# Patient Record
Sex: Female | Born: 1950 | Race: White | Hispanic: No | Marital: Married | Smoking: Never smoker
Health system: Southern US, Community
[De-identification: ages and names within clinical notes are randomized; demographics above are authoritative.]

## PROBLEM LIST (undated history)

## (undated) DIAGNOSIS — E669 Obesity, unspecified: Secondary | ICD-10-CM

## (undated) DIAGNOSIS — J45909 Unspecified asthma, uncomplicated: Secondary | ICD-10-CM

## (undated) DIAGNOSIS — R011 Cardiac murmur, unspecified: Secondary | ICD-10-CM

## (undated) DIAGNOSIS — I10 Essential (primary) hypertension: Secondary | ICD-10-CM

## (undated) DIAGNOSIS — R809 Proteinuria, unspecified: Secondary | ICD-10-CM

## (undated) HISTORY — PX: WISDOM TOOTH EXTRACTION: SHX21

## (undated) HISTORY — PX: BREAST EXCISIONAL BIOPSY: SUR124

## (undated) HISTORY — PX: BUNIONECTOMY: SHX129

## (undated) HISTORY — DX: Proteinuria, unspecified: R80.9

## (undated) HISTORY — DX: Obesity, unspecified: E66.9

## (undated) HISTORY — DX: Cardiac murmur, unspecified: R01.1

## (undated) HISTORY — DX: Essential (primary) hypertension: I10

## (undated) HISTORY — DX: Unspecified asthma, uncomplicated: J45.909

---

## 1992-02-11 HISTORY — PX: FRACTURE SURGERY: SHX138

## 1997-02-10 HISTORY — PX: EYE SURGERY: SHX253

## 2001-02-10 HISTORY — PX: ELBOW SURGERY: SHX618

## 2006-10-06 ENCOUNTER — Encounter: Admission: RE | Admit: 2006-10-06 | Discharge: 2006-10-06 | Payer: Self-pay | Admitting: Emergency Medicine

## 2007-07-23 ENCOUNTER — Emergency Department (HOSPITAL_COMMUNITY): Admission: EM | Admit: 2007-07-23 | Discharge: 2007-07-23 | Payer: Self-pay | Admitting: Emergency Medicine

## 2007-08-18 ENCOUNTER — Ambulatory Visit: Payer: Self-pay | Admitting: Cardiology

## 2007-08-26 ENCOUNTER — Ambulatory Visit: Payer: Self-pay

## 2007-08-26 ENCOUNTER — Encounter: Payer: Self-pay | Admitting: Cardiology

## 2008-06-26 ENCOUNTER — Encounter (INDEPENDENT_AMBULATORY_CARE_PROVIDER_SITE_OTHER): Payer: Self-pay | Admitting: *Deleted

## 2008-09-19 ENCOUNTER — Encounter: Payer: Self-pay | Admitting: Cardiology

## 2008-09-19 ENCOUNTER — Ambulatory Visit: Payer: Self-pay

## 2008-10-25 DIAGNOSIS — R011 Cardiac murmur, unspecified: Secondary | ICD-10-CM | POA: Insufficient documentation

## 2008-10-25 DIAGNOSIS — J45909 Unspecified asthma, uncomplicated: Secondary | ICD-10-CM | POA: Insufficient documentation

## 2008-10-25 DIAGNOSIS — R079 Chest pain, unspecified: Secondary | ICD-10-CM | POA: Insufficient documentation

## 2008-10-25 DIAGNOSIS — E785 Hyperlipidemia, unspecified: Secondary | ICD-10-CM | POA: Insufficient documentation

## 2008-10-25 DIAGNOSIS — E1169 Type 2 diabetes mellitus with other specified complication: Secondary | ICD-10-CM | POA: Insufficient documentation

## 2008-10-25 DIAGNOSIS — K219 Gastro-esophageal reflux disease without esophagitis: Secondary | ICD-10-CM | POA: Insufficient documentation

## 2008-10-25 DIAGNOSIS — I10 Essential (primary) hypertension: Secondary | ICD-10-CM | POA: Insufficient documentation

## 2008-10-26 ENCOUNTER — Ambulatory Visit: Payer: Self-pay | Admitting: Cardiology

## 2008-10-26 DIAGNOSIS — I359 Nonrheumatic aortic valve disorder, unspecified: Secondary | ICD-10-CM | POA: Insufficient documentation

## 2009-08-29 ENCOUNTER — Encounter (INDEPENDENT_AMBULATORY_CARE_PROVIDER_SITE_OTHER): Payer: Self-pay | Admitting: *Deleted

## 2009-09-06 ENCOUNTER — Encounter (INDEPENDENT_AMBULATORY_CARE_PROVIDER_SITE_OTHER): Payer: Self-pay | Admitting: *Deleted

## 2009-09-10 ENCOUNTER — Ambulatory Visit: Payer: Self-pay | Admitting: Gastroenterology

## 2009-09-13 ENCOUNTER — Encounter: Admission: RE | Admit: 2009-09-13 | Discharge: 2009-09-13 | Payer: Self-pay | Admitting: Emergency Medicine

## 2009-09-19 ENCOUNTER — Encounter: Admission: RE | Admit: 2009-09-19 | Discharge: 2009-09-19 | Payer: Self-pay | Admitting: Emergency Medicine

## 2009-09-21 ENCOUNTER — Ambulatory Visit: Payer: Self-pay | Admitting: Gastroenterology

## 2009-09-21 LAB — HM COLONOSCOPY

## 2009-11-05 ENCOUNTER — Encounter: Admission: RE | Admit: 2009-11-05 | Discharge: 2009-11-05 | Payer: Self-pay | Admitting: Surgery

## 2009-11-05 ENCOUNTER — Ambulatory Visit (HOSPITAL_COMMUNITY): Admission: RE | Admit: 2009-11-05 | Discharge: 2009-11-05 | Payer: Self-pay | Admitting: Surgery

## 2010-03-14 NOTE — Miscellaneous (Signed)
Summary: [facility]  Clinical Lists Changes  Medications: Added new medication of MOVIPREP 100 GM  SOLR (PEG-KCL-NACL-NASULF-NA ASC-C) As per prep instructions. - Signed Rx of MOVIPREP 100 GM  SOLR (PEG-KCL-NACL-NASULF-NA ASC-C) As per prep instructions.;  #1 x 0;  Signed;  Entered by: Ezra Sites RN;  Authorized by: Mardella Layman MD [facility];  Method used: Electronically to [facility]. [phone]*, [address], [place], [place], [place]  [address], Ph: [phone], Fax: [phone] Allergies: Added new allergy or adverse reaction of CODEINE Observations: Added new observation of NKA: F (09/10/2009 12:42)    Prescriptions: MOVIPREP 100 GM  SOLR (PEG-KCL-NACL-NASULF-NA ASC-C) As per prep instructions.  #1 x 0   Entered by:   Ezra Sites RN   Authorized by:   Mardella Layman MD [facility]   Signed by:   Ezra Sites RN on 09/10/2009   Method used:   Electronically to        [facility]. [phone]* (retail)       [address]       [place], [place]  [address]       Ph: [phone]       Fax: [phone]   RxID:   [id number]

## 2010-03-14 NOTE — Letter (Signed)
Summary: Previsit letter  [facility]  [address] [place], [place] [address]   Phone: [phone]  Fax: [phone]       08/29/2009 MRN: [id number]  [facility] [address] [place], [place]  [address]  Dear Ms. Hagan,  Welcome to the Gastroenterology Division at [facility].    You are scheduled to see a nurse for your pre-procedure visit on 09/10/2009 at 1:00PM on the 3rd floor at [facility], [address]  We ask that you try to arrive at our office 15 minutes prior to your appointment time to allow for check-in.  Your nurse visit will consist of discussing your medical and surgical history, your immediate family medical history, and your medications.    Please bring a complete list of all your medications or, if you prefer, bring the medication bottles and we will list them.  We will need to be aware of both prescribed and over the counter drugs.  We will need to know exact dosage information as well.  If you are on blood thinners (Coumadin, Plavix, Aggrenox, Ticlid, etc.) please call our office today/prior to your appointment, as we need to consult with your physician about holding your medication.   Please be prepared to read and sign documents such as consent forms, a financial agreement, and acknowledgement forms.  If necessary, and with your consent, a friend or relative is welcome to sit-in on the nurse visit with you.  Please bring your insurance card so that we may make a copy of it.  If your insurance requires a referral to see a specialist, please bring your referral form from your primary care physician.  No co-pay is required for this nurse visit.     If you cannot keep your appointment, please call [phone] to cancel or reschedule prior to your appointment date.  This allows [place] the opportunity to schedule an appointment for another patient in need of care.    Thank you for choosing [facility] for your medical needs.  We  appreciate the opportunity to care for you.  Please visit [place] at our website  to learn more about our practice.                     Sincerely.                                                                                                                   The Gastroenterology Division

## 2010-03-14 NOTE — Letter (Signed)
Summary: [facility] Instructions  [facility]  [address] [place], [place] [address]   Phone: [phone]  Fax: [phone]       [facility]    1950/10/27    MRN: [id number]        Procedure Day /Date:  Friday 09/21/2009     Arrival Time: 1:00 pm      Procedure Time: 2:00 pm     Location of Procedure:                    _x _  [facility] (4th Floor)                        PREPARATION FOR COLONOSCOPY WITH MOVIPREP   Starting 5 days prior to your procedure Sunday 8/7  do not eat nuts, seeds, popcorn, corn, beans, peas,  salads, or any raw vegetables.  Do not take any fiber supplements (e.g. Metamucil, Citrucel, and Benefiber).  THE DAY BEFORE YOUR PROCEDURE         DATE: Thursday 8/11  1.  Drink clear liquids the entire day-NO SOLID FOOD  2.  Do not drink anything colored red or purple.  Avoid juices with pulp.  No orange juice.  3.  Drink at least 64 oz. (8 glasses) of fluid/clear liquids during the day to prevent dehydration and help the prep work efficiently.  CLEAR LIQUIDS INCLUDE: Water Jello Ice Popsicles Tea (sugar ok, no milk/cream) Powdered fruit flavored drinks Coffee (sugar ok, no milk/cream) Gatorade Juice: apple, white grape, white cranberry  Lemonade Clear bullion, consomm, broth Carbonated beverages (any kind) Strained chicken noodle soup Hard Candy                             4.  In the morning, mix first dose of MoviPrep solution:    Empty 1 Pouch A and 1 Pouch B into the disposable container    Add lukewarm drinking water to the top line of the container. Mix to dissolve    Refrigerate (mixed solution should be used within 24 hrs)  5.  Begin drinking the prep at 5:00 p.m. The MoviPrep container is divided by 4 marks.   Every 15 minutes drink the solution down to the next mark (approximately 8 oz) until the full liter is complete.   6.  Follow completed prep with 16 oz of clear liquid of your choice (Nothing red  or purple).  Continue to drink clear liquids until bedtime.  7.  Before going to bed, mix second dose of MoviPrep solution:    Empty 1 Pouch A and 1 Pouch B into the disposable container    Add lukewarm drinking water to the top line of the container. Mix to dissolve    Refrigerate  THE DAY OF YOUR PROCEDURE      DATE: Friday 8/12  Beginning at 9:00 a.m. (5 hours before procedure):         1. Every 15 minutes, drink the solution down to the next mark (approx 8 oz) until the full liter is complete.  2. Follow completed prep with 16 oz. of clear liquid of your choice.    3. You may drink clear liquids until 12:00 pm (2 HOURS BEFORE PROCEDURE).   MEDICATION INSTRUCTIONS  Unless otherwise instructed, you should take regular prescription medications with a small sip of water   as early as possible the morning  of your procedure.   Additional medication instructions:  Do not take Atenolol/HCTZ day of procedure.         OTHER INSTRUCTIONS  You will need a responsible adult at least 58 years of age to accompany you and drive you home.   This person must remain in the waiting room during your procedure.  Wear loose fitting clothing that is easily removed.  Leave jewelry and other valuables at home.  However, you may wish to bring a book to read or  an iPod/MP3 player to listen to music as you wait for your procedure to start.  Remove all body piercing jewelry and leave at home.  Total time from sign-in until discharge is approximately 2-3 hours.  You should go home directly after your procedure and rest.  You can resume normal activities the  day after your procedure.  The day of your procedure you should not:   Drive   Make legal decisions   Operate machinery   Drink alcohol   Return to work  You will receive specific instructions about eating, activities and medications before you leave.    The above instructions have been reviewed and explained to me by    Ezra Sites RN  September 10, 2009 1:05 PM    I fully understand and can verbalize these instructions _____________________________ Date _________

## 2010-03-14 NOTE — Procedures (Signed)
Summary: Colonoscopy  Patient: Randy Hainline Note: All result statuses are Final unless otherwise noted.  Tests: (1) Colonoscopy (COL)   COL Colonoscopy           DONE     [facility]     [address]     [place], [place]  [address]           COLONOSCOPY PROCEDURE REPORT           PATIENT:  Monette, Arleatha  MR#:  [id number]     BIRTHDATE:  [birth year], 59 yrs. old  GENDER:  female     ENDOSCOPIST:  Vania Rea. Jarold Motto, MD, [facility]     REF. BY:  Robert Bellow, M.D.     PROCEDURE DATE:  09/21/2009     PROCEDURE:  Average-risk screening colonoscopy     G0121     ASA CLASS:  Class II     INDICATIONS:  Routine Risk Screening     MEDICATIONS:   Fentanyl 75 mcg IV, Versed 9 mg IV           DESCRIPTION OF PROCEDURE:   After the risks benefits and     alternatives of the procedure were thoroughly explained, informed     consent was obtained.  Digital rectal exam was performed and     revealed no abnormalities.   The LB CF-H180AL [id number] endoscope     was introduced through the anus and advanced to the cecum, which     was identified by both the appendix and ileocecal valve, without     limitations.  The quality of the prep was excellent, using     MoviPrep.  The instrument was then slowly withdrawn as the colon     was fully examined.     <<PROCEDUREIMAGES>>           FINDINGS:  There were mild diverticular changes in left colon.     diverticulosis was found.  No polyps or cancers were seen.  This     was otherwise a normal examination of the colon.   Retroflexed     views in the rectum revealed no abnormalities.    The scope was     then withdrawn from the patient and the procedure completed.           COMPLICATIONS:  None     ENDOSCOPIC IMPRESSION:     1) Diverticulosis,mild,left sided diverticulosis     2) No polyps or cancers     3) Otherwise normal examination     RECOMMENDATIONS:     1) high fiber diet     2) Continue current colorectal screening recommendations for  "routine risk" patients with a repeat colonoscopy in 10 years.     REPEAT EXAM:  No           ______________________________     Vania Rea. Jarold Motto, MD, [facility]           CC:           n.     eSIGNED:   Vania Rea. Patterson at 09/21/2009 02:44 PM           Arenas, Saidah, [place] [id number]  Note: An exclamation mark (!) indicates a result that was not dispersed into the flowsheet. Document Creation Date: 09/21/2009 2:45 PM _______________________________________________________________________  (1) Order result status: Final Collection or observation date-time: 09/21/2009 14:40 Requested date-time:  Receipt date-time:  Reported date-time:  Referring Physician:   Ordering Physician: Sheryn Bison [phone]) Specimen  Source:  Source: Launa Grill Order Number: [id number] Lab site:   Appended Document: Colonoscopy    Clinical Lists Changes  Observations: Added new observation of COLONNXTDUE: 09/2019 (09/21/2009 16:12)

## 2010-04-25 LAB — SURGICAL PCR SCREEN: MRSA, PCR: NEGATIVE

## 2010-04-25 LAB — DIFFERENTIAL
Eosinophils Relative: 1 % (ref 0–5)
Lymphocytes Relative: 25 % (ref 12–46)
Lymphs Abs: 2.4 10*3/uL (ref 0.7–4.0)
Monocytes Absolute: 0.9 10*3/uL (ref 0.1–1.0)
Monocytes Relative: 9 % (ref 3–12)

## 2010-04-25 LAB — BASIC METABOLIC PANEL
BUN: 17 mg/dL (ref 6–23)
CO2: 30 mEq/L (ref 19–32)
Calcium: 10 mg/dL (ref 8.4–10.5)
GFR calc Af Amer: >60 mL/min (ref >60)
GFR calc non Af Amer: >60 mL/min (ref >60)
Glucose, Bld: 137 mg/dL — ABNORMAL HIGH (ref 70–99)
Potassium: 3.6 mEq/L (ref 3.5–5.1)
Sodium: 142 mEq/L (ref 135–145)

## 2010-04-25 LAB — CBC
HCT: 41.8 % (ref 36.0–46.0)
Hemoglobin: 13.7 g/dL (ref 12.0–15.0)
MCH: 30 pg (ref 26.0–34.0)
WBC: 9.8 10*3/uL (ref 4.0–10.5)

## 2010-06-25 NOTE — Assessment & Plan Note (Signed)
[facility]                            CARDIOLOGY OFFICE NOTE   NAME:Kennedy, Tracy ZAVALETA                         MRN:          [id number]  DATE:08/18/2007                            DOB:          12-01-1950    HISTORY:  The patient is a 60 year old female whom I am asked to  evaluate for chest pain.  She has no prior cardiac history.  She does  have a long history of indigestion by her report.  She was seen in the  [facility] emergency room on July 23, 2007.  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  The pain was  substernal in location.  Note, it had originally began 2 days earlier in  the right breast area and radiated over to the substernal area.  It was  described as between a sharp and a dull sensation.  It did not radiate.  It was not pleuritic or positional nor is it related to food.  There was  a question of increase with exertion.  There was no associated nausea,  vomiting, shortness of breath, or diaphoresis.  It worsened on the day  that she was seen in the emergency room and she therefore presented to  the hospital.  At that time, her electrocardiogram showed a sinus rhythm  with no ST changes.  She had chest x-ray that showed no acute disease.  She had a set of cardiac markers that were normal.  Note, her hemoglobin  was normal at 13.1.  She had a brief episode of chest pain since then.  However, she recently traveled to the [facility] and was walking and  did not have significant chest pain.  Note, she occasionally has dyspnea  on exertion, but there is no orthopnea, PND, or pedal edema.  She has  not had palpitations or syncope.  Because of the above, we were asked to  further evaluate.   MEDICATIONS:  1. Potassium 20 mEq p.o. b.i.d.  2. Atenolol.  3. Chlorthalidone 50/25 mg tablets 1 p.o. daily.  4. Lisinopril 20 mg p.o. daily.  5. Lipitor 20 mg p.o. daily.   ALLERGIES:  CODEINE.   Note, she also uses  Ventolin as needed.   SOCIAL HISTORY:  She does not smoke and only rarely consumes alcohol.   FAMILY HISTORY:  Negative for coronary artery disease in her immediate  family.  Her mother did have congestive heart failure.   PAST MEDICAL HISTORY:  Significant for:  1. Hypertension.  2. Hyperlipidemia.  3. She states she was told she had borderline diabetes in the past,      but not recently.  4. She has a history of asthma.  5. There is also a history of reflux.  6. She has had prior surgery on her elbow and ankle.  7. She also has a history of allergies.   REVIEW OF SYSTEMS:  She denies any headaches, fevers, or chills.  There  is no productive cough or hemoptysis.  There is no dysphagia,  odynophagia, melena, or hematochezia.  There is no dysuria or hematuria.  There are no rashes or seizure activity.  There is no orthopnea, PND, or  pedal edema.  The remaining systems are negative.   PHYSICAL EXAMINATION:  VITAL SIGNS:  Today, shows a blood pressure of  149/85 and pulse is 69.  She weighs 215 pounds.  GENERAL:  She is well developed and obese.  She is in no acute distress  at present.  She does not appear to be depressed.  SKIN:  Warm and dry.  BACK:  Normal.  HEENT:  Normal with normal eyelids.  NECK:  Supple with a normal upstroke bilaterally and could not  appreciate bruits.  There is no jugular venous distention and no  thyromegaly.  CHEST:  Clear to auscultation with normal expansion.  CARDIOVASCULAR:  Regular rate and rhythm with normal S1 and S2.  There  is a 2/6 systolic murmur at the left sternal border.  There is no change  with Valsalva.  ABDOMEN:  Nontender and nondistended.  Positive bowel sounds.  No  hepatosplenomegaly and no masses appreciated.  There is no abdominal  bruit.  EXTREMITIES:  She has 2+ femoral pulses bilaterally and no bruits.  Extremities show no edema, and I can palpate no cords.  She has 2+  posterior tibial pulses bilaterally.  There is no  peripheral clubbing.  NEUROLOGIC:  Grossly intact.   Her electrocardiogram shows a sinus rhythm at a rate of 71.  The axis is  normal.  There are no significant ST changes.   DIAGNOSES:  1. Atypical chest pain - Mrs. Eggebrecht's symptoms are somewhat atypical.      Certainly, they could be related to reflux.  Apparently, she has a      long history of this.  However, she also has risk factors and we      will plan to proceed with a stress Myoview.  If it shows no      ischemia then we will not pursue further evaluation at this point.      She may need Nexium or Protonix in the future, but I will leave      this to her primary care physician.  2. Murmur - we will check an echocardiogram to rule out aortic      stenosis.  This sounds potentially be an ejection murmur.  3. Hypertension - blood pressure is mildly elevated today.  She will      track this and her primary care physician can increase her      medications as indicated.  4. Hyperlipidemia - she will follow up with her primary care physician      for this.  5. History of asthma.   I will see her back on an as needed basis pending the results of her  above studies.     Madolyn Frieze Jens Som, MD, [facility]  Electronically Signed    BSC/MedQ  DD: 08/18/2007  DT: 08/19/2007  Job #: [id number]   cc:   Emergency Room At [facility]

## 2010-08-07 ENCOUNTER — Telehealth: Payer: Self-pay | Admitting: Cardiology

## 2010-08-07 NOTE — Telephone Encounter (Signed)
All Cardiac faxed to [facility] @ [phone]  08/07/10/km

## 2010-11-07 LAB — BASIC METABOLIC PANEL WITH GFR
BUN: 14
Calcium: 9.2
Creatinine, Ser: 0.62
GFR calc Af Amer: >60
GFR calc non Af Amer: >60
Glucose, Bld: 82
Potassium: 3.5
Sodium: 142

## 2010-11-07 LAB — DIFFERENTIAL
Basophils Absolute: 0
Basophils Relative: 1
Eosinophils Absolute: 0.2
Eosinophils Relative: 2
Lymphocytes Relative: 32
Lymphs Abs: 2.9
Monocytes Absolute: 0.8
Monocytes Relative: 8
Neutro Abs: 5.2
Neutrophils Relative %: 57

## 2010-11-07 LAB — CBC
HCT: 38.7
Hemoglobin: 13.1
MCHC: 33.7
MCV: 90.2
Platelets: 262
RBC: 4.29
RDW: 13.2
WBC: 9.1

## 2010-11-07 LAB — POCT CARDIAC MARKERS
CKMB, poc: 2.7
Myoglobin, poc: 89.1
Operator id: 234501
Troponin i, poc: <0.05

## 2010-11-07 LAB — BASIC METABOLIC PANEL
CO2: 27
Chloride: 104

## 2011-02-11 HISTORY — PX: EYE SURGERY: SHX253

## 2011-02-25 ENCOUNTER — Encounter: Payer: Self-pay | Admitting: Physician Assistant

## 2011-02-25 DIAGNOSIS — I10 Essential (primary) hypertension: Secondary | ICD-10-CM | POA: Insufficient documentation

## 2011-02-25 DIAGNOSIS — E1129 Type 2 diabetes mellitus with other diabetic kidney complication: Secondary | ICD-10-CM | POA: Insufficient documentation

## 2011-02-25 DIAGNOSIS — J45909 Unspecified asthma, uncomplicated: Secondary | ICD-10-CM | POA: Insufficient documentation

## 2011-04-08 ENCOUNTER — Ambulatory Visit (INDEPENDENT_AMBULATORY_CARE_PROVIDER_SITE_OTHER): Payer: BC Managed Care – PPO | Admitting: Physician Assistant

## 2011-04-08 ENCOUNTER — Encounter: Payer: Self-pay | Admitting: Physician Assistant

## 2011-04-08 DIAGNOSIS — E119 Type 2 diabetes mellitus without complications: Secondary | ICD-10-CM

## 2011-04-08 DIAGNOSIS — I10 Essential (primary) hypertension: Secondary | ICD-10-CM

## 2011-04-08 DIAGNOSIS — E669 Obesity, unspecified: Secondary | ICD-10-CM

## 2011-04-08 DIAGNOSIS — E785 Hyperlipidemia, unspecified: Secondary | ICD-10-CM

## 2011-04-08 DIAGNOSIS — E782 Mixed hyperlipidemia: Secondary | ICD-10-CM

## 2011-04-08 DIAGNOSIS — Z79899 Other long term (current) drug therapy: Secondary | ICD-10-CM

## 2011-04-08 LAB — COMPREHENSIVE METABOLIC PANEL
ALT: 25 U/L (ref 0–35)
CO2: 27 mEq/L (ref 19–32)
Calcium: 9.9 mg/dL (ref 8.4–10.5)
Chloride: 105 mEq/L (ref 96–112)
Creat: 0.64 mg/dL (ref 0.50–1.10)
Total Protein: 7.2 g/dL (ref 6.0–8.3)

## 2011-04-08 LAB — GLUCOSE, POCT (MANUAL RESULT ENTRY): POC Glucose: 115

## 2011-04-08 LAB — LIPID PANEL
Cholesterol: 168 mg/dL (ref 0–200)
LDL Cholesterol: 100 mg/dL — ABNORMAL HIGH (ref 0–99)
Triglycerides: 122 mg/dL (ref <150)

## 2011-04-08 MED ORDER — LOVASTATIN 40 MG PO TABS: 40.0000 mg | ORAL_TABLET | Freq: Every day | ORAL | Status: DC

## 2011-04-08 MED ORDER — VERAPAMIL HCL ER 360 MG PO CP24: 360.0000 mg | ORAL_CAPSULE | Freq: Every day | ORAL | Status: DC

## 2011-04-08 MED ORDER — POTASSIUM CHLORIDE CRYS ER 20 MEQ PO TBCR: 20.0000 meq | EXTENDED_RELEASE_TABLET | Freq: Every day | ORAL | Status: DC

## 2011-04-08 NOTE — Assessment & Plan Note (Signed)
Excellent control.  Continue current treatment and healthy lifestyle efforts!

## 2011-04-08 NOTE — Assessment & Plan Note (Signed)
Continue current treatment, as well as efforts for weight loss through healthy eating and regular exercise.  As BP improves, will reduce, then D/C Verapamil, if able.

## 2011-04-08 NOTE — Assessment & Plan Note (Signed)
Healthy eating and regular exercise. 

## 2011-04-08 NOTE — Patient Instructions (Signed)
Keep working on healthy eating and increasing your exercise.  In the event that you need to be admitted to the hospital: At [facility] Teaching Service At [facility]

## 2011-04-08 NOTE — Progress Notes (Signed)
Subjective:    Patient ID: Tracy Kennedy, female    DOB: [birth year], 61 y.o.   MRN: [id number]  HPI Patient presents for follow-up of DM type 2, HTN, hyperlipidemia and obesity.  Home BS tiw 100-120 fasting Home BP 110's/60's Wants to reduce meds, and is working on [employer]. Sees dentist  q 6mos Eye exam q 12 months (lasik eye surgery in 2009)  Reduced exercise with recent left foot surgery for bunion-feeling hip achiness and leg weakness. Right bunionectomy planned for June.  Review of Systems No chest pain, SOB, HA, dizziness, vision change, N/V, diarrhea, dysuria, myalgias or rash.     Objective:   Physical Exam  Constitutional: She is oriented to person, place, and time. Vital signs are normal. She appears well-developed and well-nourished. No distress.  HENT:  Head: Normocephalic and atraumatic.  Right Ear: Hearing normal.  Left Ear: Hearing normal.  Eyes: EOM are normal. Pupils are equal, round, and reactive to light.  Neck: Normal range of motion. Neck supple. No thyromegaly present.  Cardiovascular: Normal rate, regular rhythm and normal heart sounds.   Pulses:      Radial pulses are 2+ on the right side, and 2+ on the left side.       Dorsalis pedis pulses are 2+ on the right side, and 2+ on the left side.       Posterior tibial pulses are 2+ on the right side, and 2+ on the left side.  Pulmonary/Chest: Effort normal and breath sounds normal.  Lymphadenopathy:       Head (right side): No tonsillar, no preauricular, no posterior auricular and no occipital adenopathy present.       Head (left side): No tonsillar, no preauricular, no posterior auricular and no occipital adenopathy present.    She has no cervical adenopathy.       Right: No supraclavicular adenopathy present.       Left: No supraclavicular adenopathy present.  Neurological: She is alert and oriented to person, place, and time. No sensory deficit.  Skin: Skin is warm, dry and intact. No  rash noted. No cyanosis or erythema. Nails show no clubbing.  Psychiatric: She has a normal mood and affect.   Results for orders placed in visit on 04/08/11  GLUCOSE, POCT (MANUAL RESULT ENTRY)      Component Value Range   POC Glucose 115    POCT GLYCOSYLATED HEMOGLOBIN (HGB A1C)      Component Value Range   Hemoglobin A1C 5.5    LIPID PANEL      Component Value Range   Cholesterol 168  0 - 200 (mg/dL)   Triglycerides 147  <829 (mg/dL)   HDL 44  >56 (mg/dL)   Total CHOL/HDL Ratio 3.8     VLDL 24  0 - 40 (mg/dL)   LDL Cholesterol 213 (*) 0 - 99 (mg/dL)  COMPREHENSIVE METABOLIC PANEL      Component Value Range   Sodium 141  135 - 145 (mEq/L)   Potassium 3.6  3.5 - 5.3 (mEq/L)   Chloride 105  96 - 112 (mEq/L)   CO2 27  19 - 32 (mEq/L)   Glucose, Bld 106 (*) 70 - 99 (mg/dL)   BUN 17  6 - 23 (mg/dL)   Creat 0.86  5.78 - 4.69 (mg/dL)   Total Bilirubin 0.4  0.3 - 1.2 (mg/dL)   Alkaline Phosphatase 51  39 - 117 (U/L)   AST 21  0 - 37 (U/L)   ALT  25  0 - 35 (U/L)   Total Protein 7.2  6.0 - 8.3 (g/dL)   Albumin 4.3  3.5 - 5.2 (g/dL)   Calcium 9.9  8.4 - 41.3 (mg/dL)   CMET, lipids pending.       Assessment & Plan:

## 2011-04-08 NOTE — Assessment & Plan Note (Signed)
Continue efforts for weight loss through healthy eating and regular exercise. 

## 2011-04-09 ENCOUNTER — Encounter: Payer: Self-pay | Admitting: Physician Assistant

## 2011-07-01 ENCOUNTER — Encounter: Payer: Self-pay | Admitting: Physician Assistant

## 2011-07-01 ENCOUNTER — Ambulatory Visit (INDEPENDENT_AMBULATORY_CARE_PROVIDER_SITE_OTHER): Payer: BC Managed Care – PPO | Admitting: Physician Assistant

## 2011-07-01 VITALS — BP 123/73 | HR 49 | Temp 98.2°F | Resp 16 | Ht 62.0 in | Wt 208.8 lb

## 2011-07-01 DIAGNOSIS — K2100 Gastro-esophageal reflux disease with esophagitis, without bleeding: Secondary | ICD-10-CM

## 2011-07-01 DIAGNOSIS — I10 Essential (primary) hypertension: Secondary | ICD-10-CM

## 2011-07-01 DIAGNOSIS — E119 Type 2 diabetes mellitus without complications: Secondary | ICD-10-CM

## 2011-07-01 DIAGNOSIS — E785 Hyperlipidemia, unspecified: Secondary | ICD-10-CM

## 2011-07-01 LAB — COMPREHENSIVE METABOLIC PANEL
ALT: 20 U/L (ref 0–35)
AST: 16 U/L (ref 0–37)
Albumin: 4.2 g/dL (ref 3.5–5.2)
Alkaline Phosphatase: 53 U/L (ref 39–117)
BUN: 20 mg/dL (ref 6–23)
CO2: 30 mEq/L (ref 19–32)
Calcium: 9.5 mg/dL (ref 8.4–10.5)
Chloride: 103 mEq/L (ref 96–112)
Creat: 0.58 mg/dL (ref 0.50–1.10)
Glucose, Bld: 96 mg/dL (ref 70–99)
Potassium: 3.5 mEq/L (ref 3.5–5.3)
Sodium: 141 mEq/L (ref 135–145)
Total Bilirubin: 0.4 mg/dL (ref 0.3–1.2)
Total Protein: 7 g/dL (ref 6.0–8.3)

## 2011-07-01 LAB — POCT GLYCOSYLATED HEMOGLOBIN (HGB A1C): Hemoglobin A1C: 5.9

## 2011-07-01 LAB — GLUCOSE, POCT (MANUAL RESULT ENTRY): POC Glucose: 112 mg/dl — AB (ref 70–99)

## 2011-07-01 MED ORDER — POTASSIUM CHLORIDE CRYS ER 20 MEQ PO TBCR: 20.0000 meq | EXTENDED_RELEASE_TABLET | Freq: Every day | ORAL | Status: DC

## 2011-07-01 MED ORDER — LOVASTATIN 40 MG PO TABS: 40.0000 mg | ORAL_TABLET | Freq: Every day | ORAL | Status: DC

## 2011-07-01 MED ORDER — VERAPAMIL HCL ER 360 MG PO CP24: 360.0000 mg | ORAL_CAPSULE | Freq: Every day | ORAL | Status: DC

## 2011-07-01 MED ORDER — OMEPRAZOLE 20 MG PO CPDR: 20.0000 mg | DELAYED_RELEASE_CAPSULE | Freq: Every day | ORAL | Status: DC

## 2011-07-01 MED ORDER — ATENOLOL-CHLORTHALIDONE 50-25 MG PO TABS: 1.0000 | ORAL_TABLET | Freq: Every day | ORAL | Status: DC

## 2011-07-01 MED ORDER — LISINOPRIL 40 MG PO TABS: 40.0000 mg | ORAL_TABLET | Freq: Every day | ORAL | Status: DC

## 2011-07-01 NOTE — Progress Notes (Signed)
  Subjective:    Patient ID: Tracy Kennedy, female    DOB: 01/02/1951, 61 y.o.   MRN: [id number]  HPI Patient presents for follow up of DM type II, hypertension, and hyperlipidemia. States she is doing well with current medications. Fasting BS <126 and home BP <130/80.  Scheduled for cataract surgery in this June as well as right bunionectomy 1 week later.  Working on [employer] and exercise. Plans to increase walking after bunion removed. Excited about new diabetes cook book.     Review of Systems  Constitutional: Negative for fever and chills.  Respiratory: Negative for chest tightness and shortness of breath.   Gastrointestinal: Negative for nausea and vomiting.  Genitourinary: Negative for dysuria.  Musculoskeletal: Negative for myalgias.  Skin: Negative for rash.  Neurological: Negative for headaches.       Objective:   Physical Exam  Constitutional: She is oriented to person, place, and time. She appears well-developed and well-nourished.  HENT:  Head: Normocephalic and atraumatic.  Right Ear: External ear normal.  Left Ear: External ear normal.  Eyes: Conjunctivae are normal.  Neck: Neck supple.  Cardiovascular: Normal rate and regular rhythm.   Pulmonary/Chest: Effort normal and breath sounds normal.  Lymphadenopathy:    She has no cervical adenopathy.  Neurological: She is alert and oriented to person, place, and time.  Psychiatric: She has a normal mood and affect. Her behavior is normal. Judgment and thought content normal.          Assessment & Plan:   1. Hypertension, benign  Continue current treatment plan Increase exercise   2. Type II or unspecified type diabetes mellitus without mention of complication, not stated as uncontrolled  POCT glucose (manual entry), POCT glycosylated hemoglobin (Hb A1C), Comprehensive metabolic panel  3. Hyperlipidemia  Recheck lipids at next visit   4. Reflux esophagitis  Patient reports using OTC omeprazole. Will see if  rx is more affordable.  omeprazole (PRILOSEC) 20 MG capsule

## 2011-07-01 NOTE — Progress Notes (Signed)
Patient seen and Precepted with Ms. Marte, PA-C and agree.  

## 2011-07-03 ENCOUNTER — Encounter: Payer: Self-pay | Admitting: Physician Assistant

## 2011-09-29 NOTE — Progress Notes (Signed)
Completed prior auth for pt's omeprazole 20 over the phone and received approval. Faxed notification to pharmacy.

## 2011-10-07 ENCOUNTER — Ambulatory Visit (INDEPENDENT_AMBULATORY_CARE_PROVIDER_SITE_OTHER): Payer: BC Managed Care – PPO | Admitting: Physician Assistant

## 2011-10-07 ENCOUNTER — Encounter: Payer: Self-pay | Admitting: Physician Assistant

## 2011-10-07 VITALS — BP 152/88 | HR 55 | Temp 98.3°F | Resp 18 | Ht 63.5 in | Wt 213.0 lb

## 2011-10-07 DIAGNOSIS — K219 Gastro-esophageal reflux disease without esophagitis: Secondary | ICD-10-CM

## 2011-10-07 DIAGNOSIS — R809 Proteinuria, unspecified: Secondary | ICD-10-CM

## 2011-10-07 DIAGNOSIS — M79609 Pain in unspecified limb: Secondary | ICD-10-CM

## 2011-10-07 DIAGNOSIS — E785 Hyperlipidemia, unspecified: Secondary | ICD-10-CM

## 2011-10-07 DIAGNOSIS — I10 Essential (primary) hypertension: Secondary | ICD-10-CM

## 2011-10-07 DIAGNOSIS — M79643 Pain in unspecified hand: Secondary | ICD-10-CM

## 2011-10-07 DIAGNOSIS — E119 Type 2 diabetes mellitus without complications: Secondary | ICD-10-CM

## 2011-10-07 LAB — LIPID PANEL
Cholesterol: 214 mg/dL — ABNORMAL HIGH (ref 0–200)
Total CHOL/HDL Ratio: 3.9 Ratio

## 2011-10-07 LAB — COMPREHENSIVE METABOLIC PANEL
ALT: 25 U/L (ref 0–35)
AST: 22 U/L (ref 0–37)
Albumin: 4.2 g/dL (ref 3.5–5.2)
CO2: 30 mEq/L (ref 19–32)
Calcium: 10.1 mg/dL (ref 8.4–10.5)
Chloride: 102 mEq/L (ref 96–112)
Creat: 0.63 mg/dL (ref 0.50–1.10)
Potassium: 3.6 mEq/L (ref 3.5–5.3)
Sodium: 141 mEq/L (ref 135–145)
Total Protein: 7.1 g/dL (ref 6.0–8.3)

## 2011-10-07 LAB — GLUCOSE, POCT (MANUAL RESULT ENTRY): POC Glucose: 105 mg/dl — AB (ref 70–99)

## 2011-10-07 LAB — POCT GLYCOSYLATED HEMOGLOBIN (HGB A1C): Hemoglobin A1C: 5.9

## 2011-10-07 MED ORDER — LOVASTATIN 40 MG PO TABS: 40.0000 mg | ORAL_TABLET | Freq: Every day | ORAL | Status: DC

## 2011-10-07 NOTE — Patient Instructions (Signed)
Try some acetaminophen for your hand pain.  Ibuprofen would probably work better, but will exacerbate the reflux and heartburn.  If it doesn't improve, we may want to xray them and consider physical therapy.

## 2011-10-07 NOTE — Assessment & Plan Note (Signed)
Above goal today, but well-controlled at home.

## 2011-10-07 NOTE — Assessment & Plan Note (Signed)
Good control.  Continue current treatment and efforts for healthy eating, regular exercise and weight loss.

## 2011-10-07 NOTE — Assessment & Plan Note (Signed)
Controlled on Prilosec.  Symptoms recur without it, so we agree to continue it for now.  I hope her symptoms will improve/resolve with weight loss.

## 2011-10-07 NOTE — Progress Notes (Signed)
Subjective:    Patient ID: Tracy Kennedy, female    DOB: August 11, 1950, 61 y.o.   MRN: [id number]  HPI This 61 y.o. female presents for evaluation of diabetes, HTN, lipids.  Has had three surgeries (bilateral cataracts, and bunionectomy) since her last visit.  Will have a redo on the LEFT eye. BP and glucose flucuate with reduced activity.  Having a difficult time sticking to a healthy eating plan.  Review of Systems  Checks blood sugar every few days, ranges 100-130 (fasting). Dental exam Q six months. Eye exam regularly (recent cataract repair). Current with pneumococcal and influenza vaccines.  Past Medical History  Diagnosis Date  . HTN (hypertension)   . Asthma   . Obesity   . Diabetes mellitus   . Microalbuminuria   . Cardiac murmur   . Asthma     childhood    Past Surgical History  Procedure Date  . Breast lumpectomy 10/2009    Benign  . Eye surgery 1999    Lasix  . Fracture surgery 1994    ankle  . 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 Bunionectomy 07/2010    Prior to Admission medications   Medication Sig Start Date End Date Taking? Authorizing Provider  albuterol (PROVENTIL HFA;VENTOLIN HFA) 108 (90 BASE) MCG/ACT inhaler Inhale 2 puffs into the lungs every 6 (six) hours as needed.   Yes Historical Provider, MD  aspirin 81 MG tablet Take 81 mg by mouth daily.   Yes Historical Provider, MD  atenolol-chlorthalidone (TENORETIC) 50-25 MG per tablet Take 1 tablet by mouth daily. 07/01/11  Yes Heather M Marte, PA-C  BIOTIN PO Take by mouth daily.   Yes Historical Provider, MD  CALCIUM PO Take by mouth.   Yes Historical Provider, MD  GLUCOSAMINE PO Take by mouth.   Yes Historical Provider, MD  lisinopril (PRINIVIL,ZESTRIL) 40 MG tablet Take 1 tablet (40 mg total) by mouth daily. 07/01/11  Yes Heather M Marte, PA-C  lovastatin (MEVACOR) 40 MG tablet Take 1 tablet (40 mg total) by mouth at bedtime. 07/01/11  Yes Heather Jaquita Rector, PA-C  Multiple Vitamin (MULTIVITAMIN)  tablet Take 1 tablet by mouth daily.   Yes Historical Provider, MD  omeprazole (PRILOSEC) 20 MG capsule Take 1 capsule (20 mg total) by mouth daily. 07/01/11  Yes Heather M Marte, PA-C  potassium chloride SA (K-DUR,KLOR-CON) 20 MEQ tablet Take 1 tablet (20 mEq total) by mouth daily. 07/01/11  Yes Heather M Marte, PA-C  verapamil (VERELAN PM) 360 MG 24 hr capsule Take 1 capsule (360 mg total) by mouth daily. 07/01/11  Yes Nelva Nay, PA-C    Allergies  Allergen Reactions  . Codeine     REACTION: nausea    History   Social History  . Marital Status: Significant Other    Spouse Name: Rosey Bath    Number of Children: 0  . Years of Education: 18   Occupational History  . Teacher     [employer]  .  [employer]   Social History Main Topics  . Smoking status: Never Smoker   . Smokeless tobacco: Never Used  . Alcohol Use: 0.0 - 0.5 oz/week    0-1 drink(s) per week     rare (once every 3-4 months)  . Drug Use: No  . Sexually Active: Yes -- Female partner(s)   Other Topics Concern  . Not on file   Social History Narrative   Patient is caregiver for her father who lives  next door to her.Her partner has three adult children.    Family History  Problem Relation Age of Onset  . Diabetes Father   . Heart disease Father     CABG 05/2008  . Stroke Father     06/2009       Objective:   Physical Exam  Blood pressure 152/88, pulse 55, temperature 98.3 F (36.8 C), temperature source Oral, resp. rate 18, height 5' 3.5" (1.613 m), weight 213 lb (96.616 kg), last menstrual period 01/10/2000, SpO2 94.00%. Body mass index is 37.14 kg/(m^2). Well-developed, well nourished WF who is awake, alert and oriented, in NAD. HEENT: [place]/AT, PERRL, EOMI.  Sclera and conjunctiva are clear.  EAC are patent, TMs are normal in appearance. Nasal mucosa is pink and moist. OP is clear. Neck: supple, non-tender, no lymphadenopathy, thyromegaly. Heart: RRR, no murmur Lungs: CTA Abdomen: normo-active bowel  sounds, supple, non-tender, no mass or organomegaly. Extremities: no cyanosis, clubbing or edema. Skin: warm and dry without rash.  Results for orders placed in visit on 10/07/11  GLUCOSE, POCT (MANUAL RESULT ENTRY)      Component Value Range   POC Glucose 105 (*) 70 - 99 mg/dl  POCT GLYCOSYLATED HEMOGLOBIN (HGB A1C)      Component Value Range   Hemoglobin A1C 5.9          Assessment & Plan:

## 2011-10-07 NOTE — Assessment & Plan Note (Signed)
Continue efforts for lifestyle modification and weight loss.  Continue current medications.

## 2011-10-08 ENCOUNTER — Encounter: Payer: Self-pay | Admitting: Physician Assistant

## 2011-10-08 LAB — MICROALBUMIN, URINE: Microalb, Ur: 2.78 mg/dL — ABNORMAL HIGH (ref 0.00–1.89)

## 2011-10-12 ENCOUNTER — Telehealth: Payer: Self-pay

## 2011-10-12 DIAGNOSIS — E785 Hyperlipidemia, unspecified: Secondary | ICD-10-CM

## 2011-10-12 NOTE — Telephone Encounter (Signed)
Received call from patient in regards to her cholesterol medication.  She has been on Lovastatin for years and takes it as directed, yet after last labs came in [place] discussed sending in another med to add to it.  I do not see where this was done.  Chart to [place] to advise if another medicine is needed.

## 2011-10-14 MED ORDER — COLESEVELAM HCL 625 MG PO TABS: 1875.0000 mg | ORAL_TABLET | Freq: Two times a day (BID) | ORAL | Status: DC

## 2011-10-14 NOTE — Telephone Encounter (Signed)
Yes, THANK YOU for the catch!  I've sent in [place].

## 2011-10-14 NOTE — Telephone Encounter (Signed)
Notified pt that her new chol med has been sent in to pharmacy.

## 2011-11-24 ENCOUNTER — Ambulatory Visit (INDEPENDENT_AMBULATORY_CARE_PROVIDER_SITE_OTHER): Payer: BC Managed Care – PPO | Admitting: Physician Assistant

## 2011-11-24 VITALS — BP 136/84 | HR 60 | Temp 98.2°F | Resp 18 | Ht 63.0 in | Wt 215.0 lb

## 2011-11-24 DIAGNOSIS — R059 Cough, unspecified: Secondary | ICD-10-CM

## 2011-11-24 DIAGNOSIS — J019 Acute sinusitis, unspecified: Secondary | ICD-10-CM

## 2011-11-24 DIAGNOSIS — R05 Cough: Secondary | ICD-10-CM

## 2011-11-24 MED ORDER — GUAIFENESIN ER 1200 MG PO TB12: 1.0000 | ORAL_TABLET | Freq: Two times a day (BID) | ORAL | Status: DC | PRN

## 2011-11-24 MED ORDER — BENZONATATE 100 MG PO CAPS: 100.0000 mg | ORAL_CAPSULE | Freq: Three times a day (TID) | ORAL | Status: DC | PRN

## 2011-11-24 MED ORDER — ALBUTEROL SULFATE HFA 108 (90 BASE) MCG/ACT IN AERS: 2.0000 | INHALATION_SPRAY | Freq: Four times a day (QID) | RESPIRATORY_TRACT | Status: DC | PRN

## 2011-11-24 MED ORDER — CEFDINIR 300 MG PO CAPS: 600.0000 mg | ORAL_CAPSULE | Freq: Every day | ORAL | Status: DC

## 2011-11-24 MED ORDER — IPRATROPIUM BROMIDE 0.03 % NA SOLN: 2.0000 | Freq: Two times a day (BID) | NASAL | Status: DC

## 2011-11-24 NOTE — Patient Instructions (Signed)
Get plenty of rest and drink at least 64 ounces of water daily. 

## 2011-11-24 NOTE — Progress Notes (Signed)
Subjective:    Patient ID: Tracy Kennedy, female    DOB: Apr 05, 1950, 61 y.o.   MRN: [id number]  HPI This 61 y.o. female presents for evaluation of respiratory illness.  Has had increased congestion and drainage for several weeks.  Then 11/20/2011 developed a sore throat, and felt progressively worse.  "My lungs were hurting."  Coughing, productive of white/clear phlegm.  Albuterol inhaler has expired.  No fever/chills. No myalgias, N/V/diarrhea.  No rash.  She will be starting a lifestyle modification program in 2 weeks, "Eat Smart, Move More, Weigh Less."  She's excited to participate in this 15 week program.  Review of Systems As above.   Past Medical History  Diagnosis Date  . HTN (hypertension)   . Asthma   . Obesity   . Diabetes mellitus   . Microalbuminuria   . Cardiac murmur   . Asthma     childhood    Past Surgical History  Procedure Date  . Breast lumpectomy 10/2009    Benign  . Fracture surgery 1994    ankle  . 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 Bunionectomy 07/2010, 08/2011  . Eye surgery 1999    Lasix  . Eye surgery 2013    Cataract    Prior to Admission medications   Medication Sig Start Date End Date Taking? Authorizing Provider  aspirin 81 MG tablet Take 81 mg by mouth daily.   Yes Historical Provider, MD  atenolol-chlorthalidone (TENORETIC) 50-25 MG per tablet Take 1 tablet by mouth daily. 07/01/11  Yes Heather M Marte, PA-C  BIOTIN PO Take by mouth daily.   Yes Historical Provider, MD  CALCIUM PO Take by mouth.   Yes Historical Provider, MD  colesevelam [facility]) 625 MG tablet Take 3 tablets (1,875 mg total) by mouth 2 (two) times daily with a meal. 10/14/11 10/13/12 Yes Emilia Kayes S Erian Rosengren, PA-C  GLUCOSAMINE PO Take by mouth.   Yes Historical Provider, MD  lisinopril (PRINIVIL,ZESTRIL) 40 MG tablet Take 1 tablet (40 mg total) by mouth daily. 07/01/11  Yes Heather M Marte, PA-C  lovastatin (MEVACOR) 40 MG tablet Take 1 tablet (40 mg total) by mouth at  bedtime. 10/07/11  Yes Anagabriela Jokerst S Zareena Willis, PA-C  Multiple Vitamin (MULTIVITAMIN) tablet Take 1 tablet by mouth daily.   Yes Historical Provider, MD  omeprazole (PRILOSEC) 20 MG capsule Take 1 capsule (20 mg total) by mouth daily. 07/01/11  Yes Heather M Marte, PA-C  potassium chloride SA (K-DUR,KLOR-CON) 20 MEQ tablet Take 1 tablet (20 mEq total) by mouth daily. 07/01/11  Yes Heather M Marte, PA-C  verapamil (VERELAN PM) 360 MG 24 hr capsule Take 1 capsule (360 mg total) by mouth daily. 07/01/11  Yes Heather M Marte, PA-C  albuterol (PROVENTIL HFA;VENTOLIN HFA) 108 (90 BASE) MCG/ACT inhaler Inhale 2 puffs into the lungs every 6 (six) hours as needed.    Historical Provider, MD    Allergies  Allergen Reactions  . Codeine     REACTION: nausea    History   Social History  . Marital Status: Significant Other    Spouse Name: Rosey Bath    Number of Children: 0  . Years of Education: 18   Occupational History  . Teacher     [employer]  .  [employer]   Social History Main Topics  . Smoking status: Never Smoker   . Smokeless tobacco: Never Used  . Alcohol Use: 0.0 - 0.5 oz/week    0-1 drink(s) per week  rare (once every 3-4 months)  . Drug Use: No  . Sexually Active: Yes -- Female partner(s)   Other Topics Concern  . Not on file   Social History Narrative   Patient is caregiver for her father who lives next door to her.Her partner has three adult children.    Family History  Problem Relation Age of Onset  . Diabetes Father   . Heart disease Father     CABG 05/2008  . Stroke Father     06/2009       Objective:   Physical Exam  Blood pressure 136/84, pulse 60, temperature 98.2 F (36.8 C), temperature source Oral, resp. rate 18, height 5\' 3"  (1.6 m), weight 215 lb (97.523 kg), last menstrual period 01/10/2000, SpO2 95.00%. Body mass index is 38.09 kg/(m^2). Well-developed, well nourished WF who is awake, alert and oriented, in NAD. HEENT: [place]/AT, PERRL, EOMI.  Sclera and  conjunctiva are clear.  EAC are patent, TMs are normal in appearance. Nasal mucosa is pink and moist. OP is clear. Mild sinus tenderness maxillary>frontal. Neck: supple, non-tender, no lymphadenopathy, thyromegaly. Heart: RRR, no murmur Lungs: normal effort, CTA Abdomen: normo-active bowel sounds, supple, non-tender, no mass or organomegaly. Extremities: no cyanosis, clubbing or edema. Skin: warm and dry without rash. Psychologic: good mood and appropriate affect, normal speech and behavior.     Assessment & Plan:   1. Acute sinusitis, unspecified  cefdinir (OMNICEF) 300 MG capsule, ipratropium (ATROVENT) 0.03 % nasal spray, Guaifenesin (MUCINEX MAXIMUM STRENGTH) 1200 MG TB12, albuterol (PROVENTIL HFA;VENTOLIN HFA) 108 (90 BASE) MCG/ACT inhaler  2. Cough  benzonatate (TESSALON) 100 MG capsule, albuterol (PROVENTIL HFA;VENTOLIN HFA) 108 (90 BASE) MCG/ACT inhaler  Supportive care. Encouraged participation in lifestyle changes for healthier living.  RTC 01/13/2012 as planned, sooner if needed.

## 2012-01-01 ENCOUNTER — Other Ambulatory Visit: Payer: Self-pay | Admitting: *Deleted

## 2012-01-01 DIAGNOSIS — K2100 Gastro-esophageal reflux disease with esophagitis, without bleeding: Secondary | ICD-10-CM

## 2012-01-01 MED ORDER — OMEPRAZOLE 20 MG PO CPDR: 20.0000 mg | DELAYED_RELEASE_CAPSULE | Freq: Every day | ORAL | Status: DC

## 2012-01-01 MED ORDER — LISINOPRIL 40 MG PO TABS: 40.0000 mg | ORAL_TABLET | Freq: Every day | ORAL | Status: DC

## 2012-01-13 ENCOUNTER — Ambulatory Visit (INDEPENDENT_AMBULATORY_CARE_PROVIDER_SITE_OTHER): Payer: BC Managed Care – PPO | Admitting: Physician Assistant

## 2012-01-13 ENCOUNTER — Encounter: Payer: Self-pay | Admitting: Physician Assistant

## 2012-01-13 VITALS — BP 138/90 | HR 47 | Temp 98.1°F | Resp 16 | Ht 62.0 in | Wt 214.2 lb

## 2012-01-13 DIAGNOSIS — I10 Essential (primary) hypertension: Secondary | ICD-10-CM

## 2012-01-13 DIAGNOSIS — M79609 Pain in unspecified limb: Secondary | ICD-10-CM

## 2012-01-13 DIAGNOSIS — M79641 Pain in right hand: Secondary | ICD-10-CM

## 2012-01-13 DIAGNOSIS — E119 Type 2 diabetes mellitus without complications: Secondary | ICD-10-CM

## 2012-01-13 DIAGNOSIS — E669 Obesity, unspecified: Secondary | ICD-10-CM

## 2012-01-13 DIAGNOSIS — E785 Hyperlipidemia, unspecified: Secondary | ICD-10-CM

## 2012-01-13 LAB — LIPID PANEL
Cholesterol: 181 mg/dL (ref 0–200)
HDL: 54 mg/dL (ref >39)
LDL Cholesterol: 91 mg/dL (ref 0–99)
Triglycerides: 181 mg/dL — ABNORMAL HIGH (ref <150)

## 2012-01-13 LAB — COMPREHENSIVE METABOLIC PANEL
Albumin: 4.1 g/dL (ref 3.5–5.2)
Alkaline Phosphatase: 60 U/L (ref 39–117)
BUN: 16 mg/dL (ref 6–23)
CO2: 30 mEq/L (ref 19–32)
Calcium: 9.8 mg/dL (ref 8.4–10.5)
Chloride: 103 mEq/L (ref 96–112)
Glucose, Bld: 108 mg/dL — ABNORMAL HIGH (ref 70–99)
Potassium: 3.8 mEq/L (ref 3.5–5.3)
Sodium: 141 mEq/L (ref 135–145)
Total Protein: 7 g/dL (ref 6.0–8.3)

## 2012-01-13 NOTE — Assessment & Plan Note (Signed)
Continue current treatment and efforts for weight loss.  RTC 3 months.

## 2012-01-13 NOTE — Assessment & Plan Note (Signed)
Controlled, but borderline today. Continue current treatment and efforts for weight loss.  RTC 3 months.

## 2012-01-13 NOTE — Patient Instructions (Addendum)
Wear the wrist splints as you are able.

## 2012-01-13 NOTE — Assessment & Plan Note (Signed)
Continue current treatment and efforts for weight loss.  RTC 3 months.  

## 2012-01-13 NOTE — Assessment & Plan Note (Signed)
Well controlled. Continue current treatment and efforts for weight loss.  RTC 3 months.

## 2012-01-13 NOTE — Progress Notes (Signed)
Subjective:    Patient ID: Tracy Kennedy, female    DOB: [birth year], 61 y.o.   MRN: [id number]  HPI This 61 y.o. female presents for evaluation of:  . HYPERLIPIDEMIA  . HYPERTENSION  . ASTHMA  . GERD  . MURMUR  . Obesity  . Type II or unspecified type diabetes mellitus without mention of complication, not stated as uncontrolled   Bilateral hand pain has developed since her last visit.  Even the skin is sensitive.  Muscles and joints hurt.  No swelling of the joints, or otherwise.  Some improvement over the holiday with reduced keyboarding.  Has been walking again for a month, since released after bunionectomy. Will begin exercising at the [facility] this week. Initially, lost 4 lbs in 4 weeks with her new weight loss plan, but is ready to step it up.  Checks blood sugar every few days, ranges 98-137 (fasting).  Daily foot checks. No hypoglycemia Dental exam Q six months.  Eye exam regularly (recent cataract repair).  Current with pneumococcal and influenza vaccines.   Past Medical History  Diagnosis Date  . HTN (hypertension)   . Asthma   . Obesity   . Diabetes mellitus   . Microalbuminuria   . Cardiac murmur   . Asthma     childhood    Past Surgical History  Procedure Date  . Breast lumpectomy 10/2009    Benign  . Fracture surgery 1994    ankle  . 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 Bunionectomy 07/2010, 08/2011  . Eye surgery 1999    Lasix  . Eye surgery 2013    Cataract, lens replacement bilaterally    Prior to Admission medications   Medication Sig Start Date End Date Taking? Authorizing Provider  albuterol (PROVENTIL HFA;VENTOLIN HFA) 108 (90 BASE) MCG/ACT inhaler Inhale 2 puffs into the lungs every 6 (six) hours as needed. 11/24/11  Yes Achol Azpeitia S Mikiala Fugett, PA-C  aspirin 81 MG tablet Take 81 mg by mouth daily.   Yes Historical Provider, MD  atenolol-chlorthalidone (TENORETIC) 50-25 MG per tablet Take 1 tablet by mouth daily. 07/01/11  Yes  Heather M Marte, PA-C  BIOTIN PO Take by mouth daily.   Yes Historical Provider, MD  CALCIUM PO Take by mouth.   Yes Historical Provider, MD  colesevelam [facility]) 625 MG tablet Take 3 tablets (1,875 mg total) by mouth 2 (two) times daily with a meal. 10/14/11 10/13/12 Yes Sylar Voong S Fantasy Donald, PA-C  GLUCOSAMINE PO Take by mouth.   Yes Historical Provider, MD  ipratropium (ATROVENT) 0.03 % nasal spray Place 2 sprays into the nose 2 (two) times daily. 11/24/11  Yes Aurelie Dicenzo S Brance Dartt, PA-C  lisinopril (PRINIVIL,ZESTRIL) 40 MG tablet Take 1 tablet (40 mg total) by mouth daily. 01/01/12  Yes Ryan M Dunn, PA-C  lovastatin (MEVACOR) 40 MG tablet Take 1 tablet (40 mg total) by mouth at bedtime. 10/07/11  Yes Shamirah Ivan S Shenna Brissette, PA-C  Multiple Vitamin (MULTIVITAMIN) tablet Take 1 tablet by mouth daily.   Yes Historical Provider, MD  omeprazole (PRILOSEC) 20 MG capsule Take 1 capsule (20 mg total) by mouth daily. 01/01/12  Yes Ryan M Dunn, PA-C  potassium chloride SA (K-DUR,KLOR-CON) 20 MEQ tablet Take 1 tablet (20 mEq total) by mouth daily. 07/01/11  Yes Heather M Marte, PA-C  verapamil (VERELAN PM) 360 MG 24 hr capsule Take 1 capsule (360 mg total) by mouth daily. 07/01/11  Yes Nelva Nay, PA-C    Allergies  Allergen Reactions  . Codeine     REACTION: nausea    History   Social History  . Marital Status: Significant Other    Spouse Name: Rosey Bath    Number of Children: 0  . Years of Education: 18   Occupational History  . Teacher     [employer]  .  [employer]   Social History Main Topics  . Smoking status: Never Smoker   . Smokeless tobacco: Never Used  . Alcohol Use: 0.0 - 0.5 oz/week    0-1 drink(s) per week     Comment: rare (once every 3-4 months)  . Drug Use: No  . Sexually Active: Yes -- Female partner(s)   Other Topics Concern  . Not on file   Social History Narrative   Patient is caregiver for her father who lives next door to her.Her partner has three adult children.     Family History  Problem Relation Age of Onset  . Diabetes Father   . Heart disease Father     CABG 05/2008  . Stroke Father     06/2009  . Arthritis Sister     bilateral hips    Review of Systems Denies chest pain, shortness of breath, HA, dizziness, vision change, nausea, vomiting, diarrhea, constipation, melena, hematochezia, dysuria, increased urinary urgency or frequency, increased hunger or thirst, unintentional weight change, unexplained myalgias or arthralgias, rash.     Objective:   Physical Exam Blood pressure 138/90, pulse 47, temperature 98.1 F (36.7 C), temperature source Oral, resp. rate 16, height 5\' 2"  (1.575 m), weight 214 lb 3.2 oz (97.16 kg), last menstrual period 01/10/2000, SpO2 96.00%. Body mass index is 39.18 kg/(m^2). Well-developed, well nourished WF who is awake, alert and oriented, in NAD. HEENT: [place]/AT, PERRL, EOMI.  Sclera and conjunctiva are clear.  EAC are patent, TMs are normal in appearance. Nasal mucosa is pink and moist. OP is clear. Neck: supple, non-tender, no lymphadenopathy, thyromegaly. Heart: RRR, loud murmur (loudest in the aortic space). Lungs: normal effort, CTA Abdomen: normo-active bowel sounds, supple, non-tender, no mass or organomegaly. Extremities: no cyanosis, clubbing or edema. See Diabetic foot exam. Skin: warm and dry without rash. Psychologic: good mood and appropriate affect, normal speech and behavior.  Results for orders placed in visit on 01/13/12  GLUCOSE, POCT (MANUAL RESULT ENTRY)      Component Value Range   POC Glucose 104 (*) 70 - 99 mg/dl  POCT GLYCOSYLATED HEMOGLOBIN (HGB A1C)      Component Value Range   Hemoglobin A1C 5.8        Assessment & Plan:   1. HYPERLIPIDEMIA  Lipid panel  2. HYPERTENSION    3. Type II or unspecified type diabetes mellitus without mention of complication, not stated as uncontrolled  POCT glucose (manual entry), POCT glycosylated hemoglobin (Hb A1C), Comprehensive metabolic panel,  Microalbumin, urine  4. Obesity    5. Bilateral hand pain

## 2012-02-03 ENCOUNTER — Other Ambulatory Visit: Payer: Self-pay | Admitting: Physician Assistant

## 2012-02-06 ENCOUNTER — Other Ambulatory Visit: Payer: Self-pay | Admitting: *Deleted

## 2012-02-06 MED ORDER — ATENOLOL-CHLORTHALIDONE 50-25 MG PO TABS: 1.0000 | ORAL_TABLET | Freq: Every day | ORAL | Status: DC

## 2012-02-12 ENCOUNTER — Telehealth: Payer: Self-pay | Admitting: *Deleted

## 2012-02-12 NOTE — Telephone Encounter (Signed)
Pharmacy requesting refill on potassium cl 20 MEQ ER.  Last refill 11/19/11

## 2012-02-13 MED ORDER — POTASSIUM CHLORIDE CRYS ER 20 MEQ PO TBCR: 20.0000 meq | EXTENDED_RELEASE_TABLET | Freq: Every day | ORAL | Status: DC

## 2012-02-13 NOTE — Telephone Encounter (Signed)
rx sent to pharmacy

## 2012-02-28 ENCOUNTER — Other Ambulatory Visit: Payer: Self-pay | Admitting: Physician Assistant

## 2012-03-27 ENCOUNTER — Other Ambulatory Visit: Payer: Self-pay

## 2012-04-20 ENCOUNTER — Ambulatory Visit: Payer: BC Managed Care – PPO | Admitting: Physician Assistant

## 2012-04-29 ENCOUNTER — Ambulatory Visit: Payer: BC Managed Care – PPO | Admitting: Physician Assistant

## 2012-05-01 ENCOUNTER — Other Ambulatory Visit: Payer: Self-pay | Admitting: Physician Assistant

## 2012-05-06 ENCOUNTER — Ambulatory Visit (INDEPENDENT_AMBULATORY_CARE_PROVIDER_SITE_OTHER): Payer: BC Managed Care – PPO | Admitting: Physician Assistant

## 2012-05-06 ENCOUNTER — Encounter: Payer: Self-pay | Admitting: Physician Assistant

## 2012-05-06 VITALS — BP 155/74 | HR 54 | Temp 98.0°F | Resp 18 | Ht 63.0 in | Wt 215.0 lb

## 2012-05-06 DIAGNOSIS — E119 Type 2 diabetes mellitus without complications: Secondary | ICD-10-CM

## 2012-05-06 DIAGNOSIS — E785 Hyperlipidemia, unspecified: Secondary | ICD-10-CM

## 2012-05-06 DIAGNOSIS — R011 Cardiac murmur, unspecified: Secondary | ICD-10-CM

## 2012-05-06 DIAGNOSIS — K219 Gastro-esophageal reflux disease without esophagitis: Secondary | ICD-10-CM

## 2012-05-06 DIAGNOSIS — I10 Essential (primary) hypertension: Secondary | ICD-10-CM

## 2012-05-06 LAB — LIPID PANEL
HDL: 54 mg/dL (ref >39)
LDL Cholesterol: 101 mg/dL — ABNORMAL HIGH (ref 0–99)
Total CHOL/HDL Ratio: 3.4 Ratio
VLDL: 27 mg/dL (ref 0–40)

## 2012-05-06 LAB — COMPREHENSIVE METABOLIC PANEL
ALT: 27 U/L (ref 0–35)
CO2: 29 mEq/L (ref 19–32)
Chloride: 103 mEq/L (ref 96–112)
Glucose, Bld: 110 mg/dL — ABNORMAL HIGH (ref 70–99)
Sodium: 142 mEq/L (ref 135–145)
Total Protein: 7.1 g/dL (ref 6.0–8.3)

## 2012-05-06 LAB — MICROALBUMIN, URINE: Microalb, Ur: 5.06 mg/dL — ABNORMAL HIGH (ref 0.00–1.89)

## 2012-05-06 MED ORDER — OMEPRAZOLE 20 MG PO CPDR: 20.0000 mg | DELAYED_RELEASE_CAPSULE | Freq: Every day | ORAL | Status: DC

## 2012-05-06 MED ORDER — LOVASTATIN 40 MG PO TABS: 40.0000 mg | ORAL_TABLET | Freq: Every day | ORAL | Status: DC

## 2012-05-06 MED ORDER — VERAPAMIL HCL ER 360 MG PO CP24: 360.0000 mg | ORAL_CAPSULE | Freq: Every day | ORAL | Status: DC

## 2012-05-06 MED ORDER — ATENOLOL-CHLORTHALIDONE 50-25 MG PO TABS: 1.0000 | ORAL_TABLET | Freq: Every day | ORAL | Status: DC

## 2012-05-06 MED ORDER — LISINOPRIL 40 MG PO TABS: 40.0000 mg | ORAL_TABLET | Freq: Every day | ORAL | Status: DC

## 2012-05-06 NOTE — Progress Notes (Signed)
Subjective:    Patient ID: Tracy Kennedy, female    DOB: Sep 12, 1950, 62 y.o.   MRN: [id number]  HPI This 62 y.o. female presents for evaluation of DM type 2, HTN, hyperlipidemia.  Checks blood sugar every few days, ranges 98-150, only 1 reading above 140 (fasting).  Daily foot checks.  No hypoglycemia  Dental exam Q six months.  Eye exam regularly.  Current with pneumococcal and influenza vaccines.  Tried an eating program from the [employer], but stopped it after an 8 lb weight gain.  Has lost several of those pounds.  Has been released to walking since her foot surgery, but hasn't done much due to the extended winter weather we've had. Initially, had difficulty remembering the second dose of Welchol, but has recently begun taking it at lunch. Ran out of glucosamine temporarily, and discovered it was really helping, so restarted it with good results.   Past Medical History  Diagnosis Date  . HTN (hypertension)   . Asthma   . Obesity   . Diabetes mellitus   . Microalbuminuria   . Cardiac murmur   . Asthma     childhood    Past Surgical History  Procedure Laterality Date  . Breast lumpectomy  10/2009    Benign  . Fracture surgery  1994    ankle  . Elbow surgery  2003    Right, bone tumor  . Bunionectomy  07/2010, 08/2011  . Eye surgery  1999    Lasix  . Eye surgery  2013    Cataract, lens replacement bilaterally    Prior to Admission medications   Medication Sig Start Date End Date Taking? Authorizing Provider  albuterol (PROVENTIL HFA;VENTOLIN HFA) 108 (90 BASE) MCG/ACT inhaler Inhale 2 puffs into the lungs every 6 (six) hours as needed. 11/24/11  Yes Makeila Yamaguchi S Kmya Placide, PA-C  aspirin 81 MG tablet Take 81 mg by mouth daily.   Yes Historical Provider, MD  atenolol-chlorthalidone (TENORETIC) 50-25 MG per tablet TAKE 1 TABLET BY MOUTH DAILY 05/01/12  Yes Ryan M Dunn, PA-C  BIOTIN PO Take by mouth daily.   Yes Historical Provider, MD  CALCIUM PO Take by mouth.   Yes Historical  Provider, MD  colesevelam [facility]) 625 MG tablet Take 3 tablets (1,875 mg total) by mouth 2 (two) times daily with a meal. 10/14/11 10/13/12 Yes Terecia Plaut S Christphor Groft, PA-C  GLUCOSAMINE PO Take by mouth.   Yes Historical Provider, MD  ipratropium (ATROVENT) 0.03 % nasal spray Place 2 sprays into the nose 2 (two) times daily. 11/24/11  Yes Anandi Abramo S Neriyah Cercone, PA-C  lisinopril (PRINIVIL,ZESTRIL) 40 MG tablet TAKE 1 TABLET BY MOUTH DAILY 02/03/12  Yes Ryan M Dunn, PA-C  lovastatin (MEVACOR) 40 MG tablet Take 1 tablet (40 mg total) by mouth at bedtime. 10/07/11  Yes Page Pucciarelli S Claude Waldman, PA-C  Multiple Vitamin (MULTIVITAMIN) tablet Take 1 tablet by mouth daily.   Yes Historical Provider, MD  omeprazole (PRILOSEC) 20 MG capsule TAKE ONE CAPSULE BY MOUTH DAILY 02/03/12  Yes Ryan M Dunn, PA-C  potassium chloride SA (K-DUR,KLOR-CON) 20 MEQ tablet Take 1 tablet (20 mEq total) by mouth daily. 02/13/12  Yes Heather M Marte, PA-C  verapamil (VERELAN PM) 360 MG 24 hr capsule Take 1 capsule (360 mg total) by mouth daily. 07/01/11  Yes Nelva Nay, PA-C    Allergies  Allergen Reactions  . Codeine     REACTION: nausea    History   Social History  . Marital Status: Significant Other  Spouse Name: Rosey Bath    Number of Children: 0  . Years of Education: 9   Occupational History  . Teacher     [employer]  .  [employer]   Social History Main Topics  . Smoking status: Never Smoker   . Smokeless tobacco: Never Used  . Alcohol Use: 0 - .5 oz/week    0-1 drink(s) per week     Comment: rare (once every 3-4 months)  . Drug Use: No  . Sexually Active: Yes -- Female partner(s)   Other Topics Concern  . Not on file   Social History Narrative   Patient is caregiver for her father who lives next door to her.   Her partner has three adult children.    Family History  Problem Relation Age of Onset  . Diabetes Father   . Heart disease Father     CABG 05/2008  . Stroke Father     06/2009  . Arthritis Sister      bilateral hips     Review of Systems Denies chest pain, shortness of breath, HA, dizziness, vision change, nausea, vomiting, diarrhea, constipation, melena, hematochezia, dysuria, increased urinary urgency or frequency, increased hunger or thirst, unintentional weight change, unexplained myalgias or arthralgias, rash.     Objective:   Physical Exam  Blood pressure 164/73, pulse 54, temperature 98 F (36.7 C), resp. rate 18, height 5\' 3"  (1.6 m), weight 215 lb (97.523 kg), last menstrual period 01/10/2000. Body mass index is 38.09 kg/(m^2). Repeat BP 155/74. Patient notes some frustration just prior to having her vital signs checked.  She has access to BP checks at home. Well-developed, well nourished WF who is awake, alert and oriented, in NAD. HEENT: [place]/AT, PERRL, EOMI.  Sclera and conjunctiva are clear.  EAC are patent, TMs are normal in appearance. Nasal mucosa is pink and moist. OP is clear. Neck: supple, non-tender, no lymphadenopathy, thyromegaly. Heart: RRR, systolic murmur heard best in the aortic space. Lungs: normal effort, CTA Abdomen: normo-active bowel sounds, supple, non-tender, no mass or organomegaly. Extremities: no cyanosis, clubbing or edema. Skin: warm and dry without rash. Psychologic: good mood and appropriate affect, normal speech and behavior.  Results for orders placed in visit on 05/06/12  GLUCOSE, POCT (MANUAL RESULT ENTRY)      Result Value Range   POC Glucose 103 (*) 70 - 99 mg/dl  POCT GLYCOSYLATED HEMOGLOBIN (HGB A1C)      Result Value Range   Hemoglobin A1C 5.9        Assessment & Plan:  Type II or unspecified type diabetes mellitus without mention of complication, not stated as uncontrolled - Plan: POCT glucose (manual entry), POCT glycosylated hemoglobin (Hb A1C), Microalbumin, urine, Comprehensive metabolic panel, lisinopril (PRINIVIL,ZESTRIL) 40 MG tablet, lovastatin (MEVACOR) 40 MG tablet  HYPERTENSION - Plan: verapamil (VERELAN PM) 360 MG  24 hr capsule, atenolol-chlorthalidone (TENORETIC) 50-25 MG per tablet, lisinopril (PRINIVIL,ZESTRIL) 40 MG tablet  HYPERLIPIDEMIA - Plan: Lipid panel  GERD - Plan: omeprazole (PRILOSEC) 20 MG capsule

## 2012-05-06 NOTE — Patient Instructions (Addendum)
Things that often make reflux symptoms worse: Caffeine Carbonation (soda) Spicy foods Acidic foods (like tomato sauce, orange juice, lemonade) Fatty foods (including whole milk and ice cream) Stress (feeling sad, worried, nervous) Nicotine Alcohol  Check your blood pressure several times each week.  It should be 134/84 or less.

## 2012-08-05 ENCOUNTER — Ambulatory Visit: Payer: BC Managed Care – PPO | Admitting: Physician Assistant

## 2012-08-10 ENCOUNTER — Ambulatory Visit: Payer: BC Managed Care – PPO | Admitting: Physician Assistant

## 2012-08-19 ENCOUNTER — Ambulatory Visit (INDEPENDENT_AMBULATORY_CARE_PROVIDER_SITE_OTHER): Payer: BC Managed Care – PPO | Admitting: Physician Assistant

## 2012-08-19 ENCOUNTER — Encounter: Payer: Self-pay | Admitting: Physician Assistant

## 2012-08-19 VITALS — BP 144/84 | HR 57 | Temp 98.7°F | Resp 16 | Ht 62.75 in | Wt 218.0 lb

## 2012-08-19 DIAGNOSIS — E669 Obesity, unspecified: Secondary | ICD-10-CM

## 2012-08-19 DIAGNOSIS — R809 Proteinuria, unspecified: Secondary | ICD-10-CM

## 2012-08-19 DIAGNOSIS — E119 Type 2 diabetes mellitus without complications: Secondary | ICD-10-CM

## 2012-08-19 DIAGNOSIS — E785 Hyperlipidemia, unspecified: Secondary | ICD-10-CM

## 2012-08-19 DIAGNOSIS — I10 Essential (primary) hypertension: Secondary | ICD-10-CM

## 2012-08-19 LAB — POCT GLYCOSYLATED HEMOGLOBIN (HGB A1C): Hemoglobin A1C: 5.9

## 2012-08-19 LAB — LIPID PANEL
Cholesterol: 152 mg/dL (ref 0–200)
LDL Cholesterol: 80 mg/dL (ref 0–99)
Total CHOL/HDL Ratio: 3.6 Ratio
VLDL: 30 mg/dL (ref 0–40)

## 2012-08-19 LAB — COMPREHENSIVE METABOLIC PANEL
ALT: 27 U/L (ref 0–35)
Alkaline Phosphatase: 60 U/L (ref 39–117)
CO2: 28 mEq/L (ref 19–32)
Creat: 0.64 mg/dL (ref 0.50–1.10)
Sodium: 141 mEq/L (ref 135–145)
Total Bilirubin: 0.3 mg/dL (ref 0.3–1.2)

## 2012-08-19 LAB — GLUCOSE, POCT (MANUAL RESULT ENTRY): POC Glucose: 110 mg/dl — AB (ref 70–99)

## 2012-08-19 MED ORDER — POTASSIUM CHLORIDE CRYS ER 20 MEQ PO TBCR: 20.0000 meq | EXTENDED_RELEASE_TABLET | Freq: Every day | ORAL | Status: DC

## 2012-08-19 MED ORDER — COLESEVELAM HCL 625 MG PO TABS: 1875.0000 mg | ORAL_TABLET | Freq: Two times a day (BID) | ORAL | Status: DC

## 2012-08-19 NOTE — Patient Instructions (Signed)
Keep up the work with healthy eating and regular exercise.

## 2012-08-19 NOTE — Progress Notes (Signed)
I have examined this patient along with the student and agree.  Results for orders placed in visit on 08/19/12  GLUCOSE, POCT (MANUAL RESULT ENTRY)      Result Value Range   POC Glucose 110 (*) 70 - 99 mg/dl  POCT GLYCOSYLATED HEMOGLOBIN (HGB A1C)      Result Value Range   Hemoglobin A1C 5.9

## 2012-08-19 NOTE — Progress Notes (Signed)
  Subjective:    Patient ID: Tracy Kennedy, female    DOB: 10-Sep-1950, 62 y.o.   MRN: [id number]  HPI  Presents for follow up of DM, HTN. States she is beginning the exchange diet sponsored by the [employer]. States she has been doing walking videos. She would like to lose weight but has limited support at home. States blood sugars in the morning have been between 112-130. Home measurements of BP have averaged between 130-136/78-84.   Review of Systems Denies: fever, headache, chest pain, SOB, abdominal pain, urinary symptoms     Objective:   Physical Exam  BP 144/84  Pulse 57  Temp(Src) 98.7 F (37.1 C) (Oral)  Resp 16  Ht 5' 2.75" (1.594 m)  Wt 218 lb (98.884 kg)  BMI 38.92 kg/m2  SpO2 98%  LMP 01/10/2000  General: WDWN female, appears stated age, NAD HEENT: normocephalic, atraumatic, sclera/conjunctiva clear, no palpable lymphadenopathy  Resp: good air entry, CTA bilaterally without rales rhonchi wheezes  Cardiac: RRR, systolic murmur heard in aortic and pulmonic spaces Extremities: radial & dorsalis pedis pulse intact, 2+ reflexes biceps patellar and achilles, see diabetic foot exam  Neuro: A&O x 3  Skin: no rashes lesions      Assessment & Plan:  Type II or unspecified type diabetes mellitus without mention of complication, not stated as uncontrolled - Plan: POCT glucose (manual entry), POCT glycosylated hemoglobin (Hb A1C), Comprehensive metabolic panel  Hyperlipidemia - Plan: Lipid panel, colesevelam (WELCHOL) 625 MG tablet  Hypertension, benign - Plan: potassium chloride SA (K-DUR,KLOR-CON) 20 MEQ tablet  Microalbuminuria - Plan: Microalbumin, urine  Obesity  RTC in 3 months

## 2012-08-20 NOTE — Addendum Note (Signed)
Addended by: Fernande Bras on: 08/20/2012 08:59 AM   Modules accepted: Orders

## 2012-09-02 ENCOUNTER — Telehealth: Payer: Self-pay | Admitting: Physician Assistant

## 2012-09-02 DIAGNOSIS — R011 Cardiac murmur, unspecified: Secondary | ICD-10-CM

## 2012-09-02 NOTE — Telephone Encounter (Signed)
Just ECHO.

## 2012-09-02 NOTE — Telephone Encounter (Signed)
Please contact this patient. I've reviewed her paper record, and I do not see and ECHO. If she's had one, and can get me a report, please do.  Otherwise, I'd like her to have one (refer to [facility]).

## 2012-09-02 NOTE — Telephone Encounter (Signed)
Called patient advised. Referral pended, do you want to order echo or cardiology eval? Sign the one you want remove the other (both pended)

## 2012-09-16 ENCOUNTER — Telehealth: Payer: Self-pay | Admitting: Radiology

## 2012-09-16 NOTE — Telephone Encounter (Signed)
Call [facility]  [phone] Clydie Braun she is asking if Echo is precerted, please advise.

## 2012-09-21 ENCOUNTER — Telehealth: Payer: Self-pay | Admitting: Radiology

## 2012-09-21 DIAGNOSIS — R011 Cardiac murmur, unspecified: Secondary | ICD-10-CM

## 2012-09-21 NOTE — Telephone Encounter (Signed)
Let's set her up with cardiology for evaluation of murmur.

## 2012-09-21 NOTE — Telephone Encounter (Signed)
Patients echo was denied by the insurance company patient does not meet criteria. We can file an appeal, or send to cardiology for further eval/ please advise, denial in my box if you want to review.

## 2012-09-22 NOTE — Telephone Encounter (Signed)
Referral made. Left message for patient.

## 2012-10-11 ENCOUNTER — Other Ambulatory Visit: Payer: Self-pay

## 2012-10-11 MED ORDER — LANCETS MISC: Status: AC

## 2012-10-11 MED ORDER — LANCING DEVICE MISC: Status: DC

## 2012-10-11 MED ORDER — BLOOD GLUCOSE MONITOR KIT: PACK | Status: DC

## 2012-10-11 MED ORDER — GLUCOSE BLOOD VI STRP: ORAL_STRIP | Status: DC

## 2012-10-19 ENCOUNTER — Telehealth: Payer: Self-pay

## 2012-10-19 NOTE — Telephone Encounter (Signed)
[facility] STATES THEY HAVE BEEN TRYING TO GET PT'S DIABETIC MEDS REFILLED SEVERAL TIMES AND HAVEN'T HEARD FROM ANYONE.  PLEASE CALL [phone] AND THE FAX IS [phone]

## 2012-10-19 NOTE — Telephone Encounter (Signed)
What med are they requesting? Left mssg for pharmacy to call me back to advise.

## 2012-10-20 MED ORDER — GLUCOSE BLOOD VI STRP: ORAL_STRIP | Status: DC

## 2012-10-20 MED ORDER — LANCING DEVICE MISC: Status: AC

## 2012-10-20 NOTE — Telephone Encounter (Signed)
Test strips and lancets, have sent.

## 2012-11-05 ENCOUNTER — Encounter: Payer: Self-pay | Admitting: Physician Assistant

## 2012-11-10 ENCOUNTER — Ambulatory Visit: Payer: Self-pay | Admitting: Cardiovascular Disease

## 2012-11-30 ENCOUNTER — Ambulatory Visit (INDEPENDENT_AMBULATORY_CARE_PROVIDER_SITE_OTHER): Payer: BC Managed Care – PPO | Admitting: Physician Assistant

## 2012-11-30 ENCOUNTER — Encounter: Payer: Self-pay | Admitting: Physician Assistant

## 2012-11-30 VITALS — BP 120/82 | HR 51 | Temp 98.3°F | Resp 16 | Ht 62.0 in | Wt 213.2 lb

## 2012-11-30 DIAGNOSIS — J45909 Unspecified asthma, uncomplicated: Secondary | ICD-10-CM

## 2012-11-30 DIAGNOSIS — Z1159 Encounter for screening for other viral diseases: Secondary | ICD-10-CM

## 2012-11-30 DIAGNOSIS — E1129 Type 2 diabetes mellitus with other diabetic kidney complication: Secondary | ICD-10-CM | POA: Insufficient documentation

## 2012-11-30 DIAGNOSIS — I10 Essential (primary) hypertension: Secondary | ICD-10-CM

## 2012-11-30 DIAGNOSIS — R011 Cardiac murmur, unspecified: Secondary | ICD-10-CM

## 2012-11-30 DIAGNOSIS — E785 Hyperlipidemia, unspecified: Secondary | ICD-10-CM

## 2012-11-30 DIAGNOSIS — E669 Obesity, unspecified: Secondary | ICD-10-CM

## 2012-11-30 DIAGNOSIS — Z23 Encounter for immunization: Secondary | ICD-10-CM

## 2012-11-30 DIAGNOSIS — E119 Type 2 diabetes mellitus without complications: Secondary | ICD-10-CM

## 2012-11-30 LAB — COMPREHENSIVE METABOLIC PANEL
AST: 20 U/L (ref 0–37)
Albumin: 4 g/dL (ref 3.5–5.2)
Alkaline Phosphatase: 53 U/L (ref 39–117)
BUN: 11 mg/dL (ref 6–23)
Calcium: 9.4 mg/dL (ref 8.4–10.5)
Chloride: 103 mEq/L (ref 96–112)
Creat: 0.57 mg/dL (ref 0.50–1.10)
Glucose, Bld: 101 mg/dL — ABNORMAL HIGH (ref 70–99)
Potassium: 3.5 mEq/L (ref 3.5–5.3)
Sodium: 141 mEq/L (ref 135–145)
Total Bilirubin: 0.4 mg/dL (ref 0.3–1.2)
Total Protein: 6.9 g/dL (ref 6.0–8.3)

## 2012-11-30 LAB — CBC WITH DIFFERENTIAL/PLATELET
Basophils Relative: 0 % (ref 0–1)
Eosinophils Absolute: 0.1 10*3/uL (ref 0.0–0.7)
Eosinophils Relative: 2 % (ref 0–5)
Lymphs Abs: 2 10*3/uL (ref 0.7–4.0)
MCH: 29.9 pg (ref 26.0–34.0)
MCHC: 34 g/dL (ref 30.0–36.0)
MCV: 87.9 fL (ref 78.0–100.0)
Monocytes Absolute: 0.7 10*3/uL (ref 0.1–1.0)
Neutrophils Relative %: 61 % (ref 43–77)
Platelets: 298 10*3/uL (ref 150–400)
RBC: 4.48 MIL/uL (ref 3.87–5.11)
RDW: 13.8 % (ref 11.5–15.5)

## 2012-11-30 LAB — LIPID PANEL
Cholesterol: 159 mg/dL (ref 0–200)
Total CHOL/HDL Ratio: 3.3 Ratio

## 2012-11-30 LAB — GLUCOSE, POCT (MANUAL RESULT ENTRY): POC Glucose: 105 mg/dl — AB (ref 70–99)

## 2012-11-30 LAB — HEPATITIS C ANTIBODY: HCV Ab: NEGATIVE

## 2012-11-30 MED ORDER — ZOSTER VACCINE LIVE 19400 UNT/0.65ML ~~LOC~~ SOLR: 0.6500 mL | Freq: Once | SUBCUTANEOUS | Status: DC

## 2012-11-30 NOTE — Patient Instructions (Signed)
If you'd like to try stopping the omeprazole again, try replacing it with ranitidine (Zantac) 150 mg or Pepcid 20-40 mg twice each day. If your symptoms are controlled on that regimen for 2-4 weeks, try reducing the dose to once daily, and then to as needed if your symptoms remain controlled.  Please schedule your mammogram.

## 2012-11-30 NOTE — Progress Notes (Signed)
  Subjective:    Patient ID: Tracy Kennedy, female    DOB: 01/28/1951, 62 y.o.   MRN: [id number]  HPI This 62 y.o. female presents for evaluation of DM type 2 with microalbuminuria (evaluated by nephrology), HTN, hyperlipidemia and obesity.  Frequency of home glucose monitoring: Q2-3 days 110-120's Sees a dentist Q6 months (next visit in 2 weeks), eye specialist annually (next visit in 3 weeks). Checks feet daily. Is not current with influenza vaccine, but will receive it a work in the next 2 weeks. Is not current with pneumococcal vaccine.  Has made even more efforts toward healthy eating, and her job is more active and less stressful.  Home BP 110's-120's/70's-80's  Review of Systems Denies chest pain, shortness of breath, HA, dizziness, vision change, nausea, vomiting, diarrhea, constipation, melena, hematochezia, dysuria, increased urinary urgency or frequency, increased hunger or thirst, unintentional weight change, unexplained myalgias or arthralgias, rash.     Objective:   Physical Exam Blood pressure 120/82, pulse 51, temperature 98.3 F (36.8 C), temperature source Oral, resp. rate 16, height 5\' 2"  (1.575 m), weight 213 lb 3.2 oz (96.707 kg), last menstrual period 01/10/2000, SpO2 97.00%. Body mass index is 38.98 kg/(m^2). Well-developed, well nourished WF who is awake, alert and oriented, in NAD. HEENT: [place]/AT, sclera and conjunctiva are clear.   Neck: supple, non-tender, no lymphadenopathy, thyromegaly. Heart: RRR, no murmur Lungs: normal effort, CTA Extremities: no cyanosis, clubbing or edema. Skin: warm and dry without rash. Psychologic: good mood and appropriate affect, normal speech and behavior.  See DM foot exam.      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  HYPERTENSION - Controlled. Plan: CBC with Differential  HYPERLIPIDEMIA - Plan: Lipid panel  Obesity - continue current efforts for weight loss.  MURMUR - aortic valvular disease.  Followed by Dr. Jens Som.  Need for influenza vaccination - Plan: Flu Vaccine QUAD 36+ mos IM  Need for pneumococcal vaccination - Plan: Pneumococcal polysaccharide vaccine 23-valent greater than or equal to 60yo subcutaneous/IM  Need for shingles vaccine - Plan: zoster vaccine live, PF, (ZOSTAVAX) 40981 UNT/0.65ML injection  Need for Tdap vaccination - Plan: Tdap vaccine greater than or equal to 7yo IM  Need for hepatitis C screening test - Plan: Hepatitis C antibody  Fernande Bras, PA-C Physician Assistant-Certified [facility]

## 2012-12-17 ENCOUNTER — Encounter: Payer: Self-pay | Admitting: Physician Assistant

## 2013-02-01 ENCOUNTER — Encounter: Payer: Self-pay | Admitting: Physician Assistant

## 2013-02-01 DIAGNOSIS — E1129 Type 2 diabetes mellitus with other diabetic kidney complication: Secondary | ICD-10-CM

## 2013-03-08 ENCOUNTER — Encounter: Payer: Self-pay | Admitting: Physician Assistant

## 2013-03-08 ENCOUNTER — Ambulatory Visit (INDEPENDENT_AMBULATORY_CARE_PROVIDER_SITE_OTHER): Payer: BC Managed Care – PPO | Admitting: Physician Assistant

## 2013-03-08 VITALS — BP 158/86 | HR 59 | Temp 98.6°F | Resp 16 | Ht 62.0 in | Wt 216.0 lb

## 2013-03-08 DIAGNOSIS — E7800 Pure hypercholesterolemia, unspecified: Secondary | ICD-10-CM

## 2013-03-08 DIAGNOSIS — E1129 Type 2 diabetes mellitus with other diabetic kidney complication: Secondary | ICD-10-CM

## 2013-03-08 DIAGNOSIS — E119 Type 2 diabetes mellitus without complications: Secondary | ICD-10-CM

## 2013-03-08 DIAGNOSIS — E785 Hyperlipidemia, unspecified: Secondary | ICD-10-CM

## 2013-03-08 DIAGNOSIS — R809 Proteinuria, unspecified: Secondary | ICD-10-CM

## 2013-03-08 DIAGNOSIS — I10 Essential (primary) hypertension: Secondary | ICD-10-CM

## 2013-03-08 LAB — COMPREHENSIVE METABOLIC PANEL
ALBUMIN: 4.2 g/dL (ref 3.5–5.2)
ALK PHOS: 57 U/L (ref 39–117)
ALT: 25 U/L (ref 0–35)
AST: 19 U/L (ref 0–37)
BUN: 14 mg/dL (ref 6–23)
CO2: 31 mEq/L (ref 19–32)
Calcium: 9.7 mg/dL (ref 8.4–10.5)
Chloride: 103 mEq/L (ref 96–112)
Creat: 0.58 mg/dL (ref 0.50–1.10)
GLUCOSE: 118 mg/dL — AB (ref 70–99)
Potassium: 3.7 mEq/L (ref 3.5–5.3)
SODIUM: 141 meq/L (ref 135–145)
TOTAL PROTEIN: 7 g/dL (ref 6.0–8.3)
Total Bilirubin: 0.3 mg/dL (ref 0.3–1.2)

## 2013-03-08 LAB — POCT GLYCOSYLATED HEMOGLOBIN (HGB A1C): Hemoglobin A1C: 5.8

## 2013-03-08 LAB — LIPID PANEL
Cholesterol: 167 mg/dL (ref 0–200)
HDL: 58 mg/dL (ref >39)
LDL CALC: 85 mg/dL (ref 0–99)
Total CHOL/HDL Ratio: 2.9 Ratio
Triglycerides: 122 mg/dL (ref <150)
VLDL: 24 mg/dL (ref 0–40)

## 2013-03-08 LAB — GLUCOSE, POCT (MANUAL RESULT ENTRY): POC Glucose: 115 mg/dl — AB (ref 70–99)

## 2013-03-08 LAB — MICROALBUMIN, URINE: MICROALB UR: 7.06 mg/dL — AB (ref 0.00–1.89)

## 2013-03-08 NOTE — Progress Notes (Signed)
Subjective:    Patient ID: Tracy Kennedy, female    DOB: Oct 04, 1950, 63 y.o.   MRN: [id number]  HPI  Tracy Kennedy is a 63 year old female who presents for follow up for DM type 2, HTN and hyperlipidemia.   Checking glucose at home every day before breakfast ranging from 110-130. Some morning she feels "like she is dragging" in the morning when her blood sugar is on the lower (110-120) side. Checks feet regularly.  She recently was married which was a stressful time and her blood sugars were elevated higher than normal (140) during this time. Her wife is currently having some medical issues that is causing her stress today.  Last saw eye specialist 10/14. Sees dentist every 6 months.  Blood pressures in November and December 2014 were 110-130/70. She has not been checking her BP at home since 01/23/13. Continues to take BP medication as prescribed. Is not supposed to have appointment with urologist again until 01/2014.  Current on flu shot, pneumonia shot and tetanus shot this season. Pharmacist recommended waiting for shingles vaccine when she last attempted to get it due to recently having the 3 injections earlier that day. She is planning on going to the pharmacy to get shingles vaccine today.  Diet is not what it should be at this point. Knows she needs to get back on track. Is trying to find a plan that works well for her.  Reports walking some but currently is having right foot pain.     Review of Systems Denies chest pain, palpitations, SOB, dizziness, vision changes, nausea, vomiting, diarrhea, constipation, melena, hematochezia, dysuria, increased urinary urgency/frequency, increased hunger or thirst and unintentional weight changes.  Reports "sinus headaches".     Objective:   Physical Exam  Vitals reviewed. Constitutional: She is oriented to person, place, and time. She appears well-developed and well-nourished.  HENT:  Head: Normocephalic and atraumatic.  Eyes: Conjunctivae  and EOM are normal. Pupils are equal, round, and reactive to light. No scleral icterus.  Fundoscopic exam:      The right eye shows no arteriolar narrowing, no AV nicking, no exudate, no hemorrhage and no papilledema. The right eye shows red reflex.       The left eye shows no arteriolar narrowing, no AV nicking, no exudate, no hemorrhage and no papilledema. The left eye shows red reflex.  Neck: Normal range of motion. Neck supple. Carotid bruit is not present. No thyromegaly present.  Cardiovascular: Normal rate, regular rhythm and intact distal pulses.   Murmur heard. Pulses:      Carotid pulses are 2+ on the right side, and 2+ on the left side. Pulmonary/Chest: Effort normal and breath sounds normal.  Musculoskeletal:       Right lower leg: She exhibits tenderness. She exhibits no swelling.       Left lower leg: She exhibits tenderness. She exhibits no swelling and no edema.  Lymphadenopathy:       Head (right side): No submental, no submandibular, no tonsillar, no preauricular and no posterior auricular adenopathy present.       Head (left side): No submental, no submandibular, no tonsillar, no preauricular and no posterior auricular adenopathy present.    She has no cervical adenopathy.       Right: No supraclavicular adenopathy present.       Left: No supraclavicular adenopathy present.  Neurological: She is alert and oriented to person, place, and time.  Skin: Skin is warm and dry.  Psychiatric: She has a normal mood and affect. Her behavior is normal. Judgment and thought content normal.     See diabetic foot exam      Assessment & Plan:    1. DM (diabetes mellitus), type 2 with renal complications Continue medications as prescribed. Encouraged diet and exercise.  - POCT glucose (manual entry) - POCT glycosylated hemoglobin (Hb A1C)  2. Persistent microalbuminuria associated with type II diabetes mellitus - Microalbumin, urine  3. HYPERTENSION BP elevated today but  most likely due to personal stress. Will continue current doses of medications for now and will re-evaluate in 3 months.  - Comprehensive metabolic panel  4. HYPERLIPIDEMIA Continue medications as prescribed. Encouraged health diet and exercise.  - Lipid panel

## 2013-03-08 NOTE — Progress Notes (Signed)
I have examined this patient along with the student and agree.  

## 2013-03-08 NOTE — Patient Instructions (Signed)
Congratulations to you and Tracy Kennedy! Please bring a photo from your wedding at your next appointment. Keep up the great work.

## 2013-03-21 ENCOUNTER — Other Ambulatory Visit: Payer: Self-pay | Admitting: Physician Assistant

## 2013-03-22 ENCOUNTER — Other Ambulatory Visit: Payer: Self-pay

## 2013-03-22 MED ORDER — GLUCOSE BLOOD VI STRP: ORAL_STRIP | Status: DC

## 2013-05-17 ENCOUNTER — Other Ambulatory Visit: Payer: Self-pay | Admitting: Physician Assistant

## 2013-06-06 ENCOUNTER — Other Ambulatory Visit: Payer: Self-pay | Admitting: Physician Assistant

## 2013-06-07 ENCOUNTER — Other Ambulatory Visit: Payer: Self-pay | Admitting: Physician Assistant

## 2013-06-09 ENCOUNTER — Ambulatory Visit (INDEPENDENT_AMBULATORY_CARE_PROVIDER_SITE_OTHER): Payer: BC Managed Care – PPO | Admitting: Physician Assistant

## 2013-06-09 ENCOUNTER — Encounter: Payer: Self-pay | Admitting: Physician Assistant

## 2013-06-09 VITALS — BP 164/88 | HR 52 | Temp 98.4°F | Resp 16 | Ht 61.75 in | Wt 212.8 lb

## 2013-06-09 DIAGNOSIS — R809 Proteinuria, unspecified: Secondary | ICD-10-CM

## 2013-06-09 DIAGNOSIS — E1129 Type 2 diabetes mellitus with other diabetic kidney complication: Secondary | ICD-10-CM

## 2013-06-09 DIAGNOSIS — E669 Obesity, unspecified: Secondary | ICD-10-CM

## 2013-06-09 DIAGNOSIS — I10 Essential (primary) hypertension: Secondary | ICD-10-CM

## 2013-06-09 DIAGNOSIS — E785 Hyperlipidemia, unspecified: Secondary | ICD-10-CM

## 2013-06-09 LAB — COMPLETE METABOLIC PANEL WITH GFR
ALT: 24 U/L (ref 0–35)
AST: 19 U/L (ref 0–37)
Albumin: 4.2 g/dL (ref 3.5–5.2)
Alkaline Phosphatase: 64 U/L (ref 39–117)
BUN: 13 mg/dL (ref 6–23)
CO2: 29 meq/L (ref 19–32)
CREATININE: 0.54 mg/dL (ref 0.50–1.10)
Calcium: 9.5 mg/dL (ref 8.4–10.5)
Chloride: 102 mEq/L (ref 96–112)
GFR, Est Non African American: >89 mL/min
GLUCOSE: 110 mg/dL — AB (ref 70–99)
Potassium: 3.5 mEq/L (ref 3.5–5.3)
Sodium: 140 mEq/L (ref 135–145)
Total Bilirubin: 0.5 mg/dL (ref 0.2–1.2)
Total Protein: 7.2 g/dL (ref 6.0–8.3)

## 2013-06-09 LAB — GLUCOSE, POCT (MANUAL RESULT ENTRY): POC Glucose: 92 mg/dl (ref 70–99)

## 2013-06-09 LAB — LIPID PANEL
CHOLESTEROL: 165 mg/dL (ref 0–200)
HDL: 53 mg/dL (ref >39)
LDL Cholesterol: 84 mg/dL (ref 0–99)
TRIGLYCERIDES: 142 mg/dL (ref <150)
Total CHOL/HDL Ratio: 3.1 Ratio
VLDL: 28 mg/dL (ref 0–40)

## 2013-06-09 LAB — POCT GLYCOSYLATED HEMOGLOBIN (HGB A1C): Hemoglobin A1C: 6

## 2013-06-09 MED ORDER — LOVASTATIN 40 MG PO TABS: 40.0000 mg | ORAL_TABLET | Freq: Every day | ORAL | Status: DC

## 2013-06-09 MED ORDER — ATENOLOL-CHLORTHALIDONE 50-25 MG PO TABS: ORAL_TABLET | ORAL | Status: DC

## 2013-06-09 MED ORDER — OMEPRAZOLE 20 MG PO CPDR: 20.0000 mg | DELAYED_RELEASE_CAPSULE | Freq: Every day | ORAL | Status: DC

## 2013-06-09 MED ORDER — VERAPAMIL HCL ER 360 MG PO CP24: 360.0000 mg | ORAL_CAPSULE | Freq: Every day | ORAL | Status: DC

## 2013-06-09 MED ORDER — LISINOPRIL 40 MG PO TABS: ORAL_TABLET | ORAL | Status: DC

## 2013-06-09 MED ORDER — POTASSIUM CHLORIDE CRYS ER 20 MEQ PO TBCR: 20.0000 meq | EXTENDED_RELEASE_TABLET | Freq: Every day | ORAL | Status: DC

## 2013-06-09 NOTE — Patient Instructions (Addendum)
I will contact you with your lab results as soon as they are available.   If you have not heard from me in 2 weeks, please contact me.  The fastest way to get your results is to register for My Chart (see the instructions on the last page of this printout).  Check your blood pressure at home, 2 times each week. If it runs above 140/90 on a regular basis, we'll need to adjust your medication.

## 2013-06-09 NOTE — Progress Notes (Signed)
Subjective:    Patient ID: Tracy Kennedy, female    DOB: 02/21/1950, 63 y.o.   MRN: [id number]  HPI  Patient Active Problem List   Diagnosis Date Noted  . 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 Obesity   . DM (diabetes mellitus), type 2 with renal complications   . AORTIC VALVE DISORDERS 10/26/2008  . HYPERLIPIDEMIA 10/25/2008  . HYPERTENSION 10/25/2008  . ASTHMA 10/25/2008  . GERD 10/25/2008  . MURMUR 10/25/2008  . CHEST PAIN 10/25/2008     63y.o female presents for 3 month DM check.  Pt is now on a gluten free diet in support of her partner.  Has checked sugar every day to be around 110-120.  Denies increase thirst or urination.  Pt in need of refill for numerous medications.   Pt concerned about swelling in her feet more so on R.  She has noticed the swelling at the end of the day for the past few days.  She has not been on her feet more than normal in past few days.  Denies SOB, orthopnea, PND, unilateral calf pain.  Also, denies more pain than usual with her arthritis.  Zoster vaccine received on 1/31.     Review of Systems  Constitutional: Negative.   HENT: Negative.   Eyes: Negative.   Respiratory: Negative for cough, shortness of breath and wheezing.   Cardiovascular: Negative.   Gastrointestinal: Negative.   Endocrine: Negative.   Genitourinary: Negative for dysuria, frequency, hematuria, flank pain and difficulty urinating.  Musculoskeletal: Negative.   Skin: Negative for rash.  Neurological: Negative for dizziness, weakness, light-headedness, numbness and headaches.  Hematological: Negative.        Objective:   Physical Exam  Constitutional: She is oriented to person, place, and time. She appears well-developed and well-nourished. No distress.  BP 164/88  Pulse 52  Temp(Src) 98.4 F (36.9 C) (Oral)  Resp 16  Ht 5' 1.75" (1.568 m)  Wt 212 lb 12.8 oz (96.525 kg)  BMI 39.26 kg/m2  SpO2 96%  LMP  01/10/2000   HENT:  Head: Normocephalic.  Eyes: Conjunctivae are normal. Pupils are equal, round, and reactive to light.  Neck: Normal range of motion.  Cardiovascular: Normal rate, regular rhythm and intact distal pulses.  Exam reveals no gallop and no friction rub.   Murmur (Systolic flow murmur heard best at 2nd R intercostal) heard. Pulmonary/Chest: Effort normal and breath sounds normal. No respiratory distress. She has no wheezes. She exhibits no tenderness.  Musculoskeletal: She exhibits no edema (No peripheral edema).  Lymphadenopathy:    She has no cervical adenopathy.  Neurological: She is alert and oriented to person, place, and time.  Skin: Skin is warm and dry. No rash noted.  Psychiatric: She has a normal mood and affect. Her behavior is normal.    Results for orders placed in visit on 06/09/13  GLUCOSE, POCT (MANUAL RESULT ENTRY)      Result Value Ref Range   POC Glucose 92  70 - 99 mg/dl  POCT GLYCOSYLATED HEMOGLOBIN (HGB A1C)      Result Value Ref Range   Hemoglobin A1C 6.0           Assessment & Plan:   1. HYPERTENSION Continue current treatment plan. - potassium chloride SA (K-DUR,KLOR-CON) 20 MEQ tablet; Take 1 tablet (20 mEq total) by mouth daily.  Dispense: 90 tablet; Refill: 3 - verapamil (VERELAN PM) 360 MG 24 hr capsule; Take 1 capsule (360 mg  total) by mouth daily.  Dispense: 90 capsule; Refill: 3  2. DM (diabetes mellitus), type 2 with renal complications Continue current treatment plan.  A1C of 5.8 on 1/27.  Today A1C of 6.0. - HM Diabetes Foot Exam - POCT glucose (manual entry) - POCT glycosylated hemoglobin (Hb A1C) - COMPLETE METABOLIC PANEL WITH GFR - Microalbumin, urine - HM Diabetes Eye Exam  3. Persistent microalbuminuria associated with type II diabetes mellitus - Microalbumin, urine  4. Obesity   5. HYPERLIPIDEMIA Continue with current treatment plan. - Lipid panel - lovastatin (MEVACOR) 40 MG tablet; Take 1 tablet (40 mg  total) by mouth at bedtime.  Dispense: 90 tablet; Refill: 3

## 2013-06-09 NOTE — Progress Notes (Signed)
I have examined this patient along with the student and agree.  Healthy lifestyle changes encouraged.  She's trying a new diet: 1-1-1. Return in about 3 months (around 09/08/2013).

## 2013-06-10 LAB — MICROALBUMIN, URINE: Microalb, Ur: 4.86 mg/dL — ABNORMAL HIGH (ref 0.00–1.89)

## 2013-06-14 ENCOUNTER — Ambulatory Visit: Payer: BC Managed Care – PPO | Admitting: Physician Assistant

## 2013-07-05 ENCOUNTER — Ambulatory Visit: Payer: BC Managed Care – PPO | Admitting: Physician Assistant

## 2013-07-18 ENCOUNTER — Encounter: Payer: Self-pay | Admitting: Physician Assistant

## 2013-07-18 NOTE — Telephone Encounter (Signed)
Please call patient and advise her that Chelle is out of the office for the week. She will review the readings then.

## 2013-08-04 ENCOUNTER — Telehealth: Payer: Self-pay

## 2013-08-04 NOTE — Telephone Encounter (Signed)
PA needed for omeprazole. Completed by Huntley DecSara on covermymeds on 08/02/13, but received notice from [employer] that it is not their pt. Resubmitted [employer] form on covermymeds 08/04/13.Marland Kitchen. Pt verified that she has been on this for several years prior to being Rxd it by us starting 02/25/11. She has only ever used it except for Pepcid which was ineffective.

## 2013-08-12 NOTE — Telephone Encounter (Signed)
Received another PA form by fax and completed and faxed back.

## 2013-08-15 NOTE — Telephone Encounter (Signed)
PA approved through 08/15/14. Notified pharm and asked them to notify pt when Rx ready.

## 2013-08-16 DIAGNOSIS — B351 Tinea unguium: Secondary | ICD-10-CM

## 2013-09-13 ENCOUNTER — Other Ambulatory Visit: Payer: Self-pay | Admitting: Physician Assistant

## 2013-09-13 ENCOUNTER — Ambulatory Visit: Payer: BC Managed Care – PPO | Admitting: Physician Assistant

## 2013-09-20 ENCOUNTER — Other Ambulatory Visit: Payer: Self-pay

## 2013-09-20 DIAGNOSIS — Z1231 Encounter for screening mammogram for malignant neoplasm of breast: Secondary | ICD-10-CM

## 2013-09-23 ENCOUNTER — Ambulatory Visit: Payer: Self-pay

## 2013-09-23 ENCOUNTER — Ambulatory Visit
Admission: RE | Admit: 2013-09-23 | Discharge: 2013-09-23 | Disposition: A | Discharge status: Home / Self Care | Payer: BC Managed Care – PPO | Source: Ambulatory Visit

## 2013-09-23 ENCOUNTER — Other Ambulatory Visit: Payer: Self-pay

## 2013-09-23 DIAGNOSIS — Z1231 Encounter for screening mammogram for malignant neoplasm of breast: Secondary | ICD-10-CM

## 2013-09-27 ENCOUNTER — Telehealth: Payer: Self-pay | Admitting: *Deleted

## 2013-09-27 NOTE — Telephone Encounter (Signed)
Spoke with Tracy BoerVicki Kennedy on September 27, 2013 at 9:21 am regarding coming to the office for a 3 month checkup on Diabetes. Pt stated she would call and make an appointment when she found the time to do so.  She understood the purpose of the follow-up appointment.

## 2013-10-30 ENCOUNTER — Ambulatory Visit (INDEPENDENT_AMBULATORY_CARE_PROVIDER_SITE_OTHER): Payer: BC Managed Care – PPO | Admitting: Physician Assistant

## 2013-10-30 VITALS — BP 130/74 | HR 56 | Temp 98.2°F | Resp 18 | Ht 63.0 in | Wt 215.2 lb

## 2013-10-30 DIAGNOSIS — E119 Type 2 diabetes mellitus without complications: Secondary | ICD-10-CM

## 2013-10-30 DIAGNOSIS — R5383 Other fatigue: Secondary | ICD-10-CM

## 2013-10-30 DIAGNOSIS — I10 Essential (primary) hypertension: Secondary | ICD-10-CM

## 2013-10-30 DIAGNOSIS — R5381 Other malaise: Secondary | ICD-10-CM

## 2013-10-30 DIAGNOSIS — R0609 Other forms of dyspnea: Secondary | ICD-10-CM

## 2013-10-30 DIAGNOSIS — R809 Proteinuria, unspecified: Secondary | ICD-10-CM

## 2013-10-30 DIAGNOSIS — E1129 Type 2 diabetes mellitus with other diabetic kidney complication: Secondary | ICD-10-CM

## 2013-10-30 DIAGNOSIS — J069 Acute upper respiratory infection, unspecified: Secondary | ICD-10-CM

## 2013-10-30 DIAGNOSIS — E669 Obesity, unspecified: Secondary | ICD-10-CM | POA: Insufficient documentation

## 2013-10-30 DIAGNOSIS — R0683 Snoring: Secondary | ICD-10-CM

## 2013-10-30 DIAGNOSIS — R5382 Chronic fatigue, unspecified: Secondary | ICD-10-CM

## 2013-10-30 DIAGNOSIS — N189 Chronic kidney disease, unspecified: Secondary | ICD-10-CM

## 2013-10-30 DIAGNOSIS — E1122 Type 2 diabetes mellitus with diabetic chronic kidney disease: Secondary | ICD-10-CM

## 2013-10-30 DIAGNOSIS — J45909 Unspecified asthma, uncomplicated: Secondary | ICD-10-CM

## 2013-10-30 DIAGNOSIS — B9789 Other viral agents as the cause of diseases classified elsewhere: Secondary | ICD-10-CM

## 2013-10-30 DIAGNOSIS — E785 Hyperlipidemia, unspecified: Secondary | ICD-10-CM

## 2013-10-30 DIAGNOSIS — R0989 Other specified symptoms and signs involving the circulatory and respiratory systems: Secondary | ICD-10-CM

## 2013-10-30 LAB — GLUCOSE, POCT (MANUAL RESULT ENTRY): POC Glucose: 110 mg/dl — AB (ref 70–99)

## 2013-10-30 LAB — LIPID PANEL
Cholesterol: 164 mg/dL (ref 0–200)
HDL: 50 mg/dL (ref >39)
LDL Cholesterol: 85 mg/dL (ref 0–99)
Total CHOL/HDL Ratio: 3.3 Ratio
Triglycerides: 147 mg/dL (ref <150)
VLDL: 29 mg/dL (ref 0–40)

## 2013-10-30 LAB — COMPREHENSIVE METABOLIC PANEL
ALT: 23 U/L (ref 0–35)
AST: 18 U/L (ref 0–37)
Albumin: 4.1 g/dL (ref 3.5–5.2)
Alkaline Phosphatase: 59 U/L (ref 39–117)
BILIRUBIN TOTAL: 0.3 mg/dL (ref 0.2–1.2)
BUN: 15 mg/dL (ref 6–23)
CO2: 31 meq/L (ref 19–32)
CREATININE: 0.65 mg/dL (ref 0.50–1.10)
Calcium: 9.5 mg/dL (ref 8.4–10.5)
Chloride: 102 mEq/L (ref 96–112)
GLUCOSE: 109 mg/dL — AB (ref 70–99)
Potassium: 3.5 mEq/L (ref 3.5–5.3)
Sodium: 142 mEq/L (ref 135–145)
Total Protein: 7 g/dL (ref 6.0–8.3)

## 2013-10-30 LAB — TSH: TSH: 0.65 u[IU]/mL (ref 0.350–4.500)

## 2013-10-30 LAB — POCT GLYCOSYLATED HEMOGLOBIN (HGB A1C): Hemoglobin A1C: 5.9

## 2013-10-30 LAB — MICROALBUMIN, URINE: Microalb, Ur: 7.15 mg/dL — ABNORMAL HIGH (ref 0.00–1.89)

## 2013-10-30 MED ORDER — BECLOMETHASONE DIPROPIONATE 80 MCG/ACT IN AERS: 2.0000 | INHALATION_SPRAY | Freq: Two times a day (BID) | RESPIRATORY_TRACT | Status: DC

## 2013-10-30 MED ORDER — IPRATROPIUM BROMIDE 0.03 % NA SOLN: 2.0000 | Freq: Two times a day (BID) | NASAL | Status: DC

## 2013-10-30 NOTE — Patient Instructions (Addendum)
I will contact you with your lab results as soon as they are available.   If you have not heard from me in 2 weeks, please contact me.  The fastest way to get your results is to register for My Chart (see the instructions on the last page of this printout).  Contact me with the name of the nutritionist you'd like to see if you need a referral.  Get your flu vaccine once you are well.

## 2013-10-30 NOTE — Progress Notes (Signed)
Subjective:    Patient ID: Tracy Kennedy, female    DOB: 1951/01/10, 63 y.o.   MRN: [id number]   PCP: Ziquan Fidel,Ediberto Sens, PA-C  Chief Complaint  Patient presents with  . Follow-up    4 month check up on diabetes  . Cough    just started--tightness was notice on Friday--dry cough  . Sore Throat    Medications, allergies, past medical history, surgical history, family history, social history and problem list reviewed and updated.  Patient Active Problem List   Diagnosis Date Noted  . 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 Obesity   . DM (diabetes mellitus), type 2 with renal complications   . AORTIC VALVE DISORDERS 10/26/2008  . HYPERLIPIDEMIA 10/25/2008  . HYPERTENSION 10/25/2008  . ASTHMA 10/25/2008  . GERD 10/25/2008  . MURMUR 10/25/2008  . CHEST PAIN 10/25/2008    Prior to Admission medications   Medication Sig Start Date End Date Taking? Authorizing Provider  albuterol (PROVENTIL HFA;VENTOLIN HFA) 108 (90 BASE) MCG/ACT inhaler Inhale 2 puffs into the lungs every 6 (six) hours as needed. 11/24/11  Yes Tyshika Baldridge S Marrian Bells, PA-C  aspirin 81 MG tablet Take 81 mg by mouth daily.   Yes Historical Provider, MD  atenolol-chlorthalidone (TENORETIC) 50-25 MG per tablet TAKE 1 TABLET BY MOUTH DAILY 06/09/13  Yes Vittoria Noreen S Johannes Everage, PA-C  BIOTIN PO Take by mouth daily.   Yes Historical Provider, MD  Blood Glucose Monitoring Suppl (BLOOD GLUCOSE MONITOR KIT) KIT Use to test blood sugar daily. 10/11/12  Yes Collan Schoenfeld S Christphor Groft, PA-C  CALCIUM PO Take by mouth.   Yes Historical Provider, MD  CINNAMON PO Take by mouth daily.   Yes Historical Provider, MD  GLUCOSAMINE PO Take by mouth.   Yes Historical Provider, MD  glucose blood test strip Use as instructed 10/11/12  Yes Ivette Castronova S Louvenia Golomb, PA-C  glucose blood test strip Test blood sugar daily. Dx code 250.00 03/22/13  Yes Tarun Patchell Janalee Dane, PA-C  Lancet Devices (LANCING DEVICE) MISC Use to test blood sugar  daily. One box 10/20/12  Yes Fara Chute, PA-C  Lancets MISC Use to test blood sugar daily 10/11/12  Yes Jihaad Bruschi S Lenix Benoist, PA-C  lisinopril (PRINIVIL,ZESTRIL) 40 MG tablet TAKE 1 TABLET BY MOUTH DAILY 06/09/13  Yes Florinda Taflinger S Nhi Butrum, PA-C  lovastatin (MEVACOR) 40 MG tablet Take 1 tablet (40 mg total) by mouth at bedtime. 06/09/13  Yes Curran Lenderman S Maidie Streight, PA-C  omeprazole (PRILOSEC) 20 MG capsule Take 1 capsule (20 mg total) by mouth daily. 06/09/13  Yes Arthor Gorter S Jamieson Lisa, PA-C  potassium chloride SA (K-DUR,KLOR-CON) 20 MEQ tablet Take 1 tablet (20 mEq total) by mouth daily. 06/09/13  Yes Lilliah Priego S Arrion Broaddus, PA-C  verapamil (VERELAN PM) 360 MG 24 hr capsule Take 1 capsule (360 mg total) by mouth daily. 06/09/13  Yes Kindrick Lankford S Sharetta Ricchio, PA-C  WELCHOL 625 MG tablet TAKE 3 TABLETS BY MOUTH TWICE DAILY WITH A MEAL   Yes Javiana Anwar S Mikenzie Mccannon, PA-C    HPI  This 63 y.o. female presents for evaluation of DM type 2, HTN, lipids.  Making some life changes. 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 Increasing fiber and water, getting back into exercise. 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 I can't find my energy any more."  Ready for nutrition counseling with a person she recently found that  her insurance will pay for.  Home BP log reviewed. 130-150's/70-80's.  Frequency of home glucose monitoring: QAM; 110's-130's Sees a dentist Q6 months, eye specialist annually. Checks feet daily. Is not current with this year's seasonal influenza vaccine. Will get it today if I think it's OK with her current illness. Is current with pneumococcal vaccine. Needs a booster Pneumovax and Prevnar 15 at age 12.  Concerned that she has bronchitis. 2 days of cough, non-productive, sore throat. Chest feels tighter-using albuterol inhaler with good results. More congested than usual. No Fever, chills. No GI/GU symptoms.   Review  of Systems As above. Denies chest pain, shortness of breath, HA, dizziness, vision change, nausea, vomiting, diarrhea, constipation, melena, hematochezia, dysuria, increased urinary urgency or frequency, increased hunger or thirst, unintentional weight change, unexplained myalgias or arthralgias, rash.     Objective:   Physical Exam  Vitals reviewed. Constitutional: She is oriented to person, place, and time. She appears well-developed and well-nourished. No distress.  BP 130/74  Pulse 56  Temp(Src) 98.2 F (36.8 C) (Oral)  Resp 18  Ht 5' 3"  (1.6 m)  Wt 215 lb 3.2 oz (97.614 kg)  BMI 38.13 kg/m2  SpO2 96%  LMP 01/10/2000   HENT:  Head: Normocephalic and atraumatic.  Right Ear: Hearing, tympanic membrane, external ear and ear canal normal.  Left Ear: Hearing, tympanic membrane, external ear and ear canal normal.  Nose: Mucosal edema present.  Mouth/Throat: Uvula is midline, oropharynx is clear and moist and mucous membranes are normal. No oropharyngeal exudate.  Eyes: Conjunctivae are normal. No scleral icterus.  Neck: Normal range of motion, full passive range of motion without pain and phonation normal. Neck supple. No thyromegaly present.  Cardiovascular: Normal rate, regular rhythm, normal heart sounds and intact distal pulses.   Pulmonary/Chest: Effort normal and breath sounds normal.  Lymphadenopathy:    She has no cervical adenopathy.  Neurological: She is alert and oriented to person, place, and time.  Skin: Skin is warm, dry and intact.  Psychiatric: She has a normal mood and affect. Her speech is normal and behavior is normal.   See diabetic foot exam.  Results for orders placed in visit on 10/30/13  GLUCOSE, POCT (MANUAL RESULT ENTRY)      Result Value Ref Range   POC Glucose 110 (*) 70 - 99 mg/dl  POCT GLYCOSYLATED HEMOGLOBIN (HGB A1C)      Result Value Ref Range   Hemoglobin A1C 5.9         Assessment & Plan:  1. Type 2 diabetes mellitus with diabetic  chronic kidney disease 2. Persistent microalbuminuria associated with type II diabetes mellitus Good control. Continue current treatment and monitoring. - POCT glucose (manual entry) - POCT glycosylated hemoglobin (Hb A1C) - Microalbumin, urine - Comprehensive metabolic panel    3. ASTHMA Well controlled without maintenance medication.  See below for current illness treatment.  4. HYPERTENSION Reasonable control, though will likely need to be lower.  Hopefully weight loss will prevent the need for additional meds.  Evaluate for OSA-see below. - TSH  5. HYPERLIPIDEMIA Await labs. - Lipid panel  6. Chronic fatigue She snores, but doesn't know if she has pauses, etc. Await labs.  - Vitamin D 1,25 dihydroxy - Ambulatory referral to Sleep Studies  7. Viral URI with cough Supportive care. - ipratropium (ATROVENT) 0.03 % nasal spray; Place 2 sprays into both nostrils 2 (two) times daily.  Dispense: 30 mL; Refill: 0 - beclomethasone (QVAR) 80 MCG/ACT inhaler; Inhale 2  puffs into the lungs 2 (two) times daily.  Dispense: 1 Inhaler; Refill: 1  8. Snoring Has been referred before but never scheduled the study. - Ambulatory referral to Sleep Studies  When she is well, return for flu vaccine. Otherwise, follow-up in 3 months.  Fara Chute, PA-C Physician Assistant-Certified [facility]

## 2013-11-03 ENCOUNTER — Other Ambulatory Visit: Payer: Self-pay | Admitting: Physician Assistant

## 2013-11-03 LAB — VITAMIN D 1,25 DIHYDROXY
VITAMIN D 1, 25 (OH) TOTAL: 42 pg/mL (ref 18–72)
Vitamin D3 1, 25 (OH)2: 42 pg/mL

## 2013-11-25 ENCOUNTER — Other Ambulatory Visit: Payer: Self-pay

## 2013-12-27 LAB — HM DIABETES EYE EXAM

## 2013-12-29 ENCOUNTER — Other Ambulatory Visit: Payer: Self-pay | Admitting: Physician Assistant

## 2014-01-06 ENCOUNTER — Encounter: Payer: Self-pay | Admitting: Physician Assistant

## 2014-02-01 ENCOUNTER — Encounter: Payer: Self-pay | Admitting: Physician Assistant

## 2014-02-01 DIAGNOSIS — R809 Proteinuria, unspecified: Principal | ICD-10-CM

## 2014-02-01 DIAGNOSIS — E1129 Type 2 diabetes mellitus with other diabetic kidney complication: Secondary | ICD-10-CM

## 2014-03-14 ENCOUNTER — Ambulatory Visit (INDEPENDENT_AMBULATORY_CARE_PROVIDER_SITE_OTHER): Payer: BC Managed Care – PPO | Admitting: Physician Assistant

## 2014-03-14 ENCOUNTER — Encounter: Payer: Self-pay | Admitting: Physician Assistant

## 2014-03-14 VITALS — BP 156/82 | HR 49 | Temp 98.8°F | Resp 16 | Ht 62.0 in | Wt 216.0 lb

## 2014-03-14 DIAGNOSIS — J069 Acute upper respiratory infection, unspecified: Secondary | ICD-10-CM

## 2014-03-14 DIAGNOSIS — E669 Obesity, unspecified: Secondary | ICD-10-CM

## 2014-03-14 DIAGNOSIS — I10 Essential (primary) hypertension: Secondary | ICD-10-CM

## 2014-03-14 DIAGNOSIS — E785 Hyperlipidemia, unspecified: Secondary | ICD-10-CM

## 2014-03-14 DIAGNOSIS — J45909 Unspecified asthma, uncomplicated: Secondary | ICD-10-CM

## 2014-03-14 DIAGNOSIS — B9789 Other viral agents as the cause of diseases classified elsewhere: Secondary | ICD-10-CM

## 2014-03-14 DIAGNOSIS — N189 Chronic kidney disease, unspecified: Secondary | ICD-10-CM

## 2014-03-14 DIAGNOSIS — E1122 Type 2 diabetes mellitus with diabetic chronic kidney disease: Secondary | ICD-10-CM

## 2014-03-14 DIAGNOSIS — E119 Type 2 diabetes mellitus without complications: Secondary | ICD-10-CM

## 2014-03-14 DIAGNOSIS — E1129 Type 2 diabetes mellitus with other diabetic kidney complication: Secondary | ICD-10-CM

## 2014-03-14 DIAGNOSIS — J45901 Unspecified asthma with (acute) exacerbation: Secondary | ICD-10-CM

## 2014-03-14 DIAGNOSIS — R809 Proteinuria, unspecified: Secondary | ICD-10-CM

## 2014-03-14 LAB — POCT GLYCOSYLATED HEMOGLOBIN (HGB A1C): HEMOGLOBIN A1C: 6.2

## 2014-03-14 LAB — COMPLETE METABOLIC PANEL WITH GFR
ALBUMIN: 3.9 g/dL (ref 3.5–5.2)
ALT: 20 U/L (ref 0–35)
AST: 17 U/L (ref 0–37)
Alkaline Phosphatase: 55 U/L (ref 39–117)
BUN: 18 mg/dL (ref 6–23)
CHLORIDE: 102 meq/L (ref 96–112)
CO2: 28 meq/L (ref 19–32)
CREATININE: 0.66 mg/dL (ref 0.50–1.10)
Calcium: 9.8 mg/dL (ref 8.4–10.5)
GFR, Est Non African American: >89 mL/min
Glucose, Bld: 104 mg/dL — ABNORMAL HIGH (ref 70–99)
Potassium: 3.7 mEq/L (ref 3.5–5.3)
Sodium: 138 mEq/L (ref 135–145)
Total Bilirubin: 0.4 mg/dL (ref 0.2–1.2)
Total Protein: 7.2 g/dL (ref 6.0–8.3)

## 2014-03-14 LAB — PULMONARY FUNCTION TEST

## 2014-03-14 LAB — GLUCOSE, POCT (MANUAL RESULT ENTRY): POC GLUCOSE: 124 mg/dL — AB (ref 70–99)

## 2014-03-14 MED ORDER — BECLOMETHASONE DIPROPIONATE 80 MCG/ACT IN AERS: 2.0000 | INHALATION_SPRAY | Freq: Two times a day (BID) | RESPIRATORY_TRACT | Status: DC

## 2014-03-14 MED ORDER — ALBUTEROL SULFATE HFA 108 (90 BASE) MCG/ACT IN AERS: 2.0000 | INHALATION_SPRAY | Freq: Four times a day (QID) | RESPIRATORY_TRACT | Status: DC | PRN

## 2014-03-14 NOTE — Progress Notes (Signed)
Subjective:    Patient ID: Tracy Kennedy, female    DOB: 1950/07/12, 64 y.o.   MRN: [id number]   PCP: Cloie Wooden,Kelby Lotspeich, PA-C  Chief Complaint  Patient presents with  . Follow-up  . Diabetes  . Hypertension    Allergies  Allergen Reactions  . Codeine     REACTION: nausea    Patient Active Problem List   Diagnosis Date Noted  . Obesity (BMI 30-39.9) 10/30/2013  . 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 DM (diabetes mellitus), type 2 with renal complications   . AORTIC VALVE DISORDERS 10/26/2008  . Hyperlipidemia 10/25/2008  . Essential hypertension 10/25/2008  . Asthma 10/25/2008  . GERD 10/25/2008  . MURMUR 10/25/2008  . CHEST PAIN 10/25/2008    Prior to Admission medications   Medication Sig Start Date End Date Taking? Authorizing Provider  ACCU-CHEK AVIVA PLUS test strip TEST BLOOD SUGARS ONE TIME DAILY AS DIRECTED 11/03/13  Yes Mancel Bale, PA-C  albuterol (PROVENTIL HFA;VENTOLIN HFA) 108 (90 BASE) MCG/ACT inhaler Inhale 2 puffs into the lungs every 6 (six) hours as needed. 03/14/14  Yes Torris House S Homer Pfeifer, PA-C  aspirin 81 MG tablet Take 81 mg by mouth daily.   Yes Historical Provider, MD  atenolol-chlorthalidone (TENORETIC) 50-25 MG per tablet TAKE 1 TABLET BY MOUTH DAILY 06/09/13  Yes Hermen Mario S Wafa Martes, PA-C  BIOTIN PO Take by mouth daily.   Yes Historical Provider, MD  Blood Glucose Monitoring Suppl (BLOOD GLUCOSE MONITOR KIT) KIT Use to test blood sugar daily. 10/11/12  Yes Ashlea Dusing S Jodye Scali, PA-C  CALCIUM PO Take by mouth.   Yes Historical Provider, MD  CINNAMON PO Take by mouth daily.   Yes Historical Provider, MD  GLUCOSAMINE PO Take by mouth.   Yes Historical Provider, MD  ipratropium (ATROVENT) 0.03 % nasal spray Place 2 sprays into both nostrils 2 (two) times daily. 10/30/13  Yes Fara Chute, PA-C  Lancet Devices (LANCING DEVICE) MISC Use to test blood sugar daily. One box 10/20/12  Yes Fara Chute, PA-C  Lancets  MISC Use to test blood sugar daily 10/11/12  Yes Ariaunna Longsworth S Damoni Causby, PA-C  lisinopril (PRINIVIL,ZESTRIL) 40 MG tablet TAKE 1 TABLET BY MOUTH DAILY 06/09/13  Yes Ashlynd Michna S Brylei Pedley, PA-C  lovastatin (MEVACOR) 40 MG tablet Take 1 tablet (40 mg total) by mouth at bedtime. 06/09/13  Yes Damier Disano S Salaam Battershell, PA-C  omeprazole (PRILOSEC) 20 MG capsule Take 1 capsule (20 mg total) by mouth daily. 06/09/13  Yes Yesennia Hirota S Kersti Scavone, PA-C  potassium chloride SA (K-DUR,KLOR-CON) 20 MEQ tablet Take 1 tablet (20 mEq total) by mouth daily. 06/09/13  Yes Desiree Daise S Jaklyn Alen, PA-C  verapamil (VERELAN PM) 360 MG 24 hr capsule Take 1 capsule (360 mg total) by mouth daily. 06/09/13  Yes Janei Scheff S Antonyo Hinderer, PA-C  WELCHOL 625 MG tablet TAKE 3 TABLETS BY MOUTH TWICE DAILY WITH A MEAL 12/29/13  Yes Wolfe Camarena S Drucilla Cumber, PA-C  beclomethasone (QVAR) 80 MCG/ACT inhaler Inhale 2 puffs into the lungs 2 (two) times daily. 03/14/14   Fara Chute, PA-C    Medical, Surgical, Family and Social History reviewed and updated.  HPI  Tracy Kennedy presents for follow-up of controlled diabetes with microalbuminuria and controlled HTN, also hyperlipidemia and asthma, both also well controlled. No adverse medication effects.   However, she's had increased dyspnea these days. No longer using Qvar-it was prescribed as part of acute bronchitis treatment, needing rescue inhaler more frequently.  Home BP 488'Q-916'X systolic.  No exercise. Work is now Network engineer work. Had noted increased belly fat in the upper abdomen.  Frequency of home glucose monitoring: QAM, 110's-130 Sees a dentist Q6 months (03/15/2014), eye specialist annually 11/2013). Checks feet daily. Is current with influenza vaccine. Is current with pneumococcal vaccine.   Review of Systems As above. Denies chest pain, HA, dizziness, vision change, nausea, vomiting, diarrhea, constipation, melena, hematochezia, dysuria, increased urinary urgency or frequency, increased hunger or thirst, unintentional  weight change, unexplained myalgias or arthralgias, rash.     Objective:   Physical Exam  Constitutional: She is oriented to person, place, and time. She appears well-developed and well-nourished. No distress.  BP 156/82 mmHg  Pulse 49  Temp(Src) 98.8 F (37.1 C)  Resp 16  Ht 5' 2"  (1.575 m)  Wt 216 lb (97.977 kg)  BMI 39.50 kg/m2  SpO2 98%  LMP 01/10/2000   Eyes: Conjunctivae are normal. No scleral icterus.  Neck: No thyromegaly present.  Cardiovascular: Normal rate, regular rhythm, normal heart sounds and intact distal pulses.   Pulmonary/Chest: Effort normal and breath sounds normal.  Lymphadenopathy:    She has no cervical adenopathy.  Neurological: She is alert and oriented to person, place, and time.  Skin: Skin is warm and dry.  Psychiatric: She has a normal mood and affect. Her behavior is normal.    Office Spirometry Results: Restriction, with small airway results consistent with asthma. FEV1: 1.03 liters FVC: 1.43 liters FEV1/FVC: 72 % FVC  % Predicted: 47 liters FEV % Predicted: 44 liters FeF 25-75: 0.76 liters FeF 25-75 % Predicted: 35  Diabetic Foot Exam - Simple   Simple Foot Form  Diabetic Foot exam was performed with the following findings:  Yes 03/14/2014  9:32 AM  Visual Inspection  No deformities, no ulcerations, no other skin breakdown bilaterally:  Yes  Sensation Testing  Intact to touch and monofilament testing bilaterally:  Yes  Pulse Check  Posterior Tibialis and Dorsalis pulse intact bilaterally:  Yes  Comments      Results for orders placed or performed in visit on 03/14/14  POCT glucose (manual entry)  Result Value Ref Range   POC Glucose 124 (A) 70 - 99 mg/dl  POCT glycosylated hemoglobin (Hb A1C)  Result Value Ref Range   Hemoglobin A1C 6.2        Assessment & Plan:  1. Type 2 diabetes mellitus with diabetic chronic kidney disease Controlled. Continue current treatment and efforts for weight loss. - Microalbumin, urine - POCT  glucose (manual entry) - POCT glycosylated hemoglobin (Hb A1C)  2. Persistent microalbuminuria associated with type II diabetes mellitus See above.  3. Essential hypertension Controlled. Continue current treatment and healthy lifestyle changes. - COMPLETE METABOLIC PANEL WITH GFR  4. Hyperlipidemia Await lab results. Healthy lifestyle changes. Goal LDL <70.  5. Obesity (BMI 30-39.9) Healthy eating choices and increased cardiovascular exercise.  6. Asthma, unspecified asthma severity, uncomplicated 7. Asthma with acute exacerbation, unspecified asthma severity Restart Qvar. Rest, fluids. Repeat spirometry at next visit, when well. - Pulmonary Function Test - beclomethasone (QVAR) 80 MCG/ACT inhaler; Inhale 2 puffs into the lungs 2 (two) times daily.  Dispense: 3 Inhaler; Refill: 3 - albuterol (PROVENTIL HFA;VENTOLIN HFA) 108 (90 BASE) MCG/ACT inhaler; Inhale 2 puffs into the lungs every 6 (six) hours as needed.  Dispense: 1 Inhaler; Refill: 1  8. Viral URI with cough Supportive care.  Anticipatory guidance.  RTC if symptoms worsen/persist.  Return in about 3 months (around 06/12/2014) for re-evaluation of diabetes and  breathing.  Fara Chute, PA-C Physician Assistant-Certified [facility]

## 2014-03-14 NOTE — Patient Instructions (Signed)
Exercise. Make your health a higher priority. You CAN do it!

## 2014-03-15 LAB — MICROALBUMIN, URINE: Microalb, Ur: 4.6 mg/dL — ABNORMAL HIGH (ref <2.0)

## 2014-03-21 ENCOUNTER — Ambulatory Visit: Payer: BC Managed Care – PPO | Admitting: Physician Assistant

## 2014-05-22 ENCOUNTER — Ambulatory Visit (INDEPENDENT_AMBULATORY_CARE_PROVIDER_SITE_OTHER): Payer: BC Managed Care – PPO | Admitting: Physician Assistant

## 2014-05-22 VITALS — BP 128/70 | HR 58 | Temp 98.4°F | Resp 18 | Ht 62.25 in | Wt 214.8 lb

## 2014-05-22 DIAGNOSIS — H66002 Acute suppurative otitis media without spontaneous rupture of ear drum, left ear: Secondary | ICD-10-CM

## 2014-05-22 DIAGNOSIS — H6122 Impacted cerumen, left ear: Secondary | ICD-10-CM | POA: Diagnosis not present

## 2014-05-22 DIAGNOSIS — L309 Dermatitis, unspecified: Secondary | ICD-10-CM

## 2014-05-22 MED ORDER — AMOXICILLIN 875 MG PO TABS: 875.0000 mg | ORAL_TABLET | Freq: Two times a day (BID) | ORAL | Status: AC

## 2014-05-22 NOTE — Progress Notes (Signed)
Patient ID: Tracy Kennedy, female    DOB: Jun 19, 1950, 64 y.o.   MRN: [id number]  PCP: Wynne Dust  Subjective:   Chief Complaint  Patient presents with  . Otalgia    left x 1 week    HPI  Tracy Kennedy presents with ear discomfort x 1 week. Symptoms began after a trip to the mountains. She often develops ear pressure after traveling in the mountains, but symptoms usually self resolve in a couple of days.  No significant nasal congestion or drainage, nausea or dizziness. Reduced hearing on the LEFT. The ear feels different with her head in different positions.  She also has an itchy patch of skin on the LEFT posterior scalp. No pain. No drainage.  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 Her home BP log shows significant improvement in BP readings from that date forward.  Review of Systems Review of Systems As above. No fever, chills.    Patient Active Problem List   Diagnosis Date Noted  . Obesity (BMI 30-39.9) 10/30/2013  . 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 DM (diabetes mellitus), type 2 with renal complications   . AORTIC VALVE DISORDERS 10/26/2008  . Hyperlipidemia 10/25/2008  . Essential hypertension 10/25/2008  . Asthma 10/25/2008  . GERD 10/25/2008  . MURMUR 10/25/2008  . CHEST PAIN 10/25/2008     Prior to Admission medications   Medication Sig Start Date End Date Taking? Authorizing Provider  ACCU-CHEK AVIVA PLUS test strip TEST BLOOD SUGARS ONE TIME DAILY AS DIRECTED 11/03/13  Yes Mancel Bale, PA-C  albuterol (PROVENTIL HFA;VENTOLIN HFA) 108 (90 BASE) MCG/ACT inhaler Inhale 2 puffs into the lungs every 6 (six) hours as needed. 03/14/14  Yes Chelle S Jeffery, PA-C  aspirin 81 MG tablet Take 81 mg by mouth daily.   Yes Historical Provider, MD  atenolol-chlorthalidone (TENORETIC) 50-25 MG per tablet TAKE 1 TABLET BY MOUTH DAILY 06/09/13  Yes Chelle S Jeffery, PA-C    BIOTIN PO Take by mouth daily.   Yes Historical Provider, MD  Blood Glucose Monitoring Suppl (BLOOD GLUCOSE MONITOR KIT) KIT Use to test blood sugar daily. 10/11/12  Yes Chelle S Jeffery, PA-C  CALCIUM PO Take by mouth.   Yes Historical Provider, MD  CINNAMON PO Take by mouth daily.   Yes Historical Provider, MD  GLUCOSAMINE PO Take by mouth.   Yes Historical Provider, MD  Lancet Devices (LANCING DEVICE) MISC Use to test blood sugar daily. One box 10/20/12  Yes Fara Chute, PA-C  Lancets MISC Use to test blood sugar daily 10/11/12  Yes Chelle S Jeffery, PA-C  lisinopril (PRINIVIL,ZESTRIL) 40 MG tablet TAKE 1 TABLET BY MOUTH DAILY 06/09/13  Yes Chelle S Jeffery, PA-C  lovastatin (MEVACOR) 40 MG tablet Take 1 tablet (40 mg total) by mouth at bedtime. 06/09/13  Yes Chelle S Jeffery, PA-C  omeprazole (PRILOSEC) 20 MG capsule Take 1 capsule (20 mg total) by mouth daily. 06/09/13  Yes Chelle S Jeffery, PA-C  potassium chloride SA (K-DUR,KLOR-CON) 20 MEQ tablet Take 1 tablet (20 mEq total) by mouth daily. 06/09/13  Yes Chelle S Jeffery, PA-C  verapamil (VERELAN PM) 360 MG 24 hr capsule Take 1 capsule (360 mg total) by mouth daily. 06/09/13  Yes Chelle Janalee Dane, PA-C  WELCHOL 625 MG tablet TAKE 3 TABLETS BY MOUTH TWICE DAILY WITH A MEAL 12/29/13  Yes Chelle S Jeffery, PA-C  beclomethasone (QVAR) 80 MCG/ACT inhaler  Inhale 2 puffs into the lungs 2 (two) times daily. Patient not taking: Reported on 05/22/2014 03/14/14   Reyanna Baley S Wilmar Prabhakar, PA-C  ipratropium (ATROVENT) 0.03 % nasal spray Place 2 sprays into both nostrils 2 (two) times daily. Patient not taking: Reported on 05/22/2014 10/30/13   Fara Chute, PA-C     Allergies  Allergen Reactions  . Codeine     REACTION: nausea       Objective:  Physical Exam  Physical Exam  Constitutional: She is oriented to person, place, and time. She appears well-developed and well-nourished. She is active and cooperative. No distress.  BP 128/70 mmHg  Pulse 58   Temp(Src) 98.4 F (36.9 C) (Oral)  Resp 18  Ht 5' 2.25" (1.581 m)  Wt 214 lb 12.8 oz (97.433 kg)  BMI 38.98 kg/m2  SpO2 96%  LMP 01/10/2000   HENT:  Head: Normocephalic and atraumatic.  Right Ear: Hearing, tympanic membrane, external ear and ear canal normal.  Left Ear: Hearing and external ear normal. No tenderness. A middle ear effusion is present.  Nose: Nose normal.  Mouth/Throat: Uvula is midline and oropharynx is clear and moist.  LEFT canal with cerumen impaction. Removed with irrigation, revealing purulent fluid behind the TM.  Eyes: Conjunctivae, EOM and lids are normal. Pupils are equal, round, and reactive to light. No scleral icterus.  Neck: Normal range of motion and full passive range of motion without pain. Neck supple. No spinous process tenderness and no muscular tenderness present. No thyroid mass and no thyromegaly present.    Pulmonary/Chest: Effort normal.  Neurological: She is alert and oriented to person, place, and time.  Skin: Rash (see above) noted.  Psychiatric: She has a normal mood and affect. Her speech is normal and behavior is normal.           Assessment & Plan:  1. Cerumen impaction, left Resolved with irrigation.  2. Acute suppurative otitis media of left ear without spontaneous rupture of tympanic membrane, recurrence not specified - amoxicillin (AMOXIL) 875 MG tablet; Take 1 tablet (875 mg total) by mouth 2 (two) times daily.  Dispense: 20 tablet; Refill: 0  3. Dermatitis OTC oral antihistamine. OTC steroid cream at HS.   Fara Chute, PA-C Physician Assistant-Certified [facility]

## 2014-05-22 NOTE — Patient Instructions (Signed)
Use the Atrovent nasal spray you have at home, and take an OTC oral antihistamine like Claritin, Zyrtec or Allegra.  You can also use an OTC steroid cream on the rash (use it at bedtime and wash out in the morning-it will make your hair greasy).

## 2014-06-05 ENCOUNTER — Encounter: Payer: Self-pay | Admitting: Physician Assistant

## 2014-06-07 ENCOUNTER — Ambulatory Visit (INDEPENDENT_AMBULATORY_CARE_PROVIDER_SITE_OTHER): Payer: BC Managed Care – PPO | Admitting: Physician Assistant

## 2014-06-07 VITALS — BP 139/80 | HR 54 | Temp 98.2°F | Resp 20 | Ht 62.5 in | Wt 215.4 lb

## 2014-06-07 DIAGNOSIS — J302 Other seasonal allergic rhinitis: Secondary | ICD-10-CM | POA: Diagnosis not present

## 2014-06-07 DIAGNOSIS — H6592 Unspecified nonsuppurative otitis media, left ear: Secondary | ICD-10-CM

## 2014-06-07 MED ORDER — CETIRIZINE HCL 10 MG PO TABS: 10.0000 mg | ORAL_TABLET | Freq: Every day | ORAL | Status: DC

## 2014-06-07 MED ORDER — IPRATROPIUM BROMIDE 0.03 % NA SOLN: 2.0000 | Freq: Two times a day (BID) | NASAL | Status: DC

## 2014-06-07 NOTE — Progress Notes (Signed)
Subjective:    Patient ID: Tracy Kennedy, female    DOB: [birth year], 64 y.o.   MRN: [id number]  HPI Patient presents for follow up for left ear that feels clogged. Was seen 05/22/14 and diagnosed with suppurative otitis media and treated with amoxicillin and Zyrtec. Finished amoxicillin therapy and used zyrtec twice, but discontinued over 1 week ago. Feels that ear has improved as pain has decreased. Left ear is still achy at times. Hearing is decreased and feels like she can feel fluid moving when she moves her head. Now that seasonal allergies have flared has additional sx of congestion, dry cough, sinus pressure, and headache. Denies fever, sore throat, myalgias, rhinorrhea, tinnitus, dizziness, ear drainage, or SOB/CP. Med allergy to codeine.    Review of Systems  Constitutional: Negative for fever and chills.  HENT: Positive for congestion, ear pain (achy) and sinus pressure. Negative for ear discharge, postnasal drip, rhinorrhea, sneezing, sore throat and tinnitus.   Eyes: Negative for itching.  Respiratory: Positive for cough. Negative for shortness of breath.   Cardiovascular: Negative for chest pain.  Neurological: Positive for headaches. Negative for dizziness.  Hematological: Negative for adenopathy.       Objective:   Physical Exam  Constitutional: She appears well-developed and well-nourished. No distress.  Blood pressure 139/80, pulse 54, temperature 98.2 F (36.8 C), temperature source Oral, resp. rate 20, height 5' 2.5" (1.588 m), weight 215 lb 6.4 oz (97.705 kg), last menstrual period 01/10/2000, SpO2 96 %.  HENT:  Head: Normocephalic and atraumatic.  Right Ear: Tympanic membrane, external ear and ear canal normal. No decreased hearing is noted.  Left Ear: External ear and ear canal normal. No drainage, swelling or tenderness. No mastoid tenderness. Tympanic membrane is not injected, not scarred, not perforated, not erythematous, not retracted and not bulging. A middle ear  effusion (serous) is present. No hemotympanum. Decreased hearing is noted.  Nose: Rhinorrhea (without erythema) present. No mucosal edema, nose lacerations or sinus tenderness. Right sinus exhibits no maxillary sinus tenderness and no frontal sinus tenderness. Left sinus exhibits no maxillary sinus tenderness and no frontal sinus tenderness.  Mouth/Throat: Uvula is midline, oropharynx is clear and moist and mucous membranes are normal. No oropharyngeal exudate, posterior oropharyngeal edema, posterior oropharyngeal erythema or tonsillar abscesses.  Eyes: Conjunctivae are normal. Pupils are equal, round, and reactive to light. Right eye exhibits no discharge. Left eye exhibits no discharge. No scleral icterus.  Neck: Normal range of motion. Neck supple. No thyromegaly present.  Cardiovascular: Normal rate, regular rhythm and normal heart sounds.  Exam reveals no gallop and no friction rub.   No murmur heard. Pulmonary/Chest: Effort normal and breath sounds normal. No respiratory distress. She has no wheezes. She has no rales.  Abdominal: Soft. Bowel sounds are normal. She exhibits no distension. There is no tenderness. There is no rebound and no guarding.  Lymphadenopathy:    She has no cervical adenopathy.  Skin: She is not diaphoretic.      Assessment & Plan:  1. Fluid level behind tympanic membrane of left ear 2. Seasonal allergies Will hold off on prednisone taper as patient is diabetic. Should take zyrtec during pollen season.  - cetirizine (ZYRTEC) 10 MG tablet; Take 1 tablet (10 mg total) by mouth daily.  Dispense: 30 tablet; Refill: 11 - ipratropium (ATROVENT) 0.03 % nasal spray; Place 2 sprays into both nostrils 2 (two) times daily.  Dispense: 30 mL; Refill: 0   Haley Roza PA-C  [facility] 06/07/2014 10:31 AM

## 2014-06-13 ENCOUNTER — Encounter: Payer: Self-pay | Admitting: Physician Assistant

## 2014-06-13 ENCOUNTER — Other Ambulatory Visit: Payer: Self-pay | Admitting: Physician Assistant

## 2014-06-13 ENCOUNTER — Ambulatory Visit (INDEPENDENT_AMBULATORY_CARE_PROVIDER_SITE_OTHER): Payer: BC Managed Care – PPO | Admitting: Physician Assistant

## 2014-06-13 VITALS — BP 122/82 | HR 58 | Temp 98.0°F | Resp 16 | Ht 62.0 in | Wt 212.8 lb

## 2014-06-13 DIAGNOSIS — E785 Hyperlipidemia, unspecified: Secondary | ICD-10-CM | POA: Diagnosis not present

## 2014-06-13 DIAGNOSIS — R809 Proteinuria, unspecified: Secondary | ICD-10-CM

## 2014-06-13 DIAGNOSIS — E119 Type 2 diabetes mellitus without complications: Secondary | ICD-10-CM

## 2014-06-13 DIAGNOSIS — E1122 Type 2 diabetes mellitus with diabetic chronic kidney disease: Secondary | ICD-10-CM

## 2014-06-13 DIAGNOSIS — Z114 Encounter for screening for human immunodeficiency virus [HIV]: Secondary | ICD-10-CM | POA: Diagnosis not present

## 2014-06-13 DIAGNOSIS — J45909 Unspecified asthma, uncomplicated: Secondary | ICD-10-CM | POA: Diagnosis not present

## 2014-06-13 DIAGNOSIS — N189 Chronic kidney disease, unspecified: Secondary | ICD-10-CM | POA: Diagnosis not present

## 2014-06-13 DIAGNOSIS — I10 Essential (primary) hypertension: Secondary | ICD-10-CM

## 2014-06-13 DIAGNOSIS — E669 Obesity, unspecified: Secondary | ICD-10-CM | POA: Diagnosis not present

## 2014-06-13 DIAGNOSIS — E1129 Type 2 diabetes mellitus with other diabetic kidney complication: Secondary | ICD-10-CM

## 2014-06-13 LAB — COMPREHENSIVE METABOLIC PANEL
ALT: 22 U/L (ref 0–35)
AST: 18 U/L (ref 0–37)
Albumin: 4.1 g/dL (ref 3.5–5.2)
Alkaline Phosphatase: 64 U/L (ref 39–117)
BUN: 18 mg/dL (ref 6–23)
CALCIUM: 9.6 mg/dL (ref 8.4–10.5)
CHLORIDE: 103 meq/L (ref 96–112)
CO2: 28 mEq/L (ref 19–32)
CREATININE: 0.58 mg/dL (ref 0.50–1.10)
Glucose, Bld: 112 mg/dL — ABNORMAL HIGH (ref 70–99)
Potassium: 3.7 mEq/L (ref 3.5–5.3)
Sodium: 141 mEq/L (ref 135–145)
Total Bilirubin: 0.4 mg/dL (ref 0.2–1.2)
Total Protein: 7.4 g/dL (ref 6.0–8.3)

## 2014-06-13 LAB — LIPID PANEL
Cholesterol: 169 mg/dL (ref 0–200)
HDL: 52 mg/dL (ref >=46)
LDL CALC: 92 mg/dL (ref 0–99)
Total CHOL/HDL Ratio: 3.3 Ratio
Triglycerides: 123 mg/dL (ref <150)
VLDL: 25 mg/dL (ref 0–40)

## 2014-06-13 LAB — CBC WITH DIFFERENTIAL/PLATELET
Basophils Absolute: 0 10*3/uL (ref 0.0–0.1)
Basophils Relative: 0 % (ref 0–1)
EOS ABS: 0.1 10*3/uL (ref 0.0–0.7)
Eosinophils Relative: 2 % (ref 0–5)
HCT: 40.4 % (ref 36.0–46.0)
HEMOGLOBIN: 13.4 g/dL (ref 12.0–15.0)
LYMPHS ABS: 2 10*3/uL (ref 0.7–4.0)
Lymphocytes Relative: 29 % (ref 12–46)
MCH: 29.3 pg (ref 26.0–34.0)
MCHC: 33.2 g/dL (ref 30.0–36.0)
MCV: 88.2 fL (ref 78.0–100.0)
MPV: 8.8 fL (ref 8.6–12.4)
Monocytes Absolute: 0.6 10*3/uL (ref 0.1–1.0)
Monocytes Relative: 9 % (ref 3–12)
NEUTROS ABS: 4.2 10*3/uL (ref 1.7–7.7)
Neutrophils Relative %: 60 % (ref 43–77)
PLATELETS: 278 10*3/uL (ref 150–400)
RBC: 4.58 MIL/uL (ref 3.87–5.11)
RDW: 13.7 % (ref 11.5–15.5)
WBC: 7 10*3/uL (ref 4.0–10.5)

## 2014-06-13 LAB — POCT GLYCOSYLATED HEMOGLOBIN (HGB A1C): HEMOGLOBIN A1C: 6.1

## 2014-06-13 LAB — GLUCOSE, POCT (MANUAL RESULT ENTRY): POC Glucose: 120 mg/dl — AB (ref 70–99)

## 2014-06-13 NOTE — Progress Notes (Signed)
Patient ID: Tracy Kennedy, female    DOB: 1950/05/20, 64 y.o.   MRN: [id number]  PCP: Wynne Dust  Subjective:   Chief Complaint  Patient presents with  . Follow-up    3 mos  . Diabetes    HPI Presents for evaluation of chronic medical problems. 3 month follow-up of diabetes, hypertension, and hyperlipidemia, all of which have been controlled.   She is doing well today and has no major complaints. She has been monitoring her blood sugar and blood pressure at home daily. She records her readings in an app on her phone and brought the readings with her today. Her blood sugar runs between 110-135, with an occasional reading in the 140's. Her blood pressure runs 110-130 mmHg/60-70 mmHg. She has been experiencing some stress with work, and knows when her blood pressure is slightly elevated. 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   Once she retires, she plans on focusing more on eating healthy and exercising. She knows she needs to lose weight, and plans on focusing more on this after retirement.   At her last check-up on 03/14/14, she also had a cough and viral URI. She had not been using her QVAR inhaler, as it causes constriction of her chest and makes it more difficult to breathe. She had increased her albuterol inhaler at the time. Today she no longer has shortness of breath, but has a mild cough related to allergies. Denies chest pain, shortness of breath.   She was seen in the clinic 05/22/14 for cerumen impaction and otitis media. Her ears were irrigated and an antibiotic was prescribed. She was still experiencing ear pain, so she came back on 06/07/14. She still had fluid in her ears at that time and was told to take an oral antihistamine daily. She has been doing this, and on 06/11/14 her ears "popped" and she no longer experiences any ear pain.   Review of Systems  Constitutional: Negative.   HENT: Positive for congestion,  postnasal drip and rhinorrhea. Negative for dental problem, drooling, ear discharge, ear pain, facial swelling, hearing loss, mouth sores, nosebleeds, sinus pressure, sneezing, sore throat, tinnitus, trouble swallowing and voice change.   Eyes: Negative for visual disturbance.  Respiratory: Negative for cough, chest tightness, shortness of breath and wheezing.   Cardiovascular: Negative for chest pain, palpitations and leg swelling.  Gastrointestinal: Negative for nausea, vomiting and diarrhea.  Endocrine: Negative for cold intolerance, heat intolerance, polydipsia, polyphagia and polyuria.  Genitourinary: Negative for dysuria, urgency and frequency.  Skin: Negative for rash.  Neurological: Negative for weakness, light-headedness and headaches.  Psychiatric/Behavioral: Negative for self-injury and dysphoric mood. The patient is not nervous/anxious.        Patient Active Problem List   Diagnosis Date Noted  . Obesity (BMI 30-39.9) 10/30/2013  . 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 DM (diabetes mellitus), type 2 with renal complications   . AORTIC VALVE DISORDERS 10/26/2008  . Hyperlipidemia 10/25/2008  . Essential hypertension 10/25/2008  . Asthma 10/25/2008  . GERD 10/25/2008  . MURMUR 10/25/2008  . CHEST PAIN 10/25/2008     Prior to Admission medications   Medication Sig Start Date End Date Taking? Authorizing Provider  ACCU-CHEK AVIVA PLUS test strip TEST BLOOD SUGARS ONE TIME DAILY AS DIRECTED 11/03/13  Yes Mancel Bale, PA-C  albuterol (PROVENTIL HFA;VENTOLIN HFA) 108 (90 BASE) MCG/ACT inhaler Inhale 2 puffs into the lungs  every 6 (six) hours as needed. 03/14/14  Yes Ferne Ellingwood S Elice Crigger, PA-C  aspirin 81 MG tablet Take 81 mg by mouth daily.   Yes Historical Provider, MD  atenolol-chlorthalidone (TENORETIC) 50-25 MG per tablet TAKE 1 TABLET BY MOUTH DAILY 06/09/13  Yes Shanice Poznanski S Meilech Virts, PA-C  BIOTIN PO Take by mouth daily.   Yes  Historical Provider, MD  Blood Glucose Monitoring Suppl (BLOOD GLUCOSE MONITOR KIT) KIT Use to test blood sugar daily. 10/11/12  Yes Caylei Sperry S Yvone Slape, PA-C  CALCIUM PO Take by mouth.   Yes Historical Provider, MD  cetirizine (ZYRTEC) 10 MG tablet Take 1 tablet (10 mg total) by mouth daily. 06/07/14  Yes Tishira R Brewington, PA-C  CINNAMON PO Take by mouth daily.   Yes Historical Provider, MD  GLUCOSAMINE PO Take by mouth.   Yes Historical Provider, MD  ipratropium (ATROVENT) 0.03 % nasal spray Place 2 sprays into both nostrils 2 (two) times daily. 06/07/14  Yes Tishira R Brewington, PA-C  Lancet Devices (LANCING DEVICE) MISC Use to test blood sugar daily. One box 10/20/12  Yes Fara Chute, PA-C  Lancets MISC Use to test blood sugar daily 10/11/12  Yes Dawanna Grauberger S Aliesha Dolata, PA-C  lisinopril (PRINIVIL,ZESTRIL) 40 MG tablet TAKE 1 TABLET BY MOUTH DAILY 06/09/13  Yes Lazaro Isenhower S Silvia Hightower, PA-C  lovastatin (MEVACOR) 40 MG tablet Take 1 tablet (40 mg total) by mouth at bedtime. 06/09/13  Yes Raymound Katich S Tyniah Kastens, PA-C  omeprazole (PRILOSEC) 20 MG capsule Take 1 capsule (20 mg total) by mouth daily. 06/09/13  Yes Katrinka Herbison S Breannah Kratt, PA-C  potassium chloride SA (K-DUR,KLOR-CON) 20 MEQ tablet Take 1 tablet (20 mEq total) by mouth daily. 06/09/13  Yes Jcion Buddenhagen S Jule Whitsel, PA-C  verapamil (VERELAN PM) 360 MG 24 hr capsule Take 1 capsule (360 mg total) by mouth daily. 06/09/13  Yes Korissa Horsford Janalee Dane, PA-C  WELCHOL 625 MG tablet TAKE 3 TABLETS BY MOUTH TWICE DAILY WITH A MEAL 12/29/13  Yes Awad Gladd Janalee Dane, PA-C     Allergies  Allergen Reactions  . Codeine     REACTION: nausea       Objective:  Physical Exam  Constitutional: She is oriented to person, place, and time. She appears well-developed and well-nourished. She is active and cooperative. No distress.  BP 122/82 mmHg  Pulse 58  Temp(Src) 98 F (36.7 C) (Oral)  Resp 16  Ht 5' 2"  (1.575 m)  Wt 212 lb 12.8 oz (96.525 kg)  BMI 38.91 kg/m2  SpO2 97%  LMP  01/10/2000   HENT:  Head: Normocephalic and atraumatic.  Right Ear: External ear normal.  Left Ear: External ear normal.  Nose: Nose normal.  Mouth/Throat: Oropharynx is clear and moist.  Eyes: Conjunctivae, EOM and lids are normal. Pupils are equal, round, and reactive to light. No scleral icterus.  Neck: No thyromegaly present.  Cardiovascular: Normal rate, regular rhythm, normal heart sounds and intact distal pulses.   Pulmonary/Chest: Effort normal and breath sounds normal.  Lymphadenopathy:    She has no cervical adenopathy.  Neurological: She is alert and oriented to person, place, and time.  Skin: Skin is warm, dry and intact.  Psychiatric: She has a normal mood and affect. Her speech is normal and behavior is normal.    Diabetic Foot Exam - Simple   Simple Foot Form  Diabetic Foot exam was performed with the following findings:  Yes 06/13/2014  8:15 AM  Visual Inspection  No deformities, no ulcerations, no other skin breakdown bilaterally:  Yes  Sensation Testing  Intact to touch and monofilament testing bilaterally:  Yes  Pulse Check  Posterior Tibialis and Dorsalis pulse intact bilaterally:  Yes  Comments  Pedal pulses felt in both feet on exam            Assessment & Plan:   1. Type 2 diabetes mellitus with diabetic chronic kidney disease 2. 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 Has been controlled. - POCT glucose (manual entry) - POCT glycosylated hemoglobin (Hb A1C) - Comprehensive metabolic panel - Microalbumin, urine - HM Diabetes Foot Exam   3. Hyperlipidemia Await labs. - Lipid panel  4. Essential hypertension Controlled. Continue current treatment. - CBC with Differential/Platelet  5. Obesity (BMI 30-39.9) Lifestyle changes encouraged. Will be easier in the next 6 weeks when she retires.  6. Screening for HIV (human immunodeficiency virus) - HIV antibody  7. Asthma, unspecified asthma severity,  uncomplicated Controlled. Symptoms only with respiratory illnesses. Will repeat spirometry at her next visit, when she's no longer in her allergy season. She's not tolerated steroid inhaler x 2, though both times she was symptomatic.  Return in about 3 months (around 09/13/2014).   Fara Chute, PA-C Physician Assistant-Certified [facility] .

## 2014-06-13 NOTE — Progress Notes (Signed)
Subjective:    Patient ID: Tracy Kennedy, female    DOB: 21-Dec-1950, 64 y.o.   MRN: [id number]  HPI  Tracy Kennedy is a pleasant 64 year old female who presents today for 3 month follow-up of diabetes, hypertension, and hyperlipidemia.  She is doing well today and has no major complaints. She has been monitoring her blood sugar and blood pressure at home daily. She records her readings in an app on her phone and brought the readings with her today. Her blood sugar runs between 110-135, with an occasional reading in the 140's. Her blood pressure runs 110-130 mmHg/60-70 mmHg. She has been experiencing some stress with work, and knows when her blood pressure is slightly elevated. 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  Once she retires, she plans on focusing more on eating healthy and exercising. She knows she needs to lose weight, and plans on focusing more on this after retirement.   At her last check-up on 03/14/14, she also had a cough and viral URI. She had not been using her QVAR inhaler, as it causes constriction of her chest and makes it more difficult to breathe. She had increased her albuterol inhaler at the time. Today she no longer has shortness of breath, but has a mild cough related to allergies. Denies chest pain, shortness of breath.  She was seen in the clinic 05/22/14 for cerumen impaction and otitis media. Her ears were irrigated and an antibiotic was prescribed. She was still experiencing ear pain, so she came back on 06/07/14. She still had fluid in her ears at that time and was told to take an oral antihistamine daily. She has been doing this, and on 06/11/14 her ears "popped" and she no longer experiences any ear pain.   Review of Systems  Constitutional: Negative for fever, chills, diaphoresis and fatigue.  HENT: Positive for congestion and postnasal drip. Negative for ear pain, hearing loss, rhinorrhea, sinus pressure, sore throat  and tinnitus.   Eyes: Negative for pain and itching.  Respiratory: Positive for cough. Negative for shortness of breath.   Cardiovascular: Negative for chest pain and palpitations.  Gastrointestinal: Negative for nausea, vomiting, diarrhea and constipation.  Genitourinary: Negative for dysuria and frequency.  Musculoskeletal: Negative for myalgias and arthralgias.  Neurological: Negative for dizziness, light-headedness and headaches.       Objective:   Physical Exam  Constitutional: She is oriented to person, place, and time. She appears well-developed and well-nourished. No distress.  BP 122/82 mmHg  Pulse 58  Temp(Src) 98 F (36.7 C) (Oral)  Resp 16  Ht  (1.575 m)  Wt 212 lb 12.8 oz (96.525 kg)  BMI 38.91 kg/m2  SpO2 97%  LMP 01/10/2000  HENT:  Head: Normocephalic and atraumatic.  Right Ear: Hearing, tympanic membrane, external ear and ear canal normal.  Left Ear: Hearing, tympanic membrane, external ear and ear canal normal.  Nose: Nose normal.  Mouth/Throat: Uvula is midline. No oropharyngeal exudate, posterior oropharyngeal edema or posterior oropharyngeal erythema.  Eyes: Conjunctivae are normal. Pupils are equal, round, and reactive to light. No scleral icterus.  Neck: Neck supple.  Cardiovascular: Normal rate and regular rhythm.   Murmur heard.  Systolic murmur is present with a grade of 2/6  Pulses:      Dorsalis pedis pulses are 2+ on the right side, and 2+ on the left side.       Posterior tibial pulses are 2+  on the right side, and 2+ on the left side.  Pulmonary/Chest: Effort normal and breath sounds normal. She has no wheezes. She has no rhonchi. She has no rales.  Lymphadenopathy:       Head (right side): No submental, no submandibular and no tonsillar adenopathy present.       Head (left side): No submental, no submandibular and no tonsillar adenopathy present.    She has no cervical adenopathy.       Right: No supraclavicular adenopathy present.        Left: No supraclavicular adenopathy present.  Neurological: She is alert and oriented to person, place, and time.  Skin: Skin is warm and dry.  Psychiatric: She has a normal mood and affect. Her behavior is normal.      Assessment & Plan:  1. Type 2 diabetes mellitus with diabetic chronic kidney disease Stable. Continue current medication regimen. Continue to monitor blood glucose at home.  - POCT glucose (manual entry) - POCT glycosylated hemoglobin (Hb A1C) - Comprehensive metabolic panel - Microalbumin, urine  - HM Diabetes Foot Exam  2. 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  - Microalbumin, urine   3. Hyperlipidemia Stable. Continue current medication regimen.  - Lipid panel  4. Essential hypertension Stable. Continue current medication regimen. Continue to monitor blood pressure at home.  - CBC with Differential/Platelet  5. Obesity (BMI 30-39.9) Discussed importance of healthy eating and exercising. She plans to focus on this when she retires in a few weeks.   6. Screening for HIV (human immunodeficiency virus) - HIV antibody  7. Asthma, unspecified asthma severity, uncomplicated Improved. Only uses the Albuterol inhaler as needed. Does not use the QVAR inhaler.

## 2014-06-14 LAB — MICROALBUMIN, URINE: Microalb, Ur: 5.4 mg/dL — ABNORMAL HIGH (ref <2.0)

## 2014-06-14 LAB — HIV ANTIBODY (ROUTINE TESTING W REFLEX): HIV 1&2 Ab, 4th Generation: NONREACTIVE

## 2014-06-27 ENCOUNTER — Encounter: Payer: Self-pay | Admitting: Physician Assistant

## 2014-07-06 ENCOUNTER — Other Ambulatory Visit: Payer: Self-pay | Admitting: Physician Assistant

## 2014-07-15 ENCOUNTER — Other Ambulatory Visit: Payer: Self-pay | Admitting: Physician Assistant

## 2014-08-21 ENCOUNTER — Other Ambulatory Visit: Payer: Self-pay | Admitting: Physician Assistant

## 2014-08-23 ENCOUNTER — Other Ambulatory Visit: Payer: Self-pay | Admitting: Physician Assistant

## 2014-08-23 ENCOUNTER — Telehealth: Payer: Self-pay

## 2014-08-23 NOTE — Telephone Encounter (Signed)
PA completed on covermymeds for omeprazole 20 mg QD.

## 2014-08-23 NOTE — Telephone Encounter (Signed)
PA pending

## 2014-08-28 NOTE — Telephone Encounter (Signed)
PA approved through 08/23/15. Notified pharm.

## 2014-09-12 ENCOUNTER — Ambulatory Visit (INDEPENDENT_AMBULATORY_CARE_PROVIDER_SITE_OTHER): Payer: BC Managed Care – PPO | Admitting: Physician Assistant

## 2014-09-12 ENCOUNTER — Encounter: Payer: Self-pay | Admitting: Physician Assistant

## 2014-09-12 VITALS — BP 144/88 | HR 58 | Temp 98.6°F | Resp 16 | Ht 62.0 in | Wt 213.2 lb

## 2014-09-12 DIAGNOSIS — E119 Type 2 diabetes mellitus without complications: Secondary | ICD-10-CM | POA: Diagnosis not present

## 2014-09-12 DIAGNOSIS — E1122 Type 2 diabetes mellitus with diabetic chronic kidney disease: Secondary | ICD-10-CM

## 2014-09-12 DIAGNOSIS — N189 Chronic kidney disease, unspecified: Secondary | ICD-10-CM | POA: Diagnosis not present

## 2014-09-12 DIAGNOSIS — R809 Proteinuria, unspecified: Secondary | ICD-10-CM

## 2014-09-12 DIAGNOSIS — K219 Gastro-esophageal reflux disease without esophagitis: Secondary | ICD-10-CM | POA: Diagnosis not present

## 2014-09-12 DIAGNOSIS — I10 Essential (primary) hypertension: Secondary | ICD-10-CM | POA: Diagnosis not present

## 2014-09-12 DIAGNOSIS — E785 Hyperlipidemia, unspecified: Secondary | ICD-10-CM

## 2014-09-12 DIAGNOSIS — E1129 Type 2 diabetes mellitus with other diabetic kidney complication: Secondary | ICD-10-CM

## 2014-09-12 LAB — COMPREHENSIVE METABOLIC PANEL
ALBUMIN: 4.1 g/dL (ref 3.6–5.1)
ALT: 24 U/L (ref 6–29)
AST: 16 U/L (ref 10–35)
Alkaline Phosphatase: 57 U/L (ref 33–130)
BUN: 17 mg/dL (ref 7–25)
CO2: 27 mmol/L (ref 20–31)
CREATININE: 0.58 mg/dL (ref 0.50–0.99)
Calcium: 9.5 mg/dL (ref 8.6–10.4)
Chloride: 102 mmol/L (ref 98–110)
Glucose, Bld: 116 mg/dL — ABNORMAL HIGH (ref 65–99)
POTASSIUM: 3.7 mmol/L (ref 3.5–5.3)
SODIUM: 141 mmol/L (ref 135–146)
TOTAL PROTEIN: 7.1 g/dL (ref 6.1–8.1)
Total Bilirubin: 0.3 mg/dL (ref 0.2–1.2)

## 2014-09-12 LAB — POCT GLYCOSYLATED HEMOGLOBIN (HGB A1C): HEMOGLOBIN A1C: 5.9

## 2014-09-12 LAB — LIPID PANEL
CHOL/HDL RATIO: 2.9 ratio (ref <=5.0)
CHOLESTEROL: 154 mg/dL (ref 125–200)
HDL: 54 mg/dL (ref >=46)
LDL Cholesterol: 77 mg/dL (ref <130)
Triglycerides: 113 mg/dL (ref <150)
VLDL: 23 mg/dL (ref <30)

## 2014-09-12 LAB — GLUCOSE, POCT (MANUAL RESULT ENTRY): POC Glucose: 121 mg/dl — AB (ref 70–99)

## 2014-09-12 LAB — MICROALBUMIN, URINE: MICROALB UR: 6.6 mg/dL — AB (ref <2.0)

## 2014-09-12 MED ORDER — POTASSIUM CHLORIDE CRYS ER 20 MEQ PO TBCR: EXTENDED_RELEASE_TABLET | ORAL | Status: DC

## 2014-09-12 MED ORDER — VERAPAMIL HCL ER 360 MG PO CP24: 360.0000 mg | ORAL_CAPSULE | Freq: Every day | ORAL | Status: DC

## 2014-09-12 MED ORDER — LISINOPRIL 40 MG PO TABS: ORAL_TABLET | ORAL | Status: DC

## 2014-09-12 MED ORDER — OMEPRAZOLE 20 MG PO CPDR: 20.0000 mg | DELAYED_RELEASE_CAPSULE | Freq: Every day | ORAL | Status: DC

## 2014-09-12 MED ORDER — LOVASTATIN 40 MG PO TABS: 40.0000 mg | ORAL_TABLET | Freq: Every day | ORAL | Status: DC

## 2014-09-12 NOTE — Patient Instructions (Signed)
I will contact you with your lab results as soon as they are available.   If you have not heard from me in 2 weeks, please contact me.  The fastest way to get your results is to register for My Chart (see the instructions on the last page of this printout).   

## 2014-09-12 NOTE — Progress Notes (Signed)
Patient ID: Tracy Kennedy, female    DOB: 04/04/1950, 64 y.o.   MRN: [id number]  PCP: Wynne Dust  Subjective:   Chief Complaint  Patient presents with  . Diabetes  . Hypertension  . Hyperlipidemia    HPI Presents for evaluation of HTN, HL, and DM2.   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 She states that her BP is elevated today, possibly due to stress as she waits for retirement money to come in, however since retirement she experiences less stress/anxiety. Her father is 53 y/o and she is grateful to spend more time with him. Pt states she will be starting [place] [employer] next week and "really get serious about losing some weight." Pt reports checking her sugars and BP regularly, almost every day. Pt states her sugars have been averaging in the "one-teens". She has no major concerns today.    Review of Systems Constitutional: Negative.  HENT: Negative.  Eyes: Negative.  Respiratory: Negative.  Cardiovascular: Negative.  Gastrointestinal: Negative.  Endocrine: Negative.  Genitourinary: Negative.  Skin: Negative.  Neurological: Negative.      Patient Active Problem List   Diagnosis Date Noted  . Obesity (BMI 30-39.9) 10/30/2013  . 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 DM (diabetes mellitus), type 2 with renal complications   . AORTIC VALVE DISORDERS 10/26/2008  . Hyperlipidemia 10/25/2008  . Essential hypertension 10/25/2008  . Asthma 10/25/2008  . GERD 10/25/2008  . MURMUR 10/25/2008  . CHEST PAIN 10/25/2008     Prior to Admission medications   Medication Sig Start Date End Date Taking? Authorizing Provider  ACCU-CHEK AVIVA PLUS test strip TEST BLOOD SUGARS ONE TIME DAILY AS DIRECTED 11/03/13  Yes Mancel Bale, PA-C  albuterol (PROVENTIL HFA;VENTOLIN HFA) 108 (90 BASE) MCG/ACT inhaler Inhale 2 puffs into the lungs every 6 (six) hours as needed. 03/14/14  Yes Velencia Lenart, PA-C   aspirin 81 MG tablet Take 81 mg by mouth daily.   Yes Historical Provider, MD  atenolol-chlorthalidone (TENORETIC) 50-25 MG per tablet TAKE 1 TABLET BY MOUTH DAILY. 08/23/14  Yes Jaynee Eagles, PA-C  BIOTIN PO Take by mouth daily.   Yes Historical Provider, MD  Blood Glucose Monitoring Suppl (BLOOD GLUCOSE MONITOR KIT) KIT Use to test blood sugar daily. 10/11/12  Yes Baya Lentz, PA-C  CALCIUM PO Take by mouth.   Yes Historical Provider, MD  cetirizine (ZYRTEC) 10 MG tablet Take 1 tablet (10 mg total) by mouth daily. 06/07/14  Yes Tishira R Brewington, PA-C  CINNAMON PO Take by mouth daily.   Yes Historical Provider, MD  GLUCOSAMINE PO Take by mouth.   Yes Historical Provider, MD  ipratropium (ATROVENT) 0.03 % nasal spray Place 2 sprays into both nostrils 2 (two) times daily. 06/07/14  Yes Tishira R Brewington, PA-C  Lancet Devices (LANCING DEVICE) MISC Use to test blood sugar daily. One box 10/20/12  Yes [name], PA-C  Lancets MISC Use to test blood sugar daily 10/11/12  Yes Brayli Klingbeil, PA-C  lisinopril (PRINIVIL,ZESTRIL) 40 MG tablet TAKE 1 TABLET BY MOUTH DAILY 09/12/14  Yes Vidya Bamford, PA-C  lovastatin (MEVACOR) 40 MG tablet Take 1 tablet (40 mg total) by mouth at bedtime. 09/12/14  Yes Ricki Clack, PA-C  omeprazole (PRILOSEC) 20 MG capsule Take 1 capsule (20 mg total) by mouth daily. 09/12/14  Yes Sherley Mckenney, PA-C  potassium chloride SA (K-DUR,KLOR-CON) 20 MEQ tablet TAKE 1 TABLET BY MOUTH DAILY. 09/12/14  Yes Mohan Erven, PA-C  verapamil (VERELAN PM) 360 MG 24 hr capsule Take 1 capsule (360 mg total) by mouth daily. 09/12/14  Yes Mali Eppard, PA-C  WELCHOL 625 MG tablet TAKE 3 TABLETS BY MOUTH TWICE DAILY WITH A MEAL 12/29/13  Yes Onetha Gaffey, PA-C     Allergies  Allergen Reactions  . Codeine     REACTION: nausea       Objective:  Physical Exam  Constitutional: She is oriented to person, place, and time. She appears well-developed and well-nourished. No distress.    BP 144/88 mmHg  Pulse 58  Temp(Src) 98.6 F (37 C) (Oral)  Resp 16  Ht _0  (1.575 m)  Wt 213 lb 3.2 oz (96.707 kg)  BMI 38.98 kg/m2  SpO2 97%  LMP 01/10/2000   Eyes: Conjunctivae are normal. No scleral icterus.  Neck: No thyromegaly present.  Cardiovascular: Normal rate, regular rhythm, normal heart sounds and intact distal pulses.   Pulmonary/Chest: Effort normal and breath sounds normal.  Lymphadenopathy:    She has no cervical adenopathy.  Neurological: She is alert and oriented to person, place, and time.  Skin: Skin is warm and dry.  Psychiatric: She has a normal mood and affect. Her behavior is normal.       Results for orders placed or performed in visit on 09/12/14  POCT glucose (manual entry)  Result Value Ref Range   POC Glucose 121 (A) 70 - 99 mg/dl  POCT glycosylated hemoglobin (Hb A1C)  Result Value Ref Range   Hemoglobin A1C 5.9        Assessment & Plan:   1. Type 2 diabetes mellitus with diabetic chronic kidney disease Well controlled. Continue current efforts for healthy lifestyle changes. - POCT glucose (manual entry) - POCT glycosylated hemoglobin (Hb A1C) - Comprehensive metabolic panel - lisinopril (PRINIVIL,ZESTRIL) 40 MG tablet; TAKE 1 TABLET BY MOUTH DAILY  Dispense: 90 tablet; Refill: 3  2. Persistent microalbuminuria associated with type II diabetes mellitus Continue efforts to control HTN. - Microalbumin, urine  3. Hyperlipidemia If LDL <70, consider d/c Welchol. - Lipid panel - lovastatin (MEVACOR) 40 MG tablet; Take 1 tablet (40 mg total) by mouth at bedtime.  Dispense: 90 tablet; Refill: 3  4. Essential hypertension Above goal today. Continue lifestyle changes. Add a diuretic if remains elevated at next visit. - potassium chloride SA (K-DUR,KLOR-CON) 20 MEQ tablet; TAKE 1 TABLET BY MOUTH DAILY.  Dispense: 90 tablet; Refill: 3 - verapamil (VERELAN PM) 360 MG 24 hr capsule; Take 1 capsule (360 mg total) by mouth daily.  Dispense:  90 capsule; Refill: 3  5. Gastroesophageal reflux disease without esophagitis Controlled on current treatment. - omeprazole (PRILOSEC) 20 MG capsule; Take 1 capsule (20 mg total) by mouth daily.  Dispense: 90 capsule; Refill: 3  Return in about 3 months (around 12/13/2014).   Fara Chute, PA-C Physician Assistant-Certified [facility]

## 2014-09-12 NOTE — Progress Notes (Signed)
   Subjective:    Patient ID: Tracy Kennedy, female    DOB: 20-Jun-1950, 64 y.o.   MRN: [id number]  HPI Pt presents today for her 3 month FU of HTN, HL, and DM2. She is recently retired from being a Research scientist (medical). She states that her BP is elevated today, possibly due to stress as she waits for retirement money to come in, however since retirement she experiences less stress/anxiety. Her father is 105 y/o and she is grateful to spend more time with him. Pt states she will be starting [place] chi next week and "really get serious about losing some weight." Pt reports checking her sugars and BP regularly, almost every day. Pt states her sugars have been averaging in the "one-teens". She has no major concerns today.  Review of Systems  Constitutional: Negative.   HENT: Negative.   Eyes: Negative.   Respiratory: Negative.   Cardiovascular: Negative.   Gastrointestinal: Negative.   Endocrine: Negative.   Genitourinary: Negative.   Skin: Negative.   Neurological: Negative.      Objective:   Physical Exam  Constitutional: She is oriented to person, place, and time. She appears well-developed and well-nourished. No distress.  HENT:  Head: Normocephalic and atraumatic.  Neck: Normal range of motion. Neck supple. No tracheal deviation present. No thyromegaly present.  Cardiovascular: Normal rate, regular rhythm, normal heart sounds and intact distal pulses.  Exam reveals no gallop and no friction rub.   No murmur heard. Pulmonary/Chest: Effort normal and breath sounds normal. No respiratory distress. She has no wheezes. She has no rales. She exhibits no tenderness.  Musculoskeletal: She exhibits no edema.  Lymphadenopathy:    She has no cervical adenopathy.  Neurological: She is alert and oriented to person, place, and time.  Skin: Skin is warm and dry. No rash noted. She is not diaphoretic. No erythema. No pallor.  Psychiatric: She has a normal mood and affect. Her behavior is normal. Judgment  and thought content normal.   Results for orders placed or performed in visit on 09/12/14  POCT glucose (manual entry)  Result Value Ref Range   POC Glucose 121 (A) 70 - 99 mg/dl  POCT glycosylated hemoglobin (Hb A1C)  Result Value Ref Range   Hemoglobin A1C 5.9        Assessment & Plan:  1. Type 2 diabetes mellitus with diabetic chronic kidney disease - POCT glucose (manual entry) - POCT glycosylated hemoglobin (Hb A1C) - Comprehensive metabolic panel - lisinopril (PRINIVIL,ZESTRIL) 40 MG tablet; TAKE 1 TABLET BY MOUTH DAILY  Dispense: 90 tablet; Refill: 3  2. Persistent microalbuminuria associated with type II diabetes mellitus - Microalbumin, urine  3. Hyperlipidemia - Lipid panel - lovastatin (MEVACOR) 40 MG tablet; Take 1 tablet (40 mg total) by mouth at bedtime.  Dispense: 90 tablet; Refill: 3  4. Essential hypertension - potassium chloride SA (K-DUR,KLOR-CON) 20 MEQ tablet; TAKE 1 TABLET BY MOUTH DAILY.  Dispense: 90 tablet; Refill: 3 - verapamil (VERELAN PM) 360 MG 24 hr capsule; Take 1 capsule (360 mg total) by mouth daily.  Dispense: 90 capsule; Refill: 3  5. Gastroesophageal reflux disease without esophagitis - omeprazole (PRILOSEC) 20 MG capsule; Take 1 capsule (20 mg total) by mouth daily.  Dispense: 90 capsule; Refill: 3

## 2014-09-13 ENCOUNTER — Encounter: Payer: Self-pay | Admitting: Physician Assistant

## 2014-09-14 ENCOUNTER — Encounter: Payer: Self-pay | Admitting: Gastroenterology

## 2014-11-15 ENCOUNTER — Other Ambulatory Visit: Payer: Self-pay

## 2014-11-15 DIAGNOSIS — Z1231 Encounter for screening mammogram for malignant neoplasm of breast: Secondary | ICD-10-CM

## 2014-11-18 ENCOUNTER — Other Ambulatory Visit: Payer: Self-pay | Admitting: Urgent Care

## 2014-12-06 ENCOUNTER — Ambulatory Visit: Payer: BC Managed Care – PPO

## 2014-12-12 ENCOUNTER — Encounter: Payer: Self-pay | Admitting: Physician Assistant

## 2014-12-12 ENCOUNTER — Ambulatory Visit (INDEPENDENT_AMBULATORY_CARE_PROVIDER_SITE_OTHER): Payer: BC Managed Care – PPO | Admitting: Physician Assistant

## 2014-12-12 VITALS — BP 142/84 | HR 55 | Temp 98.6°F | Resp 16 | Ht 62.0 in | Wt 208.0 lb

## 2014-12-12 DIAGNOSIS — E669 Obesity, unspecified: Secondary | ICD-10-CM

## 2014-12-12 DIAGNOSIS — N183 Chronic kidney disease, stage 3 unspecified: Secondary | ICD-10-CM

## 2014-12-12 DIAGNOSIS — E1122 Type 2 diabetes mellitus with diabetic chronic kidney disease: Secondary | ICD-10-CM

## 2014-12-12 DIAGNOSIS — I10 Essential (primary) hypertension: Secondary | ICD-10-CM

## 2014-12-12 DIAGNOSIS — J45909 Unspecified asthma, uncomplicated: Secondary | ICD-10-CM | POA: Diagnosis not present

## 2014-12-12 DIAGNOSIS — R809 Proteinuria, unspecified: Secondary | ICD-10-CM | POA: Diagnosis not present

## 2014-12-12 DIAGNOSIS — E785 Hyperlipidemia, unspecified: Secondary | ICD-10-CM

## 2014-12-12 DIAGNOSIS — Z23 Encounter for immunization: Secondary | ICD-10-CM | POA: Diagnosis not present

## 2014-12-12 DIAGNOSIS — E1129 Type 2 diabetes mellitus with other diabetic kidney complication: Secondary | ICD-10-CM | POA: Diagnosis not present

## 2014-12-12 LAB — COMPREHENSIVE METABOLIC PANEL
ALBUMIN: 3.9 g/dL (ref 3.6–5.1)
ALK PHOS: 57 U/L (ref 33–130)
ALT: 24 U/L (ref 6–29)
AST: 18 U/L (ref 10–35)
BILIRUBIN TOTAL: 0.4 mg/dL (ref 0.2–1.2)
BUN: 16 mg/dL (ref 7–25)
CHLORIDE: 101 mmol/L (ref 98–110)
CO2: 29 mmol/L (ref 20–31)
CREATININE: 0.64 mg/dL (ref 0.50–0.99)
Calcium: 10.1 mg/dL (ref 8.6–10.4)
Glucose, Bld: 114 mg/dL — ABNORMAL HIGH (ref 65–99)
Potassium: 3.5 mmol/L (ref 3.5–5.3)
SODIUM: 141 mmol/L (ref 135–146)
TOTAL PROTEIN: 6.9 g/dL (ref 6.1–8.1)

## 2014-12-12 LAB — POCT GLYCOSYLATED HEMOGLOBIN (HGB A1C): Hemoglobin A1C: 6

## 2014-12-12 LAB — HEMOGLOBIN A1C: Hgb A1c MFr Bld: 6 % (ref 4.0–6.0)

## 2014-12-12 LAB — LIPID PANEL
CHOLESTEROL: 178 mg/dL (ref 125–200)
HDL: 48 mg/dL (ref >=46)
LDL Cholesterol: 103 mg/dL (ref <130)
TRIGLYCERIDES: 133 mg/dL (ref <150)
Total CHOL/HDL Ratio: 3.7 Ratio (ref <=5.0)
VLDL: 27 mg/dL (ref <30)

## 2014-12-12 LAB — GLUCOSE, POCT (MANUAL RESULT ENTRY): POC Glucose: 128 mg/dl — AB (ref 70–99)

## 2014-12-12 MED ORDER — VERAPAMIL HCL ER 360 MG PO CP24: 360.0000 mg | ORAL_CAPSULE | Freq: Every day | ORAL | Status: DC

## 2014-12-12 MED ORDER — ONETOUCH VERIO W/DEVICE KIT: 1.0000 | PACK | Freq: Every morning | Status: AC

## 2014-12-12 NOTE — Progress Notes (Signed)
Patient ID: Tracy Kennedy, female    DOB: 1951/01/11, 64 y.o.   MRN: [id number]  PCP: Wynne Dust  Subjective:   Chief Complaint  Patient presents with  . Follow-up  . Diabetes    HPI Presents for evaluation of diabetes, HTN, hyperlipidemia.  Has been well controlled, though BP has been above goal recently. We stopped the Welchol at last visit and hope that her.  Working hard at [employer] and is pleased with about 5 lb weight loss. Has a [employer] and tries to go 3x/week.  Frequency of home glucose monitoring: QAM 94-127 Sees a dentist Q6 months, eye specialist annually (visit is scheduled for later this month). Checks feet daily. Is current with influenza vaccine. Is current with pneumococcal vaccine. Mammogram is scheduled.  Her insurance plan will no longer cover the Accucheck machine and testing strips. They will cover the OneTouch Verio, and needs a prescription for that.  Review of Systems  Constitutional: Negative for activity change, appetite change, fatigue and unexpected weight change.  HENT: Negative for congestion, dental problem, ear pain, hearing loss, mouth sores, postnasal drip, rhinorrhea, sneezing, sore throat, tinnitus and trouble swallowing.   Eyes: Negative for photophobia, pain, redness and visual disturbance.  Respiratory: Negative for cough, chest tightness and shortness of breath.   Cardiovascular: Negative for chest pain, palpitations and leg swelling.  Gastrointestinal: Negative for nausea, vomiting, abdominal pain, diarrhea, constipation and blood in stool.  Genitourinary: Negative for dysuria, urgency, frequency and hematuria.  Musculoskeletal: Negative for myalgias, arthralgias, gait problem and neck stiffness.  Skin: Negative for rash.  Neurological: Negative for dizziness, speech difficulty, weakness, light-headedness, numbness and headaches.  Hematological: Negative for adenopathy.  Psychiatric/Behavioral:  Negative for confusion and sleep disturbance. The patient is not nervous/anxious.        Patient Active Problem List   Diagnosis Date Noted  . Obesity (BMI 30-39.9) 10/30/2013  . 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 DM (diabetes mellitus), type 2 with renal complications ([place])   . AORTIC VALVE DISORDERS 10/26/2008  . Hyperlipidemia 10/25/2008  . Essential hypertension 10/25/2008  . Asthma 10/25/2008  . GERD 10/25/2008  . MURMUR 10/25/2008  . CHEST PAIN 10/25/2008     Prior to Admission medications   Medication Sig Start Date End Date Taking? Authorizing Provider  ACCU-CHEK AVIVA PLUS test strip TEST BLOOD SUGARS ONE TIME DAILY AS DIRECTED 11/03/13  Yes Mancel Bale, PA-C  albuterol (PROVENTIL HFA;VENTOLIN HFA) 108 (90 BASE) MCG/ACT inhaler Inhale 2 puffs into the lungs every 6 (six) hours as needed. 03/14/14  Yes Eeva Schlosser, PA-C  aspirin 81 MG tablet Take 81 mg by mouth daily.   Yes Historical Provider, MD  atenolol-chlorthalidone (TENORETIC) 50-25 MG tablet TAKE 1 TABLET BY MOUTH DAILY 11/20/14  Yes Ellanie Oppedisano, PA-C  BIOTIN PO Take by mouth daily.   Yes Historical Provider, MD  Blood Glucose Monitoring Suppl (BLOOD GLUCOSE MONITOR KIT) KIT Use to test blood sugar daily. 10/11/12  Yes Miraya Cudney, PA-C  CALCIUM PO Take by mouth.   Yes Historical Provider, MD  cetirizine (ZYRTEC) 10 MG tablet Take 1 tablet (10 mg total) by mouth daily. 06/07/14  Yes Tishira R Brewington, PA-C  CINNAMON PO Take by mouth daily.   Yes Historical Provider, MD  GLUCOSAMINE PO Take by mouth.   Yes Historical Provider, MD  ipratropium (ATROVENT) 0.03 % nasal spray Place 2 sprays into both nostrils 2 (two) times daily. 06/07/14  Yes  Tishira R Brewington, PA-C  Lancet Devices (LANCING DEVICE) MISC Use to test blood sugar daily. One box 10/20/12  Yes [name], PA-C  Lancets MISC Use to test blood sugar daily 10/11/12  Yes Lainey Nelson, PA-C    lisinopril (PRINIVIL,ZESTRIL) 40 MG tablet TAKE 1 TABLET BY MOUTH DAILY 09/12/14  Yes Johari Pinney, PA-C  lovastatin (MEVACOR) 40 MG tablet Take 1 tablet (40 mg total) by mouth at bedtime. 09/12/14  Yes Otto Caraway, PA-C  omeprazole (PRILOSEC) 20 MG capsule Take 1 capsule (20 mg total) by mouth daily. 09/12/14  Yes Sheilla Maris, PA-C  potassium chloride SA (K-DUR,KLOR-CON) 20 MEQ tablet TAKE 1 TABLET BY MOUTH DAILY. 09/12/14  Yes Evelette Hollern, PA-C  verapamil (VERELAN PM) 360 MG 24 hr capsule Take 1 capsule (360 mg total) by mouth daily. 09/12/14  Yes Jennae Hakeem, PA-C  clobetasol (TEMOVATE) 0.05 % external solution  12/11/14   Historical Provider, MD  WELCHOL 625 MG tablet TAKE 3 TABLETS BY MOUTH TWICE DAILY WITH A MEAL Patient not taking: Reported on 12/12/2014 12/29/13   [name], PA-C     Allergies  Allergen Reactions  . Codeine     REACTION: nausea       Objective:  Physical Exam  Constitutional: She is oriented to person, place, and time. She appears well-developed and well-nourished. She is active and cooperative. No distress.  BP 154/78 mmHg  Pulse 59  Temp(Src) 98.6 F (37 C)  Resp 16  Ht 5' 2"  (1.575 m)  Wt 208 lb (94.348 kg)  BMI 38.03 kg/m2  LMP 01/10/2000   Eyes: Conjunctivae are normal. No scleral icterus.  Neck: No thyromegaly present.  Cardiovascular: Normal rate, regular rhythm, normal heart sounds and intact distal pulses.   Pulmonary/Chest: Effort normal and breath sounds normal.  Lymphadenopathy:    She has no cervical adenopathy.  Neurological: She is alert and oriented to person, place, and time.  Skin: Skin is warm and dry.  Psychiatric: She has a normal mood and affect. Her speech is normal and behavior is normal. Judgment and thought content normal. Cognition and memory are normal.   Results for orders placed or performed in visit on 12/12/14  POCT glucose (manual entry)  Result Value Ref Range   POC Glucose 128 (A) 70 - 99 mg/dl  POCT  glycosylated hemoglobin (Hb A1C)  Result Value Ref Range   Hemoglobin A1C 6.0            Assessment & Plan:   1. Type 2 diabetes mellitus with stage 3 chronic kidney disease, without long-term current use of insulin ([place]) Continue efforts for healthy lifestyle changes. Continue current treatment. - Blood Glucose Monitoring Suppl (ONETOUCH VERIO) W/DEVICE KIT; 1 Device by Does not apply route AC breakfast.  Dispense: 1 kit; Refill: 12 - POCT glucose (manual entry) - POCT glycosylated hemoglobin (Hb A1C) - Comprehensive metabolic panel  2. 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 - Microalbumin, urine  3. Essential hypertension Home readings usually <140/90, so continue current regimen. - verapamil (VERELAN PM) 360 MG 24 hr capsule; Take 1 capsule (360 mg total) by mouth daily.  Dispense: 90 capsule; Refill: 3  4. Obesity (BMI 30-39.9) Continue healthy lifestyle cahnges.  5. Hyperlipidemia Await lab results. Consider restarting Welchol if needed. - Lipid panel  6. Asthma, unspecified asthma severity, uncomplicated Controlled.  7. Need for prophylactic vaccination and inoculation against influenza - Flu Vaccine QUAD 36+ mos IM  Return in about 3 months (around 03/14/2015).   Fara Chute, PA-C Physician Assistant-Certified [facility]

## 2014-12-12 NOTE — Patient Instructions (Signed)
I will contact you with your lab results as soon as they are available.   If you have not heard from me in 2 weeks, please contact me.  The fastest way to get your results is to register for My Chart (see the instructions on the last page of this printout).   

## 2014-12-13 LAB — MICROALBUMIN, URINE: MICROALB UR: 6.5 mg/dL

## 2014-12-14 ENCOUNTER — Other Ambulatory Visit: Payer: Self-pay

## 2014-12-14 MED ORDER — GLUCOSE BLOOD VI STRP: ORAL_STRIP | Status: DC

## 2014-12-15 ENCOUNTER — Encounter: Payer: Self-pay | Admitting: Family Medicine

## 2014-12-15 NOTE — Progress Notes (Signed)
  Medical screening examination/treatment/procedure(s) were performed by non-physician practitioner and as supervising physician I was immediately available for consultation/collaboration.     

## 2015-01-02 ENCOUNTER — Ambulatory Visit
Admission: RE | Admit: 2015-01-02 | Discharge: 2015-01-02 | Disposition: A | Discharge status: Home / Self Care | Payer: BC Managed Care – PPO | Source: Ambulatory Visit

## 2015-01-02 DIAGNOSIS — Z1231 Encounter for screening mammogram for malignant neoplasm of breast: Secondary | ICD-10-CM

## 2015-01-07 ENCOUNTER — Other Ambulatory Visit: Payer: Self-pay | Admitting: Physician Assistant

## 2015-01-10 LAB — HM DIABETES EYE EXAM

## 2015-01-11 ENCOUNTER — Encounter: Payer: Self-pay | Admitting: Family Medicine

## 2015-01-12 ENCOUNTER — Encounter: Payer: Self-pay | Admitting: Physician Assistant

## 2015-02-21 ENCOUNTER — Other Ambulatory Visit: Payer: Self-pay | Admitting: Physician Assistant

## 2015-03-20 ENCOUNTER — Encounter: Payer: Self-pay | Admitting: Physician Assistant

## 2015-03-20 ENCOUNTER — Ambulatory Visit (INDEPENDENT_AMBULATORY_CARE_PROVIDER_SITE_OTHER): Payer: BC Managed Care – PPO | Admitting: Physician Assistant

## 2015-03-20 DIAGNOSIS — E785 Hyperlipidemia, unspecified: Secondary | ICD-10-CM

## 2015-03-20 DIAGNOSIS — E1165 Type 2 diabetes mellitus with hyperglycemia: Secondary | ICD-10-CM

## 2015-03-20 DIAGNOSIS — IMO0002 Reserved for concepts with insufficient information to code with codable children: Secondary | ICD-10-CM

## 2015-03-20 DIAGNOSIS — I10 Essential (primary) hypertension: Secondary | ICD-10-CM

## 2015-03-20 DIAGNOSIS — E1129 Type 2 diabetes mellitus with other diabetic kidney complication: Secondary | ICD-10-CM

## 2015-03-20 LAB — COMPREHENSIVE METABOLIC PANEL
ALBUMIN: 3.8 g/dL (ref 3.6–5.1)
ALK PHOS: 57 U/L (ref 33–130)
ALT: 24 U/L (ref 6–29)
AST: 21 U/L (ref 10–35)
BUN: 16 mg/dL (ref 7–25)
CHLORIDE: 106 mmol/L (ref 98–110)
CO2: 26 mmol/L (ref 20–31)
CREATININE: 0.54 mg/dL (ref 0.50–0.99)
Calcium: 9.2 mg/dL (ref 8.6–10.4)
Glucose, Bld: 110 mg/dL — ABNORMAL HIGH (ref 65–99)
Potassium: 3.5 mmol/L (ref 3.5–5.3)
Sodium: 143 mmol/L (ref 135–146)
TOTAL PROTEIN: 6.6 g/dL (ref 6.1–8.1)
Total Bilirubin: 0.3 mg/dL (ref 0.2–1.2)

## 2015-03-20 LAB — LIPID PANEL
CHOLESTEROL: 165 mg/dL (ref 125–200)
HDL: 51 mg/dL (ref >=46)
LDL Cholesterol: 88 mg/dL (ref <130)
TRIGLYCERIDES: 130 mg/dL (ref <150)
Total CHOL/HDL Ratio: 3.2 Ratio (ref <=5.0)
VLDL: 26 mg/dL (ref <30)

## 2015-03-20 NOTE — Progress Notes (Addendum)
Patient ID: Tracy Kennedy, female    DOB: 1950-04-12, 65 y.o.   MRN: [id number]  PCP: Wynne Dust  Subjective:   Chief Complaint  Patient presents with  . Follow-up  . Hypertension    HPI Presents for evaluation of HTN, DM.  Overall, feels really good. Continues to enjoy retirement and the extra attention she can give her father. Her wife, who has osteoporosis, recently sustained a pelvic fracture.  Making changes in eating. Back to exercising. Wants to get off some of her medications.  Checks glucose QAM before breakfast. 98-137, most 100-120  Home BP<140/90 x 2 weeks. Only 3 readings >140/90 in the past 4 weeks.  Sometimes forgets the second dose of Welchol.  Review of Systems  Constitutional: Negative for activity change, appetite change, fatigue and unexpected weight change.  HENT: Negative for congestion, dental problem, ear pain, hearing loss, mouth sores, postnasal drip, rhinorrhea, sneezing, sore throat, tinnitus and trouble swallowing.   Eyes: Negative for photophobia, pain, redness and visual disturbance.  Respiratory: Negative for cough, chest tightness and shortness of breath.   Cardiovascular: Negative for chest pain, palpitations and leg swelling.  Gastrointestinal: Negative for nausea, vomiting, abdominal pain, diarrhea, constipation and blood in stool.  Genitourinary: Negative for dysuria, urgency, frequency and hematuria.  Musculoskeletal: Positive for arthralgias (wrists, hands, feet, sometimes my knees; SAM-E helps). Negative for myalgias, gait problem and neck stiffness.  Skin: Negative for rash.  Neurological: Negative for dizziness, speech difficulty, weakness, light-headedness, numbness and headaches.  Hematological: Negative for adenopathy.  Psychiatric/Behavioral: Negative for confusion and sleep disturbance. The patient is not nervous/anxious.        Patient Active Problem List   Diagnosis Date Noted  . Obesity (BMI 30-39.9)  10/30/2013  . 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 DM (diabetes mellitus), type 2 with renal complications ([place])   . AORTIC VALVE DISORDERS 10/26/2008  . Hyperlipidemia 10/25/2008  . Essential hypertension 10/25/2008  . Asthma 10/25/2008  . GERD 10/25/2008  . MURMUR 10/25/2008  . CHEST PAIN 10/25/2008     Prior to Admission medications   Medication Sig Start Date End Date Taking? Authorizing Provider  ACCU-CHEK AVIVA PLUS test strip TEST BLOOD SUGARS ONE TIME DAILY AS DIRECTED 11/03/13  Yes Mancel Bale, PA-C  albuterol (PROVENTIL HFA;VENTOLIN HFA) 108 (90 BASE) MCG/ACT inhaler Inhale 2 puffs into the lungs every 6 (six) hours as needed. 03/14/14  Yes Lavi Sheehan, PA-C  aspirin 81 MG tablet Take 81 mg by mouth daily.   Yes Historical Provider, MD  atenolol-chlorthalidone (TENORETIC) 50-25 MG tablet TAKE 1 TABLET BY MOUTH DAILY 02/23/15  Yes Sanika Brosious, PA-C  BIOTIN PO Take by mouth daily.   Yes Historical Provider, MD  Blood Glucose Monitoring Suppl (ONETOUCH VERIO) W/DEVICE KIT 1 Device by Does not apply route AC breakfast. 12/12/14  Yes Tyquarius Paglia, PA-C  CALCIUM PO Take by mouth.   Yes Historical Provider, MD  cetirizine (ZYRTEC) 10 MG tablet Take 1 tablet (10 mg total) by mouth daily. 06/07/14  Yes Tishira R Brewington, PA-C  clobetasol (TEMOVATE) 0.05 % external solution  12/11/14  Yes Historical Provider, MD  colesevelam (WELCHOL) 625 MG tablet Take 3 tablets by mouth twice daily with a meal 01/08/15  Yes Jimmie Rueter, PA-C  GLUCOSAMINE PO Take by mouth.   Yes Historical Provider, MD  glucose blood test strip Use as instructed 12/14/14  Yes Jontrell Bushong, PA-C  ipratropium (ATROVENT) 0.03 % nasal spray Place 2  sprays into both nostrils 2 (two) times daily. 06/07/14  Yes Tishira R Brewington, PA-C  Lancet Devices (LANCING DEVICE) MISC Use to test blood sugar daily. One box 10/20/12  Yes [name], PA-C  Lancets MISC Use  to test blood sugar daily 10/11/12  Yes Lenzie Sandler, PA-C  lisinopril (PRINIVIL,ZESTRIL) 40 MG tablet TAKE 1 TABLET BY MOUTH DAILY 09/12/14  Yes Malaysha Arlen, PA-C  lovastatin (MEVACOR) 40 MG tablet Take 1 tablet (40 mg total) by mouth at bedtime. 09/12/14  Yes Riley Papin, PA-C  omeprazole (PRILOSEC) 20 MG capsule Take 1 capsule (20 mg total) by mouth daily. 09/12/14  Yes Lynesha Bango, PA-C  potassium chloride SA (K-DUR,KLOR-CON) 20 MEQ tablet TAKE 1 TABLET BY MOUTH DAILY. 09/12/14  Yes Amor Hyle, PA-C  verapamil (VERELAN PM) 360 MG 24 hr capsule Take 1 capsule (360 mg total) by mouth daily. 12/12/14  Yes Cortlynn Hollinsworth, PA-C  CINNAMON PO Take by mouth daily. Reported on 03/20/2015    Historical Provider, MD     Allergies  Allergen Reactions  . Codeine     REACTION: nausea       Objective:  Physical Exam  Constitutional: She is oriented to person, place, and time. She appears well-developed and well-nourished. She is active and cooperative. No distress.  BP 150/84 mmHg  Pulse 59  Temp(Src) 98.5 F (36.9 C) (Oral)  Resp 16  Ht _0  (1.549 m)  Wt 209 lb 12.8 oz (95.165 kg)  BMI 39.66 kg/m2  SpO2 96%  LMP 01/10/2000   HENT:  Head: Normocephalic and atraumatic.  Eyes: Conjunctivae are normal. No scleral icterus.  Neck: Full passive range of motion without pain and phonation normal. Neck supple. No spinous process tenderness and no muscular tenderness present. No thyromegaly present.  Cardiovascular: Normal rate, regular rhythm, normal heart sounds and intact distal pulses.   Pulmonary/Chest: Effort normal and breath sounds normal.  Lymphadenopathy:    She has no cervical adenopathy.  Neurological: She is alert and oriented to person, place, and time.  Skin: Skin is warm and dry.  Psychiatric: She has a normal mood and affect. Her behavior is normal.           Assessment & Plan:   1. Type 2 diabetes mellitus with persistent microalbuminuria, without long-term current  use of insulin ([facility]) Encouraged improved eating habits and increased exercise. - HM Diabetes Foot Exam - Hemoglobin A1c - Comprehensive metabolic panel  2. Hyperlipidemia Await lab results. Consider inconsistent Welchol if LDL is above goal. - Comprehensive metabolic panel - Lipid panel  3. Essential hypertension Above goal here, but most readings at home are <140/90. Continue current regimen. Anticipate lifestyle changes will help prevent need for additional treatment. - Comprehensive metabolic panel   Return in about 3 months (around 06/17/2015).   Fara Chute, PA-C Physician Assistant-Certified [facility]

## 2015-03-20 NOTE — Patient Instructions (Signed)
Keep up the GREAT WORK!

## 2015-03-21 LAB — HEMOGLOBIN A1C
Hgb A1c MFr Bld: 6.3 % — ABNORMAL HIGH (ref <5.7)
Mean Plasma Glucose: 134 mg/dL — ABNORMAL HIGH (ref <117)

## 2015-04-21 ENCOUNTER — Encounter: Payer: Self-pay | Admitting: Physician Assistant

## 2015-04-25 ENCOUNTER — Encounter: Payer: Self-pay | Admitting: Physician Assistant

## 2015-04-26 NOTE — Addendum Note (Signed)
Addended by: Morrell RiddleWEBER, Fredderick Swanger L on: 04/26/2015 08:48 PM   Modules accepted: Kipp BroodSmartSet

## 2015-05-06 ENCOUNTER — Other Ambulatory Visit: Payer: Self-pay | Admitting: Physician Assistant

## 2015-05-10 NOTE — Addendum Note (Signed)
Addended by: Fernande BrasJEFFERY, Annmarie Plemmons S on: 05/10/2015 03:47 PM   Modules accepted: SmartSet

## 2015-05-10 NOTE — Addendum Note (Signed)
Addended by: Fernande BrasJEFFERY, Danish Ruffins S on: 05/10/2015 03:34 PM   Modules accepted: SmartSet

## 2015-06-13 ENCOUNTER — Other Ambulatory Visit: Payer: Self-pay

## 2015-06-13 DIAGNOSIS — J302 Other seasonal allergic rhinitis: Secondary | ICD-10-CM

## 2015-06-13 DIAGNOSIS — H6592 Unspecified nonsuppurative otitis media, left ear: Secondary | ICD-10-CM

## 2015-06-13 MED ORDER — CETIRIZINE HCL 10 MG PO TABS: 10.0000 mg | ORAL_TABLET | Freq: Every day | ORAL | Status: DC

## 2015-06-19 ENCOUNTER — Encounter: Payer: Self-pay | Admitting: Physician Assistant

## 2015-06-19 ENCOUNTER — Ambulatory Visit (INDEPENDENT_AMBULATORY_CARE_PROVIDER_SITE_OTHER): Payer: BC Managed Care – PPO | Admitting: Physician Assistant

## 2015-06-19 VITALS — BP 162/82 | HR 62 | Temp 98.2°F | Resp 16 | Ht 62.0 in | Wt 200.6 lb

## 2015-06-19 DIAGNOSIS — E785 Hyperlipidemia, unspecified: Secondary | ICD-10-CM | POA: Diagnosis not present

## 2015-06-19 DIAGNOSIS — K219 Gastro-esophageal reflux disease without esophagitis: Secondary | ICD-10-CM | POA: Diagnosis not present

## 2015-06-19 DIAGNOSIS — N183 Chronic kidney disease, stage 3 (moderate): Secondary | ICD-10-CM

## 2015-06-19 DIAGNOSIS — E1122 Type 2 diabetes mellitus with diabetic chronic kidney disease: Secondary | ICD-10-CM | POA: Diagnosis not present

## 2015-06-19 DIAGNOSIS — J3089 Other allergic rhinitis: Secondary | ICD-10-CM | POA: Diagnosis not present

## 2015-06-19 DIAGNOSIS — Z130 Encounter for screening for diseases of the blood and blood-forming organs and certain disorders involving the immune mechanism: Secondary | ICD-10-CM

## 2015-06-19 DIAGNOSIS — I10 Essential (primary) hypertension: Secondary | ICD-10-CM

## 2015-06-19 DIAGNOSIS — E1129 Type 2 diabetes mellitus with other diabetic kidney complication: Secondary | ICD-10-CM

## 2015-06-19 DIAGNOSIS — R809 Proteinuria, unspecified: Secondary | ICD-10-CM

## 2015-06-19 DIAGNOSIS — J310 Chronic rhinitis: Secondary | ICD-10-CM | POA: Insufficient documentation

## 2015-06-19 LAB — CBC WITH DIFFERENTIAL/PLATELET
BASOS ABS: 0 {cells}/uL (ref 0–200)
Basophils Relative: 0 %
Eosinophils Absolute: 260 cells/uL (ref 15–500)
Eosinophils Relative: 4 %
HEMATOCRIT: 39.3 % (ref 35.0–45.0)
HEMOGLOBIN: 13.3 g/dL (ref 11.7–15.5)
LYMPHS ABS: 1820 {cells}/uL (ref 850–3900)
LYMPHS PCT: 28 %
MCH: 29.6 pg (ref 27.0–33.0)
MCHC: 33.8 g/dL (ref 32.0–36.0)
MCV: 87.5 fL (ref 80.0–100.0)
MONO ABS: 780 {cells}/uL (ref 200–950)
MPV: 9.2 fL (ref 7.5–12.5)
Monocytes Relative: 12 %
NEUTROS PCT: 56 %
Neutro Abs: 3640 cells/uL (ref 1500–7800)
PLATELETS: 257 10*3/uL (ref 140–400)
RBC: 4.49 MIL/uL (ref 3.80–5.10)
RDW: 13.6 % (ref 11.0–15.0)
WBC: 6.5 10*3/uL (ref 3.8–10.8)

## 2015-06-19 LAB — COMPREHENSIVE METABOLIC PANEL
ALT: 23 U/L (ref 6–29)
AST: 18 U/L (ref 10–35)
Albumin: 4.1 g/dL (ref 3.6–5.1)
Alkaline Phosphatase: 63 U/L (ref 33–130)
BILIRUBIN TOTAL: 0.4 mg/dL (ref 0.2–1.2)
BUN: 16 mg/dL (ref 7–25)
CO2: 29 mmol/L (ref 20–31)
CREATININE: 0.52 mg/dL (ref 0.50–0.99)
Calcium: 9.5 mg/dL (ref 8.6–10.4)
Chloride: 103 mmol/L (ref 98–110)
GLUCOSE: 99 mg/dL (ref 65–99)
Potassium: 3.3 mmol/L — ABNORMAL LOW (ref 3.5–5.3)
SODIUM: 141 mmol/L (ref 135–146)
Total Protein: 7.1 g/dL (ref 6.1–8.1)

## 2015-06-19 LAB — LIPID PANEL
Cholesterol: 157 mg/dL (ref 125–200)
HDL: 47 mg/dL (ref >=46)
LDL CALC: 79 mg/dL (ref <130)
Total CHOL/HDL Ratio: 3.3 Ratio (ref <=5.0)
Triglycerides: 155 mg/dL — ABNORMAL HIGH (ref <150)
VLDL: 31 mg/dL — ABNORMAL HIGH (ref <30)

## 2015-06-19 LAB — MICROALBUMIN, URINE: MICROALB UR: 8.8 mg/dL

## 2015-06-19 LAB — HEMOGLOBIN A1C
HEMOGLOBIN A1C: 5.9 % — AB (ref <5.7)
MEAN PLASMA GLUCOSE: 123 mg/dL

## 2015-06-19 MED ORDER — ATENOLOL-CHLORTHALIDONE 50-25 MG PO TABS: 1.0000 | ORAL_TABLET | Freq: Every day | ORAL | Status: DC

## 2015-06-19 MED ORDER — OMEPRAZOLE 20 MG PO CPDR: 20.0000 mg | DELAYED_RELEASE_CAPSULE | Freq: Every day | ORAL | Status: DC

## 2015-06-19 MED ORDER — IPRATROPIUM BROMIDE 0.03 % NA SOLN: 2.0000 | Freq: Two times a day (BID) | NASAL | Status: DC

## 2015-06-19 NOTE — Progress Notes (Signed)
Patient ID: Tracy Kennedy, female    DOB: [birth year], 65 y.o.   MRN: [id number]  PCP: Wynne Dust  Subjective:   Chief Complaint  Patient presents with  . Follow-up    DIABETES    HPI Presents for evaluation of diabetes, HTN, hyperlipidemia.  No medication problems, though she continues to hope to get off as many as possible.  Her partner has been diagnosed with multiple myeloma. Getting care at the [facility]. Her diagnosis was made following multiple fractures. She has completed half of the planned treatments, and so far is responding well. Will then have a autologous transplant at [facility].  She continues to sleep well, fell good over all. Glucose this morning was 97. Home BP readings are 125-136/64-77  The Obesity Code is helping her with weight loss. Has eliminated caffeine. Added B12 to help boost her energy in the afternoons.  Review of Systems  Constitutional: Negative for activity change, appetite change, fatigue and unexpected weight change.  HENT: Negative for congestion, dental problem, ear pain, hearing loss, mouth sores, postnasal drip, rhinorrhea, sneezing, sore throat, tinnitus and trouble swallowing.   Eyes: Negative for photophobia, pain, redness and visual disturbance.  Respiratory: Negative for cough, chest tightness and shortness of breath.   Cardiovascular: Negative for chest pain, palpitations and leg swelling.  Gastrointestinal: Negative for nausea, vomiting, abdominal pain, diarrhea, constipation and blood in stool.  Genitourinary: Negative for dysuria, urgency, frequency and hematuria.  Musculoskeletal: Negative for myalgias, arthralgias, gait problem and neck stiffness.  Skin: Negative for rash.  Neurological: Negative for dizziness, speech difficulty, weakness, light-headedness, numbness and headaches.  Hematological: Negative for adenopathy.  Psychiatric/Behavioral: Negative for confusion and sleep  disturbance. The patient is not nervous/anxious.        Patient Active Problem List   Diagnosis Date Noted  . Obesity (BMI 30-39.9) 10/30/2013  . 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 DM (diabetes mellitus), type 2 with renal complications ([place])   . AORTIC VALVE DISORDERS 10/26/2008  . Hyperlipidemia 10/25/2008  . Essential hypertension 10/25/2008  . Asthma 10/25/2008  . GERD 10/25/2008  . MURMUR 10/25/2008  . CHEST PAIN 10/25/2008     Prior to Admission medications   Medication Sig Start Date End Date Taking? Authorizing Provider  albuterol (PROVENTIL HFA;VENTOLIN HFA) 108 (90 BASE) MCG/ACT inhaler Inhale 2 puffs into the lungs every 6 (six) hours as needed. 03/14/14  Yes Stefannie Defeo, PA-C  aspirin 81 MG tablet Take 81 mg by mouth daily.   Yes Historical Provider, MD  atenolol-chlorthalidone (TENORETIC) 50-25 MG tablet TAKE 1 TABLET BY MOUTH DAILY 02/23/15  Yes Sakeenah Valcarcel, PA-C  BIOTIN PO Take by mouth daily.   Yes Historical Provider, MD  CALCIUM PO Take by mouth.   Yes Historical Provider, MD  cetirizine (ZYRTEC) 10 MG tablet Take 1 tablet (10 mg total) by mouth daily. 06/13/15  Yes Koran Seabrook, PA-C  CINNAMON PO Take by mouth daily. Reported on 03/20/2015   Yes Historical Provider, MD  clobetasol (TEMOVATE) 0.05 % external solution  12/11/14  Yes Historical Provider, MD  GLUCOSAMINE PO Take by mouth.   Yes Historical Provider, MD  glucose blood test strip Use as instructed 12/14/14  Yes Kathern Lobosco, PA-C  ipratropium (ATROVENT) 0.03 % nasal spray Place 2 sprays into both nostrils 2 (two) times daily. 06/07/14  Yes Tishira R Brewington, PA-C  Lancet Devices (LANCING DEVICE) MISC Use to test blood sugar daily. One  box 10/20/12  Yes Tino Ronan, PA-C  lisinopril (PRINIVIL,ZESTRIL) 40 MG tablet TAKE 1 TABLET BY MOUTH DAILY 09/12/14  Yes Laryah Neuser, PA-C  lovastatin (MEVACOR) 40 MG tablet Take 1 tablet (40 mg total) by  mouth at bedtime. 09/12/14  Yes Jobina Maita, PA-C  omeprazole (PRILOSEC) 20 MG capsule Take 1 capsule (20 mg total) by mouth daily. 09/12/14  Yes Chayne Baumgart, PA-C  potassium chloride SA (K-DUR,KLOR-CON) 20 MEQ tablet TAKE 1 TABLET BY MOUTH DAILY. 09/12/14  Yes Cordae Mccarey, PA-C  verapamil (VERELAN PM) 360 MG 24 hr capsule Take 1 capsule (360 mg total) by mouth daily. 12/12/14  Yes Tamantha Saline, PA-C  WELCHOL 625 MG tablet TAKE 3 TABLETS BY MOUTH TWICE DAILY WITH A MEAL 05/08/15  Yes Jaxsen Bernhart, PA-C  ACCU-CHEK AVIVA PLUS test strip TEST BLOOD SUGARS ONE TIME DAILY AS DIRECTED 11/03/13   Mancel Bale, PA-C  Blood Glucose Monitoring Suppl (ONETOUCH VERIO) W/DEVICE KIT 1 Device by Does not apply route AC breakfast. 12/12/14   [name], PA-C  Lancets MISC Use to test blood sugar daily 10/11/12   [name], PA-C     Allergies  Allergen Reactions  . Codeine     REACTION: nausea       Objective:  Physical Exam  Constitutional: She is oriented to person, place, and time. She appears well-developed and well-nourished. No distress.  BP 162/82 mmHg  Pulse 62  Temp(Src) 98.2 F (36.8 C) (Oral)  Resp 16  Ht 5' 2"  (1.575 m)  Wt 200 lb 9.6 oz (90.992 kg)  BMI 36.68 kg/m2  SpO2 96%  LMP 01/10/2000   Eyes: Conjunctivae are normal. No scleral icterus.  Neck: No thyromegaly present.  Cardiovascular: Normal rate, regular rhythm, normal heart sounds and intact distal pulses.   Pulmonary/Chest: Effort normal and breath sounds normal.  Lymphadenopathy:    She has no cervical adenopathy.  Neurological: She is alert and oriented to person, place, and time.  Skin: Skin is warm and dry.  Psychiatric: She has a normal mood and affect. Her speech is normal and behavior is normal.           Assessment & Plan:   1. Type 2 diabetes mellitus with other diabetic kidney complication, without long-term current use of insulin ([place]) Await results. Continue healthy lifestyle changes, -  Hemoglobin A1c  2. 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 Essential hypertension Above goal here, but controlled at home. Continue current treatment. - atenolol-chlorthalidone (TENORETIC) 50-25 MG tablet; Take 1 tablet by mouth daily.  Dispense: 90 tablet; Refill: 3 - CBC with Differential/Platelet - Comprehensive metabolic panel  4. Hyperlipidemia Await lab results. - Comprehensive metabolic panel - Lipid panel  5. Gastroesophageal reflux disease without esophagitis Stable. - omeprazole (PRILOSEC) 20 MG capsule; Take 1 capsule (20 mg total) by mouth daily.  Dispense: 90 capsule; Refill: 3  6. Other allergic rhinitis Stable. - ipratropium (ATROVENT) 0.03 % nasal spray; Place 2 sprays into both nostrils 2 (two) times daily.  Dispense: 30 mL; Refill: 12  7. Screening for deficiency anemia She requests this due to her current eating plan. - Vitamin B12   Fara Chute, PA-C Physician Assistant-Certified [facility]

## 2015-06-19 NOTE — Patient Instructions (Signed)
     IF you received an x-ray today, you will receive an invoice from [facility]. Please contact [facility] at [phone] with questions or concerns regarding your invoice.   IF you received labwork today, you will receive an invoice from [facility]. Please contact [facility] at [phone] with questions or concerns regarding your invoice.   Our billing staff will not be able to assist you with questions regarding bills from these companies.  You will be contacted with the lab results as soon as they are available. The fastest way to get your results is to activate your My Chart account. Instructions are located on the last page of this paperwork. If you have not heard from us regarding the results in 2 weeks, please contact this office.      

## 2015-06-26 ENCOUNTER — Ambulatory Visit: Payer: BC Managed Care – PPO | Admitting: Physician Assistant

## 2015-09-15 ENCOUNTER — Other Ambulatory Visit: Payer: Self-pay | Admitting: Physician Assistant

## 2015-09-15 DIAGNOSIS — E1122 Type 2 diabetes mellitus with diabetic chronic kidney disease: Secondary | ICD-10-CM

## 2015-09-15 DIAGNOSIS — I10 Essential (primary) hypertension: Secondary | ICD-10-CM

## 2015-10-02 ENCOUNTER — Ambulatory Visit: Payer: BC Managed Care – PPO | Admitting: Physician Assistant

## 2015-10-16 ENCOUNTER — Encounter: Payer: Self-pay | Admitting: Physician Assistant

## 2015-10-16 ENCOUNTER — Ambulatory Visit (INDEPENDENT_AMBULATORY_CARE_PROVIDER_SITE_OTHER): Payer: BC Managed Care – PPO | Admitting: Physician Assistant

## 2015-10-16 VITALS — BP 136/80 | HR 49 | Temp 98.2°F | Resp 18 | Ht 62.0 in | Wt 205.0 lb

## 2015-10-16 DIAGNOSIS — E1129 Type 2 diabetes mellitus with other diabetic kidney complication: Secondary | ICD-10-CM

## 2015-10-16 DIAGNOSIS — I10 Essential (primary) hypertension: Secondary | ICD-10-CM | POA: Diagnosis not present

## 2015-10-16 DIAGNOSIS — R809 Proteinuria, unspecified: Secondary | ICD-10-CM | POA: Diagnosis not present

## 2015-10-16 DIAGNOSIS — R42 Dizziness and giddiness: Secondary | ICD-10-CM | POA: Diagnosis not present

## 2015-10-16 DIAGNOSIS — E785 Hyperlipidemia, unspecified: Secondary | ICD-10-CM

## 2015-10-16 LAB — COMPREHENSIVE METABOLIC PANEL
ALBUMIN: 4.3 g/dL (ref 3.6–5.1)
ALK PHOS: 70 U/L (ref 33–130)
ALT: 30 U/L — AB (ref 6–29)
AST: 24 U/L (ref 10–35)
BUN: 15 mg/dL (ref 7–25)
CALCIUM: 10.1 mg/dL (ref 8.6–10.4)
CHLORIDE: 101 mmol/L (ref 98–110)
CO2: 25 mmol/L (ref 20–31)
Creat: 0.6 mg/dL (ref 0.50–0.99)
Glucose, Bld: 96 mg/dL (ref 65–99)
POTASSIUM: 3.6 mmol/L (ref 3.5–5.3)
Sodium: 140 mmol/L (ref 135–146)
TOTAL PROTEIN: 7.6 g/dL (ref 6.1–8.1)
Total Bilirubin: 0.4 mg/dL (ref 0.2–1.2)

## 2015-10-16 LAB — CBC WITH DIFFERENTIAL/PLATELET
BASOS ABS: 80 {cells}/uL (ref 0–200)
BASOS PCT: 1 %
EOS ABS: 240 {cells}/uL (ref 15–500)
Eosinophils Relative: 3 %
HEMATOCRIT: 41.1 % (ref 35.0–45.0)
Hemoglobin: 13.7 g/dL (ref 11.7–15.5)
Lymphocytes Relative: 35 %
Lymphs Abs: 2800 cells/uL (ref 850–3900)
MCH: 29.1 pg (ref 27.0–33.0)
MCHC: 33.3 g/dL (ref 32.0–36.0)
MCV: 87.4 fL (ref 80.0–100.0)
MONO ABS: 800 {cells}/uL (ref 200–950)
MONOS PCT: 10 %
MPV: 9.4 fL (ref 7.5–12.5)
NEUTROS ABS: 4080 {cells}/uL (ref 1500–7800)
Neutrophils Relative %: 51 %
PLATELETS: 282 10*3/uL (ref 140–400)
RBC: 4.7 MIL/uL (ref 3.80–5.10)
RDW: 13.9 % (ref 11.0–15.0)
WBC: 8 10*3/uL (ref 3.8–10.8)

## 2015-10-16 LAB — LIPID PANEL
CHOL/HDL RATIO: 3.1 ratio (ref <=5.0)
Cholesterol: 196 mg/dL (ref 125–200)
HDL: 64 mg/dL (ref >=46)
LDL CALC: 95 mg/dL (ref <130)
Triglycerides: 185 mg/dL — ABNORMAL HIGH (ref <150)
VLDL: 37 mg/dL — AB (ref <30)

## 2015-10-16 MED ORDER — LOVASTATIN 40 MG PO TABS: 40.0000 mg | ORAL_TABLET | Freq: Every day | ORAL | 3 refills | Status: DC

## 2015-10-16 NOTE — Progress Notes (Signed)
Patient ID: Tracy Kennedy, female    DOB: [birth year], 65 y.o.   MRN: [id number]  PCP: [name], PA-C  Subjective:   Chief Complaint  Patient presents with  . Follow-up    3 month follow up    HPI Presents for evaluation of diabetes, HTN and lipids.  Healthy eating habits slipped a little following her wife's diagnosis of multiple myeloma.  Has been advised against flu vaccine this year because of Teresa's recent transplant. Will ask about it at [facility] follow-up appointment on 9/18.  Follows a low glycemic index eating plan. Is going to start a supplement, GOLO, contains mg, Zn, Chromium, as well as Rhodiola, Inositol, Berberine, Gardenia, Banaba, Salacia, Apple; program comes with an eating plan, recipes, etc as well.  Sometimes just out of the blue, sudden onset of dizziness, lasting 4-5 seconds, then resolves. Isn't sure when it started-several months ago, after Teresa's cancer diagnosis. Doesn't think it's stress related. Last episode was a couple of days ago when she was at her father's (he lives next door, and she is his primary care giver). Wonders if her BP is dropping. Has checked glucose when it happens and it was stable. Sometimes happens once, sometimes 4-5 times in a day. Not triggered by certain foods or eating in general. Usually sitting, either driving or talking. Not triggered by rapid position changes. Thought it was allergies, but she has no symptoms like rhinorrhea, stuffy nose, drainage, sore throat, ear buzzing or popping. Not drinking much water.    Review of Systems  Constitutional: Negative for activity change, appetite change, fatigue and unexpected weight change.  HENT: Negative for congestion, dental problem, ear pain, hearing loss, mouth sores, postnasal drip, rhinorrhea, sneezing, sore throat, tinnitus and trouble swallowing.   Eyes: Negative for photophobia, pain, redness and visual disturbance.  Respiratory: Negative for cough, chest  tightness and shortness of breath.   Cardiovascular: Negative for chest pain, palpitations and leg swelling.  Gastrointestinal: Negative for abdominal pain, blood in stool, constipation, diarrhea, nausea and vomiting.  Genitourinary: Negative for dysuria, frequency, hematuria and urgency.  Musculoskeletal: Negative for arthralgias, gait problem, myalgias and neck stiffness.  Skin: Positive for rash (back of scalp, under treatment by dermatologist).  Neurological: Positive for dizziness. Negative for speech difficulty, weakness, light-headedness, numbness and headaches.  Hematological: Negative for adenopathy.  Psychiatric/Behavioral: Negative for confusion and sleep disturbance. The patient is not nervous/anxious.        Patient Active Problem List   Diagnosis Date Noted  . Rhinitis 06/19/2015  . Obesity (BMI 30-39.9) 10/30/2013  . 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 DM (diabetes mellitus), type 2 with renal complications ([place])   . AORTIC VALVE DISORDERS 10/26/2008  . Hyperlipidemia 10/25/2008  . Essential hypertension 10/25/2008  . Asthma 10/25/2008  . GERD 10/25/2008  . MURMUR 10/25/2008  . CHEST PAIN 10/25/2008     Prior to Admission medications   Medication Sig Start Date End Date Taking? Authorizing Provider  albuterol (PROVENTIL HFA;VENTOLIN HFA) 108 (90 BASE) MCG/ACT inhaler Inhale 2 puffs into the lungs every 6 (six) hours as needed. 03/14/14  Yes Thi Klich, PA-C  aspirin 81 MG tablet Take 81 mg by mouth daily.   Yes Historical Provider, MD  atenolol-chlorthalidone (TENORETIC) 50-25 MG tablet Take 1 tablet by mouth daily. 06/19/15  Yes Dontray Haberland, PA-C  BIOTIN PO Take by mouth daily.   Yes Historical Provider, MD  Blood Glucose Monitoring Suppl (ONETOUCH VERIO) W/DEVICE KIT 1  Device by Does not apply route AC breakfast. 12/12/14  Yes Zamaria Brazzle, PA-C  CALCIUM PO Take by mouth.   Yes Historical Provider, MD    cetirizine (ZYRTEC) 10 MG tablet Take 1 tablet (10 mg total) by mouth daily. 06/13/15  Yes Aurel Nguyen, PA-C  CINNAMON PO Take by mouth daily. Reported on 03/20/2015   Yes Historical Provider, MD  clobetasol (TEMOVATE) 0.05 % external solution  12/11/14  Yes Historical Provider, MD  Cyanocobalamin (VITAMIN B-12 PO) Take by mouth.   Yes Historical Provider, MD  GLUCOSAMINE PO Take by mouth.   Yes Historical Provider, MD  glucose blood test strip Use as instructed 12/14/14  Yes Jatavious Peppard, PA-C  ipratropium (ATROVENT) 0.03 % nasal spray Place 2 sprays into both nostrils 2 (two) times daily. 06/19/15  Yes [name], PA-C  Lancet Devices (LANCING DEVICE) MISC Use to test blood sugar daily. One box 10/20/12  Yes [name], PA-C  Lancets MISC Use to test blood sugar daily 10/11/12  Yes Aamiyah Derrick, PA-C  lisinopril (PRINIVIL,ZESTRIL) 40 MG tablet TAKE 1 TABLET BY MOUTH DAILY 09/16/15  Yes Ashlen Kiger, PA-C  lovastatin (MEVACOR) 40 MG tablet Take 1 tablet (40 mg total) by mouth at bedtime. 09/12/14  Yes Chun Sellen, PA-C  omeprazole (PRILOSEC) 20 MG capsule Take 1 capsule (20 mg total) by mouth daily. 06/19/15  Yes Jalexa Pifer, PA-C  potassium chloride SA (K-DUR,KLOR-CON) 20 MEQ tablet TAKE 1 TABLET BY MOUTH DAILY 09/16/15  Yes Maclain Cohron, PA-C  verapamil (VERELAN PM) 360 MG 24 hr capsule Take 1 capsule (360 mg total) by mouth daily. 12/12/14  Yes Bonifacio Pruden, PA-C  WELCHOL 625 MG tablet TAKE 3 TABLETS BY MOUTH TWICE DAILY WITH A MEAL 05/08/15  Yes Brei Pociask, PA-C  ACCU-CHEK AVIVA PLUS test strip TEST BLOOD SUGARS ONE TIME DAILY AS DIRECTED Patient not taking: Reported on 10/16/2015 11/03/13   Mancel Bale, PA-C     Allergies  Allergen Reactions  . Codeine     REACTION: nausea       Objective:  Physical Exam  Constitutional: She is oriented to person, place, and time. She appears well-developed and well-nourished. She is active and cooperative. No distress.  BP 136/80    Pulse (!) 49   Temp 98.2 F (36.8 C) (Oral)   Resp 18   Ht 5' 2"  (1.575 m)   Wt 205 lb (93 kg)   LMP 01/10/2000   SpO2 97%   BMI 37.49 kg/m   HENT:  Head: Normocephalic and atraumatic.  Right Ear: Hearing normal.  Left Ear: Hearing normal.  Eyes: Conjunctivae are normal. No scleral icterus.  Neck: Normal range of motion. Neck supple. No thyromegaly present.  Cardiovascular: Regular rhythm and normal heart sounds.  Bradycardia present.   Pulses:      Radial pulses are 2+ on the right side, and 2+ on the left side.  Pulmonary/Chest: Effort normal and breath sounds normal.  Lymphadenopathy:       Head (right side): No tonsillar, no preauricular, no posterior auricular and no occipital adenopathy present.       Head (left side): No tonsillar, no preauricular, no posterior auricular and no occipital adenopathy present.    She has no cervical adenopathy.       Right: No supraclavicular adenopathy present.       Left: No supraclavicular adenopathy present.  Neurological: She is alert and oriented to person, place, and time. No sensory deficit.  Skin: Skin is warm, dry  and intact. No rash noted. No cyanosis or erythema. Nails show no clubbing.  Psychiatric: She has a normal mood and affect. Her speech is normal and behavior is normal.           Assessment & Plan:   1. Type 2 diabetes mellitus with other diabetic kidney complication, without long-term current use of insulin (HCC) 2. 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 Adjust regimen as indicated by results. - Comprehensive metabolic panel - Hemoglobin A1c  3. Hyperlipidemia Await labs. Adjust regimen as indicated by results. - Comprehensive metabolic panel - Lipid panel - lovastatin (MEVACOR) 40 MG tablet; Take 1 tablet (40 mg total) by mouth at bedtime.  Dispense: 90 tablet; Refill: 3  4. Essential hypertension Await labs. Adjust regimen as indicated by results. - CBC  with Differential/Platelet - Comprehensive metabolic panel  5. Dizziness Possibly due to mild dehydration, as she isn't drinking much, but may also represent symptomatic bradycardia. She will increase her water intake and if the episodes persist, will plan to refer back to cardiology.  Fara Chute, PA-C Physician Assistant-Certified [facility]

## 2015-10-16 NOTE — Patient Instructions (Addendum)
Check your pulse when these episodes happen. If it's below 60 regularly, we may need to adjust the Tenoretic, or have you go back to the cardiologist. Work to drink 64 ounces of water each day. Ask about the injectable seasonal flu vaccine-it's a KILLED virus vaccine, and should be ok with Teresa's recent transplant.    IF you received an x-ray today, you will receive an invoice from [facility]. Please contact [facility] at [phone] with questions or concerns regarding your invoice.   IF you received labwork today, you will receive an invoice from [facility]. Please contact [facility] at [phone] with questions or concerns regarding your invoice.   Our billing staff will not be able to assist you with questions regarding bills from these companies.  You will be contacted with the lab results as soon as they are available. The fastest way to get your results is to activate your My Chart account. Instructions are located on the last page of this paperwork. If you have not heard from us regarding the results in 2 weeks, please contact this office.

## 2015-10-17 LAB — HEMOGLOBIN A1C
HEMOGLOBIN A1C: 5.9 % — AB (ref <5.7)
MEAN PLASMA GLUCOSE: 123 mg/dL

## 2015-10-18 ENCOUNTER — Encounter: Payer: Self-pay | Admitting: Physician Assistant

## 2015-11-27 LAB — HM DIABETES EYE EXAM

## 2015-11-30 ENCOUNTER — Other Ambulatory Visit: Payer: Self-pay | Admitting: Physician Assistant

## 2015-11-30 DIAGNOSIS — Z1231 Encounter for screening mammogram for malignant neoplasm of breast: Secondary | ICD-10-CM

## 2015-12-03 ENCOUNTER — Other Ambulatory Visit: Payer: Self-pay | Admitting: Physician Assistant

## 2015-12-03 NOTE — Telephone Encounter (Signed)
10/16/2015 last ov and labs Next visit 01/2016

## 2015-12-10 ENCOUNTER — Other Ambulatory Visit: Payer: Self-pay | Admitting: Physician Assistant

## 2015-12-10 DIAGNOSIS — E1122 Type 2 diabetes mellitus with diabetic chronic kidney disease: Secondary | ICD-10-CM

## 2015-12-10 DIAGNOSIS — I10 Essential (primary) hypertension: Secondary | ICD-10-CM

## 2015-12-16 ENCOUNTER — Other Ambulatory Visit: Payer: Self-pay | Admitting: Physician Assistant

## 2015-12-16 DIAGNOSIS — J302 Other seasonal allergic rhinitis: Secondary | ICD-10-CM

## 2015-12-16 DIAGNOSIS — H6592 Unspecified nonsuppurative otitis media, left ear: Secondary | ICD-10-CM

## 2015-12-24 ENCOUNTER — Encounter: Payer: Self-pay | Admitting: Physician Assistant

## 2016-01-07 ENCOUNTER — Ambulatory Visit: Payer: BC Managed Care – PPO

## 2016-01-15 ENCOUNTER — Encounter: Payer: Self-pay | Admitting: Physician Assistant

## 2016-01-15 ENCOUNTER — Ambulatory Visit (INDEPENDENT_AMBULATORY_CARE_PROVIDER_SITE_OTHER): Payer: BC Managed Care – PPO | Admitting: Physician Assistant

## 2016-01-15 VITALS — BP 130/84 | HR 55 | Temp 98.5°F | Resp 16 | Ht 62.0 in | Wt 190.8 lb

## 2016-01-15 DIAGNOSIS — I10 Essential (primary) hypertension: Secondary | ICD-10-CM

## 2016-01-15 DIAGNOSIS — E785 Hyperlipidemia, unspecified: Secondary | ICD-10-CM

## 2016-01-15 DIAGNOSIS — J069 Acute upper respiratory infection, unspecified: Secondary | ICD-10-CM | POA: Diagnosis not present

## 2016-01-15 DIAGNOSIS — B9789 Other viral agents as the cause of diseases classified elsewhere: Secondary | ICD-10-CM | POA: Diagnosis not present

## 2016-01-15 DIAGNOSIS — E1129 Type 2 diabetes mellitus with other diabetic kidney complication: Secondary | ICD-10-CM | POA: Diagnosis not present

## 2016-01-15 DIAGNOSIS — M2550 Pain in unspecified joint: Secondary | ICD-10-CM

## 2016-01-15 MED ORDER — ALBUTEROL SULFATE HFA 108 (90 BASE) MCG/ACT IN AERS: 2.0000 | INHALATION_SPRAY | Freq: Four times a day (QID) | RESPIRATORY_TRACT | 1 refills | Status: AC | PRN

## 2016-01-15 MED ORDER — VERAPAMIL HCL ER 360 MG PO CP24: 360.0000 mg | ORAL_CAPSULE | Freq: Every day | ORAL | 3 refills | Status: DC

## 2016-01-15 NOTE — Patient Instructions (Addendum)
Check the acetaminophen dose when you get home and let me know.    IF you received an x-ray today, you will receive an invoice from [facility]. Please contact [facility] at [phone] with questions or concerns regarding your invoice.   IF you received labwork today, you will receive an invoice from [facility]. Please contact [facility] at [phone] with questions or concerns regarding your invoice.   Our billing staff will not be able to assist you with questions regarding bills from these companies.  You will be contacted with the lab results as soon as they are available. The fastest way to get your results is to activate your My Chart account. Instructions are located on the last page of this paperwork. If you have not heard from us regarding the results in 2 weeks, please contact this office.

## 2016-01-15 NOTE — Progress Notes (Signed)
Patient ID: Tracy Kennedy, female    DOB: 28-May-1950, 65 y.o.   MRN: [id number]  PCP: [name], PA-C  Chief Complaint  Patient presents with  . Diabetes    Subjective:   Presents for evaluation of diabetes.  Home glucose readings avg 99. Highest reading 111. Go Lo diet plan has allowed her to lose 15 lbs since 10/2015. Addresses insulin resistance, requires home cooking, walking for exercise. Started at 208 lbs, 11/12/2015. Has logged 51 miles of walking since starting. Blood pressure still up and down. Systolic sometimes 656'C, sometimes 110's.  Starting to have worsening joint pain. Worse when she first gets up after resting for a while. Has been taking glucosamine. Acetaminophen every morning, then another dose prn.. Hips are worst. Low back. RIGHT leg (sometimes sciatica, sometimes feels like it's something "on the inside," occasionally knees, often feet). Also thumbs (thenar eminence).   Review of Systems     Patient Active Problem List   Diagnosis Date Noted  . Rhinitis 06/19/2015  . Obesity (BMI 30-39.9) 10/30/2013  . 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 DM (diabetes mellitus), type 2 with renal complications ([place])   . AORTIC VALVE DISORDERS 10/26/2008  . Hyperlipidemia 10/25/2008  . Essential hypertension 10/25/2008  . Asthma 10/25/2008  . GERD 10/25/2008  . MURMUR 10/25/2008  . CHEST PAIN 10/25/2008     Prior to Admission medications   Medication Sig Start Date End Date Taking? Authorizing Provider  ACCU-CHEK AVIVA PLUS test strip TEST BLOOD SUGARS ONE TIME DAILY AS DIRECTED 11/03/13  Yes Mancel Bale, PA-C  aspirin 81 MG tablet Take 81 mg by mouth daily.   Yes Historical Provider, MD  atenolol-chlorthalidone (TENORETIC) 50-25 MG tablet Take 1 tablet by mouth daily. 06/19/15  Yes Jibril Mcminn, PA-C  BIOTIN PO Take by mouth daily.   Yes Historical Provider, MD  Blood Glucose Monitoring Suppl  (ONETOUCH VERIO) W/DEVICE KIT 1 Device by Does not apply route AC breakfast. 12/12/14  Yes Zakiya Sporrer, PA-C  CALCIUM PO Take by mouth.   Yes Historical Provider, MD  cetirizine (ZYRTEC) 10 MG tablet TAKE 1 TABLET(10 MG) BY MOUTH DAILY 12/17/15  Yes Dara Beidleman, PA-C  CINNAMON PO Take by mouth daily. Reported on 03/20/2015   Yes Historical Provider, MD  clobetasol (TEMOVATE) 0.05 % external solution  12/11/14  Yes Historical Provider, MD  Cyanocobalamin (VITAMIN B-12 PO) Take by mouth.   Yes Historical Provider, MD  GLUCOSAMINE PO Take by mouth.   Yes Historical Provider, MD  glucose blood test strip Use as instructed 12/14/14  Yes Jabir Dahlem, PA-C  ipratropium (ATROVENT) 0.03 % nasal spray Place 2 sprays into both nostrils 2 (two) times daily. 06/19/15  Yes [name], PA-C  Lancet Devices (LANCING DEVICE) MISC Use to test blood sugar daily. One box 10/20/12  Yes [name], PA-C  Lancets MISC Use to test blood sugar daily 10/11/12  Yes Ninamarie Keel, PA-C  lisinopril (PRINIVIL,ZESTRIL) 40 MG tablet TAKE 1 TABLET BY MOUTH DAILY 09/16/15  Yes Dareon Nunziato, PA-C  lisinopril (PRINIVIL,ZESTRIL) 40 MG tablet TAKE 1 TABLET BY MOUTH DAILY 12/11/15  Yes Mancel Bale, PA-C  lovastatin (MEVACOR) 40 MG tablet Take 1 tablet (40 mg total) by mouth at bedtime. 10/16/15  Yes Jayceion Lisenby, PA-C  omeprazole (PRILOSEC) 20 MG capsule Take 1 capsule (20 mg total) by mouth daily. 06/19/15  Yes Azarya Oconnell, PA-C  potassium chloride SA (K-DUR,KLOR-CON) 20 MEQ tablet TAKE 1 TABLET  BY MOUTH DAILY 09/16/15  Yes Shahira Fiske, PA-C  potassium chloride SA (K-DUR,KLOR-CON) 20 MEQ tablet TAKE 1 TABLET BY MOUTH DAILY 12/11/15  Yes Mancel Bale, PA-C  verapamil (VERELAN PM) 360 MG 24 hr capsule Take 1 capsule (360 mg total) by mouth daily. 12/12/14  Yes Johnnathan Hagemeister, PA-C  WELCHOL 625 MG tablet TAKE 3 TABLETS BY MOUTH TWICE DAILY WITH A MEAL 12/03/15  Yes Tonjua Rossetti, PA-C  albuterol (PROVENTIL HFA;VENTOLIN HFA)  108 (90 BASE) MCG/ACT inhaler Inhale 2 puffs into the lungs every 6 (six) hours as needed. Patient not taking: Reported on 01/15/2016 03/14/14   [name], PA-C     Allergies  Allergen Reactions  . Codeine     REACTION: nausea       Objective:  Physical Exam  Constitutional: She is oriented to person, place, and time. She appears well-developed and well-nourished. She is active and cooperative. No distress.  BP 130/84   Pulse (!) 55   Temp 98.5 F (36.9 C) (Oral)   Resp 16   Ht _0  (1.575 m)   Wt 190 lb 12.8 oz (86.5 kg)   LMP 01/10/2000   SpO2 96%   BMI 34.90 kg/m   HENT:  Head: Normocephalic and atraumatic.  Right Ear: Hearing normal.  Left Ear: Hearing normal.  Eyes: Conjunctivae are normal. No scleral icterus.  Neck: Normal range of motion. Neck supple. No thyromegaly present.  Cardiovascular: Normal rate, regular rhythm and normal heart sounds.   Pulses:      Radial pulses are 2+ on the right side, and 2+ on the left side.  Pulmonary/Chest: Effort normal and breath sounds normal.  Lymphadenopathy:       Head (right side): No tonsillar, no preauricular, no posterior auricular and no occipital adenopathy present.       Head (left side): No tonsillar, no preauricular, no posterior auricular and no occipital adenopathy present.    She has no cervical adenopathy.       Right: No supraclavicular adenopathy present.       Left: No supraclavicular adenopathy present.  Neurological: She is alert and oriented to person, place, and time. No sensory deficit.  Skin: Skin is warm, dry and intact. No rash noted. No cyanosis or erythema. Nails show no clubbing.  Psychiatric: She has a normal mood and affect. Her speech is normal and behavior is normal.           Assessment & Plan:   1. Type 2 diabetes mellitus with other diabetic kidney complication, without long-term current use of insulin ([place]) Await labs. Adjust regimen as indicated by results. - Comprehensive  metabolic panel - Hemoglobin A1c  2. Essential hypertension Controlled.  - CBC with Differential/Platelet - Comprehensive metabolic panel - TSH - verapamil (VERELAN PM) 360 MG 24 hr capsule; Take 1 capsule (360 mg total) by mouth daily.  Dispense: 90 capsule; Refill: 3  3. Hyperlipidemia, unspecified hyperlipidemia type Await labs. Adjust regimen as indicated by results. - Comprehensive metabolic panel - Lipid panel  4. Pain in joint, multiple sites Await labs. Stay active. Acetaminophen.  - TSH - Rheumatoid Arthritis Profile  5. wheezing Usually with exertion, worse in the colder months. - albuterol (PROVENTIL HFA;VENTOLIN HFA) 108 (90 Base) MCG/ACT inhaler; Inhale 2 puffs into the lungs every 6 (six) hours as needed.  Dispense: 1 Inhaler; Refill: 1     Fara Chute, PA-C Physician Assistant-Certified [facility]

## 2016-01-16 ENCOUNTER — Encounter: Payer: Self-pay | Admitting: Physician Assistant

## 2016-01-16 ENCOUNTER — Other Ambulatory Visit: Payer: Self-pay | Admitting: Physician Assistant

## 2016-01-16 DIAGNOSIS — I10 Essential (primary) hypertension: Secondary | ICD-10-CM

## 2016-01-16 NOTE — Telephone Encounter (Signed)
Last visit 01/15/16

## 2016-01-17 LAB — CBC WITH DIFFERENTIAL/PLATELET
BASOS ABS: 0 10*3/uL (ref 0.0–0.2)
Basos: 1 %
EOS (ABSOLUTE): 0.1 10*3/uL (ref 0.0–0.4)
Eos: 2 %
HEMOGLOBIN: 13 g/dL (ref 11.1–15.9)
Hematocrit: 41.6 % (ref 34.0–46.6)
IMMATURE GRANULOCYTES: 0 %
Immature Grans (Abs): 0 10*3/uL (ref 0.0–0.1)
LYMPHS ABS: 1.8 10*3/uL (ref 0.7–3.1)
Lymphs: 29 %
MCH: 28.9 pg (ref 26.6–33.0)
MCHC: 31.3 g/dL — AB (ref 31.5–35.7)
MCV: 92 fL (ref 79–97)
MONOCYTES: 9 %
Monocytes Absolute: 0.5 10*3/uL (ref 0.1–0.9)
NEUTROS PCT: 59 %
Neutrophils Absolute: 3.8 10*3/uL (ref 1.4–7.0)
Platelets: 278 10*3/uL (ref 150–379)
RBC: 4.5 x10E6/uL (ref 3.77–5.28)
RDW: 14.5 % (ref 12.3–15.4)
WBC: 6.3 10*3/uL (ref 3.4–10.8)

## 2016-01-17 LAB — COMPREHENSIVE METABOLIC PANEL
ALBUMIN: 4.1 g/dL (ref 3.6–4.8)
ALK PHOS: 66 IU/L (ref 39–117)
ALT: 23 IU/L (ref 0–32)
AST: 20 IU/L (ref 0–40)
Albumin/Globulin Ratio: 1.5 (ref 1.2–2.2)
BUN/Creatinine Ratio: 30 — ABNORMAL HIGH (ref 12–28)
BUN: 14 mg/dL (ref 8–27)
Bilirubin Total: 0.3 mg/dL (ref 0.0–1.2)
CALCIUM: 9.7 mg/dL (ref 8.7–10.3)
CO2: 28 mmol/L (ref 18–29)
CREATININE: 0.47 mg/dL — AB (ref 0.57–1.00)
Chloride: 103 mmol/L (ref 96–106)
GFR calc Af Amer: 122 mL/min/{1.73_m2} (ref >59)
GFR calc non Af Amer: 105 mL/min/{1.73_m2} (ref >59)
GLUCOSE: 102 mg/dL — AB (ref 65–99)
Globulin, Total: 2.8 g/dL (ref 1.5–4.5)
Potassium: 3.9 mmol/L (ref 3.5–5.2)
Sodium: 145 mmol/L — ABNORMAL HIGH (ref 134–144)
Total Protein: 6.9 g/dL (ref 6.0–8.5)

## 2016-01-17 LAB — LIPID PANEL
CHOL/HDL RATIO: 3 ratio (ref 0.0–4.4)
Cholesterol, Total: 157 mg/dL (ref 100–199)
HDL: 53 mg/dL (ref >39)
LDL CALC: 80 mg/dL (ref 0–99)
Triglycerides: 118 mg/dL (ref 0–149)
VLDL Cholesterol Cal: 24 mg/dL (ref 5–40)

## 2016-01-17 LAB — RHEUMATOID ARTHRITIS PROFILE
CYCLIC CITRULLIN PEPTIDE AB: 13 U (ref 0–19)
Rhuematoid fact SerPl-aCnc: <10 IU/mL (ref 0.0–13.9)

## 2016-01-17 LAB — HEMOGLOBIN A1C
Est. average glucose Bld gHb Est-mCnc: 120 mg/dL
HEMOGLOBIN A1C: 5.8 % — AB (ref 4.8–5.6)

## 2016-01-17 LAB — TSH: TSH: 0.815 u[IU]/mL (ref 0.450–4.500)

## 2016-02-08 ENCOUNTER — Ambulatory Visit
Admission: RE | Admit: 2016-02-08 | Discharge: 2016-02-08 | Disposition: A | Discharge status: Home / Self Care | Payer: BC Managed Care – PPO | Source: Ambulatory Visit | Attending: Physician Assistant | Admitting: Physician Assistant

## 2016-02-08 DIAGNOSIS — Z1231 Encounter for screening mammogram for malignant neoplasm of breast: Secondary | ICD-10-CM

## 2016-02-25 ENCOUNTER — Encounter: Payer: Self-pay | Admitting: Physician Assistant

## 2016-03-26 ENCOUNTER — Other Ambulatory Visit: Payer: Self-pay | Admitting: Physician Assistant

## 2016-03-26 DIAGNOSIS — I10 Essential (primary) hypertension: Secondary | ICD-10-CM

## 2016-04-04 ENCOUNTER — Other Ambulatory Visit: Payer: Self-pay | Admitting: Physician Assistant

## 2016-04-04 DIAGNOSIS — H6592 Unspecified nonsuppurative otitis media, left ear: Secondary | ICD-10-CM

## 2016-04-04 DIAGNOSIS — J302 Other seasonal allergic rhinitis: Secondary | ICD-10-CM

## 2016-04-04 DIAGNOSIS — E1122 Type 2 diabetes mellitus with diabetic chronic kidney disease: Secondary | ICD-10-CM

## 2016-04-04 NOTE — Telephone Encounter (Signed)
BP Readings from Last 3 Encounters:  01/15/16 130/84  10/16/15 136/80  06/19/15 (!) 162/82

## 2016-04-08 ENCOUNTER — Other Ambulatory Visit: Payer: Self-pay | Admitting: Physician Assistant

## 2016-04-08 DIAGNOSIS — I10 Essential (primary) hypertension: Secondary | ICD-10-CM

## 2016-04-15 ENCOUNTER — Ambulatory Visit: Payer: BC Managed Care – PPO | Admitting: Physician Assistant

## 2016-04-16 ENCOUNTER — Other Ambulatory Visit: Payer: Self-pay | Admitting: Physician Assistant

## 2016-04-16 DIAGNOSIS — I10 Essential (primary) hypertension: Secondary | ICD-10-CM

## 2016-04-29 ENCOUNTER — Encounter: Payer: Self-pay | Admitting: Physician Assistant

## 2016-04-29 ENCOUNTER — Ambulatory Visit (INDEPENDENT_AMBULATORY_CARE_PROVIDER_SITE_OTHER): Payer: BC Managed Care – PPO | Admitting: Physician Assistant

## 2016-04-29 VITALS — BP 163/72 | HR 59 | Temp 98.3°F | Resp 16 | Ht 62.0 in | Wt 195.0 lb

## 2016-04-29 DIAGNOSIS — R809 Proteinuria, unspecified: Secondary | ICD-10-CM | POA: Diagnosis not present

## 2016-04-29 DIAGNOSIS — E1129 Type 2 diabetes mellitus with other diabetic kidney complication: Secondary | ICD-10-CM

## 2016-04-29 DIAGNOSIS — E785 Hyperlipidemia, unspecified: Secondary | ICD-10-CM

## 2016-04-29 DIAGNOSIS — I10 Essential (primary) hypertension: Secondary | ICD-10-CM

## 2016-04-29 MED ORDER — VALSARTAN 320 MG PO TABS: 320.0000 mg | ORAL_TABLET | Freq: Every day | ORAL | 3 refills | Status: AC

## 2016-04-29 NOTE — Patient Instructions (Addendum)
STOP the lisinopril. START the Diovan. If this isn't working, we'll add HYDRALAZINE.    IF you received an x-ray today, you will receive an invoice from [facility]. Please contact [facility] at [phone] with questions or concerns regarding your invoice.   IF you received labwork today, you will receive an invoice from [facility]. Please contact [facility] at [phone] with questions or concerns regarding your invoice.   Our billing staff will not be able to assist you with questions regarding bills from these companies.  You will be contacted with the lab results as soon as they are available. The fastest way to get your results is to activate your My Chart account. Instructions are located on the last page of this paperwork. If you have not heard from us regarding the results in 2 weeks, please contact this office.

## 2016-04-29 NOTE — Progress Notes (Signed)
Patient ID: Tracy Kennedy, female    DOB: 08-25-1950, 66 y.o.   MRN: [id number]  PCP: [name], PA-C  Chief Complaint  Patient presents with  . Follow-up    3 month DM check    Subjective:   Presents for evaluation of diabetes.  Continues GoLo weigt loss plan. Clarene Critchley (wife, recently underwent bone marrow transplant) can taste again, so eating healthy is harder.  Notes her BP remains elevated. She has been reluctant to add additional BP lowering meds, but is concerned now.  Stopped both the atorvastatin and the Welchol on 01/29/16, not realizing that they were not both statin drugs. The muscle pain resolved.  s/p bunionectomy bilaterally. On the top of the RIGHT foot she has developed a tender place. Raised up.  LEFT foot plantar surface, outer distal foot. ?Callous ?Plantar wart? Has scheduled a podiatry visit.     Review of Systems Denies chest pain, shortness of breath, HA, dizziness, vision change, nausea, vomiting, diarrhea, constipation, melena, hematochezia, dysuria, increased urinary urgency or frequency, increased hunger or thirst, unintentional weight change, other unexplained myalgias or arthralgias, rash.     Patient Active Problem List   Diagnosis Date Noted  . Rhinitis 06/19/2015  . Obesity (BMI 30-39.9) 10/30/2013  . 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 DM (diabetes mellitus), type 2 with renal complications ([place])   . AORTIC VALVE DISORDERS 10/26/2008  . Hyperlipidemia 10/25/2008  . Essential hypertension 10/25/2008  . Asthma 10/25/2008  . GERD 10/25/2008  . MURMUR 10/25/2008  . CHEST PAIN 10/25/2008     Prior to Admission medications   Medication Sig Start Date End Date Taking? Authorizing Provider  ACETAMINOPHEN PO Take by mouth 2 (two) times daily.   Yes Historical Provider, MD  albuterol (PROVENTIL HFA;VENTOLIN HFA) 108 (90 Base) MCG/ACT inhaler Inhale 2 puffs into the lungs every  6 (six) hours as needed. 01/15/16  Yes Odessa Nishi, PA-C  aspirin 81 MG tablet Take 81 mg by mouth daily.   Yes Historical Provider, MD  atenolol-chlorthalidone (TENORETIC) 50-25 MG tablet Take 1 tablet by mouth daily. 06/19/15  Yes Alquan Morrish, PA-C  BIOTIN PO Take by mouth daily.   Yes Historical Provider, MD  Blood Glucose Monitoring Suppl (ONETOUCH VERIO) W/DEVICE KIT 1 Device by Does not apply route AC breakfast. 12/12/14  Yes Haris Baack, PA-C  CALCIUM PO Take by mouth.   Yes Historical Provider, MD  cetirizine (ZYRTEC) 10 MG tablet TAKE 1 TABLET(10 MG) BY MOUTH DAILY 04/04/16  Yes Jaynee Eagles, PA-C  CINNAMON PO Take by mouth daily. Reported on 03/20/2015   Yes Historical Provider, MD  clobetasol (TEMOVATE) 0.05 % external solution  12/11/14  Yes Historical Provider, MD  Cyanocobalamin (VITAMIN B-12 PO) Take by mouth.   Yes Historical Provider, MD  GLUCOSAMINE PO Take by mouth.   Yes Historical Provider, MD  ipratropium (ATROVENT) 0.03 % nasal spray Place 2 sprays into both nostrils 2 (two) times daily. 06/19/15  Yes [name], PA-C  Lancet Devices (LANCING DEVICE) MISC Use to test blood sugar daily. One box 10/20/12  Yes [name], PA-C  Lancets MISC Use to test blood sugar daily 10/11/12  Yes Sander Speckman, PA-C  lisinopril (PRINIVIL,ZESTRIL) 40 MG tablet TAKE 1 TABLET BY MOUTH DAILY 09/16/15  Yes Lindee Leason, PA-C  omeprazole (PRILOSEC) 20 MG capsule Take 1 capsule (20 mg total) by mouth daily. 06/19/15  Yes Aydden Cumpian, PA-C  ONETOUCH VERIO test strip TEST AS  DIRECTED 01/16/16  Yes Hildreth Orsak, PA-C  potassium chloride SA (K-DUR,KLOR-CON) 20 MEQ tablet TAKE 1 TABLET BY MOUTH DAILY 09/16/15  Yes Kou Gucciardo, PA-C  verapamil (VERELAN PM) 360 MG 24 hr capsule TAKE ONE CAPSULE BY MOUTH EVERY DAY 04/16/16  Yes Magnolia Mattila, PA-C  lovastatin (MEVACOR) 40 MG tablet Take 1 tablet (40 mg total) by mouth at bedtime. Patient not taking: Reported on 04/29/2016 10/16/15   [name],  PA-C      Allergies  Allergen Reactions  . Codeine     REACTION: nausea       Objective:  Physical Exam  Constitutional: She is oriented to person, place, and time. She appears well-developed and well-nourished. She is active and cooperative. No distress.  BP (!) 163/72   Pulse (!) 59   Temp 98.3 F (36.8 C) (Oral)   Resp 16   Ht _0  (1.575 m)   Wt 195 lb (88.5 kg)   LMP 01/10/2000   SpO2 96%   BMI 35.67 kg/m   HENT:  Head: Normocephalic and atraumatic.  Right Ear: Hearing normal.  Left Ear: Hearing normal.  Eyes: Conjunctivae are normal. No scleral icterus.  Neck: Normal range of motion. Neck supple. No thyromegaly present.  Cardiovascular: Normal rate, regular rhythm and normal heart sounds.   Pulses:      Radial pulses are 2+ on the right side, and 2+ on the left side.  Pulmonary/Chest: Effort normal and breath sounds normal.  Lymphadenopathy:       Head (right side): No tonsillar, no preauricular, no posterior auricular and no occipital adenopathy present.       Head (left side): No tonsillar, no preauricular, no posterior auricular and no occipital adenopathy present.    She has no cervical adenopathy.       Right: No supraclavicular adenopathy present.       Left: No supraclavicular adenopathy present.  Neurological: She is alert and oriented to person, place, and time. No sensory deficit.  Skin: Skin is warm, dry and intact. No rash noted. No cyanosis or erythema. Nails show no clubbing.  Psychiatric: She has a normal mood and affect. Her speech is normal and behavior is normal.       Diabetic Foot Exam - Simple   Simple Foot Form Diabetic Foot exam was performed with the following findings:  Yes 04/29/2016  9:17 AM  Visual Inspection No deformities, no ulcerations, no other skin breakdown bilaterally:  Yes Sensation Testing Intact to touch and monofilament testing bilaterally:  Yes Pulse Check Posterior Tibialis and Dorsalis pulse intact bilaterally:   Yes Comments        Assessment & Plan:   1. Type 2 diabetes mellitus with other diabetic kidney complication, without long-term current use of insulin ([place]) Await labs. Adjust regimen as indicated by results. - Comprehensive metabolic panel - Hemoglobin A1c - Microalbumin, urine - HM Diabetes Foot Exam  2. 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 - Microalbumin, urine  3. Essential hypertension Not controlled. Stop lisinopril. Start Diovan. If BP remains uncontrolled at next visit, plan addition of hydralazine. - CBC with Differential/Platelet - Comprehensive metabolic panel - valsartan (DIOVAN) 320 MG tablet; Take 1 tablet (320 mg total) by mouth daily.  Dispense: 90 tablet; Refill: 3  4. Hyperlipidemia, unspecified hyperlipidemia type Off statin and Welchol since 01/28/2017. If needed, will resume Welchol. - Comprehensive metabolic panel - Lipid panel   Return in about 3 months (around 07/30/2016) for follow-up  diabetes and blood pressure.    Fara Chute, PA-C Physician Assistant-Certified [facility] [place]

## 2016-04-30 LAB — COMPREHENSIVE METABOLIC PANEL
A/G RATIO: 1.3 (ref 1.2–2.2)
ALBUMIN: 4 g/dL (ref 3.6–4.8)
ALT: 19 IU/L (ref 0–32)
AST: 16 IU/L (ref 0–40)
Alkaline Phosphatase: 64 IU/L (ref 39–117)
BUN / CREAT RATIO: 38 — AB (ref 12–28)
BUN: 21 mg/dL (ref 8–27)
CALCIUM: 9.8 mg/dL (ref 8.7–10.3)
CHLORIDE: 101 mmol/L (ref 96–106)
CO2: 26 mmol/L (ref 18–29)
Creatinine, Ser: 0.55 mg/dL — ABNORMAL LOW (ref 0.57–1.00)
GFR calc Af Amer: 115 mL/min/{1.73_m2} (ref >59)
GFR, EST NON AFRICAN AMERICAN: 100 mL/min/{1.73_m2} (ref >59)
GLUCOSE: 108 mg/dL — AB (ref 65–99)
Globulin, Total: 3 g/dL (ref 1.5–4.5)
POTASSIUM: 3.6 mmol/L (ref 3.5–5.2)
Sodium: 142 mmol/L (ref 134–144)
Total Protein: 7 g/dL (ref 6.0–8.5)

## 2016-04-30 LAB — CBC WITH DIFFERENTIAL/PLATELET
BASOS ABS: 0 10*3/uL (ref 0.0–0.2)
Basos: 1 %
EOS (ABSOLUTE): 0.1 10*3/uL (ref 0.0–0.4)
Eos: 2 %
Hematocrit: 40.3 % (ref 34.0–46.6)
Hemoglobin: 13.1 g/dL (ref 11.1–15.9)
Immature Grans (Abs): 0 10*3/uL (ref 0.0–0.1)
Immature Granulocytes: 0 %
Lymphocytes Absolute: 1.9 10*3/uL (ref 0.7–3.1)
Lymphs: 33 %
MCH: 28.7 pg (ref 26.6–33.0)
MCHC: 32.5 g/dL (ref 31.5–35.7)
MCV: 88 fL (ref 79–97)
MONOS ABS: 0.5 10*3/uL (ref 0.1–0.9)
Monocytes: 9 %
Neutrophils Absolute: 3.3 10*3/uL (ref 1.4–7.0)
Neutrophils: 55 %
Platelets: 279 10*3/uL (ref 150–379)
RBC: 4.57 x10E6/uL (ref 3.77–5.28)
RDW: 13.9 % (ref 12.3–15.4)
WBC: 5.9 10*3/uL (ref 3.4–10.8)

## 2016-04-30 LAB — MICROALBUMIN, URINE: MICROALBUM., U, RANDOM: 104.9 ug/mL

## 2016-04-30 LAB — LIPID PANEL
Chol/HDL Ratio: 4.8 ratio units — ABNORMAL HIGH (ref 0.0–4.4)
Cholesterol, Total: 255 mg/dL — ABNORMAL HIGH (ref 100–199)
HDL: 53 mg/dL (ref >39)
LDL Calculated: 178 mg/dL — ABNORMAL HIGH (ref 0–99)
Triglycerides: 120 mg/dL (ref 0–149)
VLDL CHOLESTEROL CAL: 24 mg/dL (ref 5–40)

## 2016-04-30 LAB — HEMOGLOBIN A1C
Est. average glucose Bld gHb Est-mCnc: 117 mg/dL
Hgb A1c MFr Bld: 5.7 % — ABNORMAL HIGH (ref 4.8–5.6)

## 2016-05-02 ENCOUNTER — Other Ambulatory Visit: Payer: Self-pay | Admitting: Physician Assistant

## 2016-05-07 ENCOUNTER — Ambulatory Visit (INDEPENDENT_AMBULATORY_CARE_PROVIDER_SITE_OTHER): Payer: BC Managed Care – PPO | Admitting: Podiatry

## 2016-05-07 ENCOUNTER — Ambulatory Visit (INDEPENDENT_AMBULATORY_CARE_PROVIDER_SITE_OTHER): Payer: BC Managed Care – PPO

## 2016-05-07 ENCOUNTER — Encounter: Payer: Self-pay | Admitting: Podiatry

## 2016-05-07 DIAGNOSIS — T8484XA Pain due to internal orthopedic prosthetic devices, implants and grafts, initial encounter: Secondary | ICD-10-CM | POA: Diagnosis not present

## 2016-05-07 DIAGNOSIS — M67471 Ganglion, right ankle and foot: Secondary | ICD-10-CM

## 2016-05-07 DIAGNOSIS — M79671 Pain in right foot: Secondary | ICD-10-CM

## 2016-05-07 DIAGNOSIS — Q828 Other specified congenital malformations of skin: Secondary | ICD-10-CM

## 2016-05-10 NOTE — Progress Notes (Signed)
   Subjective:  66 year old female presents today to the office for evaluation but not on the dorsal aspect of the right foot 3 months. She states that the knot has grown in size over time. She also complains of a painful callus lesion to the plantar aspect of the left foot. Patient used to see Dr. Ralene Cork in the past. Patient presents today for further treatment and evaluation    Objective/Physical Exam General: The patient is alert and oriented x3 in no acute distress.  Dermatology: Hyperkeratotic porokeratosis noted to the lateral aspect of the fifth MPJ left foot. Skin is warm, dry and supple bilateral lower extremities. Negative for open lesions or macerations.  Vascular: Palpable pedal pulses bilaterally. No edema or erythema noted. Capillary refill within normal limits.  Neurological: Epicritic and protective threshold grossly intact bilaterally.   Musculoskeletal Exam: There is a palpable ganglion cyst noted to the dorsal aspect of the right foot. This mass appears to be fluctuant however it is not rupture with direct compression. Range of motion within normal limits to all pedal and ankle joints bilateral. Muscle strength 5/5 in all groups bilateral.   Radiographic Exam:  Orthopedic hardware noted to the right foot which appears to be intact and stable. There is some pain relating to the orthopedic hardware however.  Assessment: #1 ganglion cyst right foot #2 porokeratosis sub-fifth MPJ left foot #3 painful orthopedic hardware right foot   Plan of Care:  #1 Patient was evaluated. #2 aspiration of the ganglion cyst was attempted today after local anesthesia infiltration. The ganglion cyst was not able to be aspirated. Compressive dressing applied. #3 informed the patient that if the ganglion cyst does not resolve over the next 4 weeks we may need to go in surgically to remove it since it is symptomatic. #4 return to clinic in 4 weeks   Felecia Shelling, DPM [facility]  Dr. Felecia Shelling, DPM    [address]                                        [place], [place] [address]                Office [phone]  Fax [phone]

## 2016-05-21 ENCOUNTER — Ambulatory Visit: Payer: BC Managed Care – PPO | Admitting: Podiatry

## 2016-06-16 ENCOUNTER — Ambulatory Visit (INDEPENDENT_AMBULATORY_CARE_PROVIDER_SITE_OTHER): Payer: BC Managed Care – PPO | Admitting: Podiatry

## 2016-06-16 ENCOUNTER — Encounter: Payer: Self-pay | Admitting: Podiatry

## 2016-06-16 DIAGNOSIS — M779 Enthesopathy, unspecified: Principal | ICD-10-CM

## 2016-06-16 DIAGNOSIS — M7751 Other enthesopathy of right foot: Secondary | ICD-10-CM

## 2016-06-16 DIAGNOSIS — M67471 Ganglion, right ankle and foot: Secondary | ICD-10-CM

## 2016-06-16 DIAGNOSIS — M778 Other enthesopathies, not elsewhere classified: Secondary | ICD-10-CM

## 2016-06-18 NOTE — Progress Notes (Signed)
   Subjective:  66 year old female presents today to the office for follow up evaluation of a cyst to the dorsal aspect of the right foot. She states that the knot has reduced in size since the last visit. She reports wearing compression socks which help alleviate the pain. Patient presents today for further treatment and evaluation    Objective/Physical Exam General: The patient is alert and oriented x3 in no acute distress.  Dermatology: Hyperkeratotic porokeratosis noted to the lateral aspect of the fifth MPJ left foot. Skin is warm, dry and supple bilateral lower extremities. Negative for open lesions or macerations.  Vascular: Palpable pedal pulses bilaterally. No edema or erythema noted. Capillary refill within normal limits.  Neurological: Epicritic and protective threshold grossly intact bilaterally.   Musculoskeletal Exam: There is a palpable ganglion cyst noted to the dorsal aspect of the right foot. This mass appears to be fluctuant however it is not rupture with direct compression. Range of motion within normal limits to all pedal and ankle joints bilateral. Muscle strength 5/5 in all groups bilateral.    Assessment: #1 ganglion cyst right foot #2 dorsal bone spur right foot #3 capsulitis right foot   Plan of Care:  #1 Patient was evaluated. #2 Injection of 0.5 mLs Celestone Soluspan injected into the right midfoot #3 Continue wearing compression anklet #4 Return to clinic as needed   Felecia ShellingBrent M. Evans, DPM [facility]  Dr. Felecia ShellingBrent M. Evans, DPM    [address]                                        [place], [place] [address]                Office [phone]  Fax [phone]

## 2016-06-24 ENCOUNTER — Encounter: Payer: Self-pay | Admitting: Physician Assistant

## 2016-06-24 ENCOUNTER — Other Ambulatory Visit: Payer: Self-pay | Admitting: Physician Assistant

## 2016-06-24 DIAGNOSIS — R809 Proteinuria, unspecified: Secondary | ICD-10-CM

## 2016-06-24 DIAGNOSIS — E1129 Type 2 diabetes mellitus with other diabetic kidney complication: Secondary | ICD-10-CM

## 2016-06-24 DIAGNOSIS — I10 Essential (primary) hypertension: Secondary | ICD-10-CM

## 2016-06-24 MED ORDER — BETAMETHASONE SOD PHOS & ACET 6 (3-3) MG/ML IJ SUSP: 3.0000 mg | Freq: Once | INTRAMUSCULAR | Status: AC

## 2016-07-01 ENCOUNTER — Other Ambulatory Visit: Payer: Self-pay | Admitting: Physician Assistant

## 2016-08-01 LAB — CBC AND DIFFERENTIAL
HEMATOCRIT: 38 (ref 36–46)
HEMOGLOBIN: 13.2 (ref 12.0–16.0)
WBC: 8.1

## 2016-08-05 ENCOUNTER — Encounter: Payer: Self-pay | Admitting: Physician Assistant

## 2016-08-05 ENCOUNTER — Ambulatory Visit (INDEPENDENT_AMBULATORY_CARE_PROVIDER_SITE_OTHER): Payer: BC Managed Care – PPO | Admitting: Physician Assistant

## 2016-08-05 VITALS — BP 154/77 | HR 54 | Temp 98.1°F | Resp 18 | Ht 62.0 in | Wt 196.8 lb

## 2016-08-05 DIAGNOSIS — R42 Dizziness and giddiness: Secondary | ICD-10-CM

## 2016-08-05 DIAGNOSIS — I10 Essential (primary) hypertension: Secondary | ICD-10-CM | POA: Diagnosis not present

## 2016-08-05 DIAGNOSIS — E1129 Type 2 diabetes mellitus with other diabetic kidney complication: Secondary | ICD-10-CM | POA: Diagnosis not present

## 2016-08-05 DIAGNOSIS — R809 Proteinuria, unspecified: Secondary | ICD-10-CM

## 2016-08-05 DIAGNOSIS — E669 Obesity, unspecified: Secondary | ICD-10-CM | POA: Diagnosis not present

## 2016-08-05 DIAGNOSIS — E785 Hyperlipidemia, unspecified: Secondary | ICD-10-CM

## 2016-08-05 DIAGNOSIS — L309 Dermatitis, unspecified: Secondary | ICD-10-CM

## 2016-08-05 MED ORDER — TRIAMCINOLONE ACETONIDE 0.025 % EX OINT: 1.0000 "application " | TOPICAL_OINTMENT | Freq: Two times a day (BID) | CUTANEOUS | 0 refills | Status: AC

## 2016-08-05 NOTE — Assessment & Plan Note (Signed)
COntinue efforts to control diabetes and HTN. Follow-up with Dr. Marisue HumbleSanford.

## 2016-08-05 NOTE — Patient Instructions (Addendum)
MOISTURIZE the skin patches under your eyes.  Stop using the things that burn. Use the steroid ointment twice daily (just a thin layer), and use it UNDER any other products you apply to the skin.  I will communicate with Dr. Marisue HumbleSanford regarding stopping the atenolol component of the one medication.    IF you received an x-ray today, you will receive an invoice from [facility]. Please contact [facility] at [phone] with questions or concerns regarding your invoice.   IF you received labwork today, you will receive an invoice from [facility]. Please contact [facility] at [phone] with questions or concerns regarding your invoice.   Our billing staff will not be able to assist you with questions regarding bills from these companies.  You will be contacted with the lab results as soon as they are available. The fastest way to get your results is to activate your My Chart account. Instructions are located on the last page of this paperwork. If you have not heard from us regarding the results in 2 weeks, please contact this office.

## 2016-08-05 NOTE — Assessment & Plan Note (Signed)
Requested labs from recent visit with Dr. Marisue HumbleSanford. If A1C not done, will ask that it be done at her next lab draw.

## 2016-08-05 NOTE — Assessment & Plan Note (Signed)
Remains above goal. Recent addition of spironolactone. Given the persistent episodes of near-syncope, recommend D/C atenolol. Will request Dr. Lily LovingsSanford's advice for alternative regarding HTN management.

## 2016-08-05 NOTE — Progress Notes (Signed)
Patient ID: Tracy Kennedy, female    DOB: [birth year], 66 y.o.   MRN: [id number]  PCP: [name], PA-C  Chief Complaint  Patient presents with  . Hypertension    Last visit BP 163/72, Today BP  . Follow-up    Subjective:   Presents for evaluation of HTN, diabetes, hyperlipidemia.  Saw Dr. Joelyn Oms last week and had labs done there. Doesn't want labs again here today. K+ stopped and started spironolactone. Follow-up is planned for 8/11. BP remains elevated, SBP 120's-170's.  Since starting losartan in 04/2016, she notes some erythema under the eyes. Has tried multiple products without benefit. Discussed this with Dr. Joelyn Oms, who recommends that she continue the losartan to preserve renal function.  She notes continues episodes of near-syncope. The episodes are very brief, only seconds. No triggers that she has identified. Since her last visit, she had a day with 25 episodes. She can go a month without having one. Usually, if they occur, she will notice several in one day. No associated CP, SOB, HA, nausea, weakness.  From my note 10/16/2015: Sometimes just out of the blue, sudden onset of dizziness, lasting 4-5 seconds, then resolves. Isn't sure when it started-several months ago, after Teresa's cancer diagnosis. Doesn't think it's stress related. Last episode was a couple of days ago when she was at her father's (he lives next door, and she is his primary care giver). Wonders if her BP is dropping. Has checked glucose when it happens and it was stable. Sometimes happens once, sometimes 4-5 times in a day. Not triggered by certain foods or eating in general. Usually sitting, either driving or talking. Not triggered by rapid position changes. Thought it was allergies, but she has no symptoms like rhinorrhea, stuffy nose, drainage, sore throat, ear buzzing or popping. Not drinking much water.    Review of Systems  Constitutional: Negative for activity change, appetite  change, fatigue and unexpected weight change.  HENT: Negative for congestion, dental problem, ear pain, hearing loss, mouth sores, postnasal drip, rhinorrhea, sneezing, sore throat, tinnitus and trouble swallowing.   Eyes: Negative for photophobia, pain, redness and visual disturbance.  Respiratory: Negative for cough, chest tightness and shortness of breath.   Cardiovascular: Negative for chest pain, palpitations and leg swelling.  Gastrointestinal: Negative for abdominal pain, blood in stool, constipation, diarrhea, nausea and vomiting.  Endocrine: Negative for cold intolerance, heat intolerance, polydipsia, polyphagia and polyuria.  Genitourinary: Negative for dysuria, frequency, hematuria and urgency.  Musculoskeletal: Negative for arthralgias, gait problem, myalgias and neck stiffness.  Skin: Negative for rash.  Neurological: Positive for light-headedness. Negative for dizziness, speech difficulty, weakness, numbness and headaches.  Hematological: Negative for adenopathy.  Psychiatric/Behavioral: Negative for confusion and sleep disturbance. The patient is not nervous/anxious.        Patient Active Problem List   Diagnosis Date Noted  . Rhinitis 06/19/2015  . Obesity (BMI 30-39.9) 10/30/2013  . 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 DM (diabetes mellitus), type 2 with renal complications ([place])   . AORTIC VALVE DISORDERS 10/26/2008  . Hyperlipidemia 10/25/2008  . Essential hypertension 10/25/2008  . Asthma 10/25/2008  . GERD 10/25/2008  . MURMUR 10/25/2008  . CHEST PAIN 10/25/2008     Prior to Admission medications   Medication Sig Start Date End Date Taking? Authorizing Provider  ACETAMINOPHEN PO Take by mouth 2 (two) times daily.   Yes [provider]  albuterol (PROVENTIL HFA;VENTOLIN HFA) 108 (90 Base) MCG/ACT  inhaler Inhale 2 puffs into the lungs every 6 (six) hours as needed. 01/15/16  Yes Ranelle Auker, PA-C    aspirin 81 MG tablet Take 81 mg by mouth daily.   Yes [provider]  atenolol-chlorthalidone (TENORETIC) 50-25 MG tablet Take 1 tablet by mouth daily. 06/19/15  Yes Chelbie Jarnagin, PA-C  BIOTIN PO Take by mouth daily.   Yes [provider]  Blood Glucose Monitoring Suppl (ONETOUCH VERIO) W/DEVICE KIT 1 Device by Does not apply route AC breakfast. 12/12/14  Yes Imre Vecchione, PA-C  CALCIUM PO Take by mouth.   Yes [provider]  cetirizine (ZYRTEC) 10 MG tablet TAKE 1 TABLET(10 MG) BY MOUTH DAILY 04/04/16  Yes Jaynee Eagles, PA-C  CINNAMON PO Take by mouth daily. Reported on 03/20/2015   Yes [provider]  clobetasol (TEMOVATE) 0.05 % external solution  12/11/14  Yes [provider]  Cyanocobalamin (VITAMIN B-12 PO) Take by mouth.   Yes [provider]  GLUCOSAMINE PO Take by mouth.   Yes [provider]  ipratropium (ATROVENT) 0.03 % nasal spray Place 2 sprays into both nostrils 2 (two) times daily. 06/19/15  Yes [name], PA-C  Lancet Devices (LANCING DEVICE) MISC Use to test blood sugar daily. One box 10/20/12  Yes Niki Payment, PA-C  Lancets MISC Use to test blood sugar daily 10/11/12  Yes Maili Shutters, PA-C  omeprazole (PRILOSEC) 20 MG capsule Take 1 capsule (20 mg total) by mouth daily. 06/19/15  Yes Fabio Wah, Domingo Mend, PA-C  ONETOUCH VERIO test strip TEST AS DIRECTED 05/02/16  Yes Hendricks Schwandt, PA-C  spironolactone (ALDACTONE) 25 MG tablet Take 25 mg by mouth daily.   Yes [provider]  valsartan (DIOVAN) 320 MG tablet Take 1 tablet (320 mg total) by mouth daily. 04/29/16  Yes Taneika Choi, PA-C  verapamil (VERELAN PM) 360 MG 24 hr capsule TAKE ONE CAPSULE BY MOUTH EVERY DAY 04/16/16  Yes [name], PA-C  WELCHOL 625 MG tablet TAKE 3 TABLETS BY MOUTH TWICE DAILY WITH A MEAL 07/02/16  Yes Alphonso Gregson, PA-C  potassium chloride SA (K-DUR,KLOR-CON) 20 MEQ tablet TAKE 1 TABLET BY MOUTH DAILY Patient not  taking: Reported on 08/05/2016 06/24/16   [name], PA-C     Allergies  Allergen Reactions  . Codeine     REACTION: nausea  . Statins     Muscle pain       Objective:  Physical Exam  Constitutional: She is oriented to person, place, and time. She appears well-developed and well-nourished. She is active and cooperative. No distress.  BP (!) 154/77   Pulse (!) 54   Temp 98.1 F (36.7 C) (Oral)   Resp 18   Ht 5' 2"  (1.575 m)   Wt 196 lb 12.8 oz (89.3 kg)   LMP 01/10/2000   SpO2 96%   BMI 36.00 kg/m   HENT:  Head: Normocephalic and atraumatic.  Right Ear: Hearing normal.  Left Ear: Hearing normal.  Eyes: Conjunctivae are normal. No scleral icterus.  Neck: Normal range of motion. Neck supple. No thyromegaly present.  Cardiovascular: Normal rate and regular rhythm.   Murmur heard.  Systolic murmur is present  Pulses:      Radial pulses are 2+ on the right side, and 2+ on the left side.       Dorsalis pedis pulses are 2+ on the right side, and 2+ on the left side.  Pulmonary/Chest: Effort normal and breath sounds normal.  Lymphadenopathy:  Head (right side): No tonsillar, no preauricular, no posterior auricular and no occipital adenopathy present.       Head (left side): No tonsillar, no preauricular, no posterior auricular and no occipital adenopathy present.    She has no cervical adenopathy.       Right: No supraclavicular adenopathy present.       Left: No supraclavicular adenopathy present.  Neurological: She is alert and oriented to person, place, and time. No sensory deficit.  Skin: Skin is warm, dry and intact. No rash noted. There is erythema (patches of erythema and dry skin under both eyes). No cyanosis. Nails show no clubbing.  Psychiatric: She has a normal mood and affect. Her speech is normal and behavior is normal.           Assessment & Plan:   Problem List Items Addressed This Visit    Hyperlipidemia    Requested labs from recent visit  with Dr. Joelyn Oms. If lipids not done, will ask that they be done at her next lab draw.      Relevant Medications   spironolactone (ALDACTONE) 25 MG tablet   Essential hypertension    Remains above goal. Recent addition of spironolactone. Given the persistent episodes of near-syncope, recommend D/C atenolol. Will request Dr. Adin Hector advice for alternative regarding HTN management.      Relevant Medications   spironolactone (ALDACTONE) 25 MG tablet   DM (diabetes mellitus), type 2 with renal complications ([place]) - Primary    Requested labs from recent visit with Dr. Joelyn Oms. If A1C not done, will ask that it be done at her next lab draw.      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 Follow-up with Dr. Joelyn Oms.      Obesity (BMI 30-39.9)    COntinue efforts for healthy lifestyle changes.       Other Visit Diagnoses    Dermatitis       Trial of triamcinolone. Thin layer BID x 7-10 days.   Relevant Medications   triamcinolone (KENALOG) 0.025 % ointment   Dizziness       Possibly symptomatic bradycardia. Declines referral to cardiology. Try D/C atenolol (will request guidance from Dr. Joelyn Oms re: BP). If symptoms persist, refer.       Return in about 3 months (around 11/05/2016) for Wellness visit, re-evaluation of diabetes, blood pressure, etc.   Fara Chute, PA-C [facility]

## 2016-08-05 NOTE — Assessment & Plan Note (Signed)
COntinue efforts for healthy lifestyle changes.

## 2016-08-05 NOTE — Assessment & Plan Note (Signed)
Requested labs from recent visit with Dr. Marisue HumbleSanford. If lipids not done, will ask that they be done at her next lab draw.

## 2016-08-05 NOTE — Progress Notes (Signed)
Subjective:    Patient ID: Tracy Kennedy, female    DOB: September 07, 1950, 66 y.o.   MRN: [id number] Tracy Mons, PA-C  HPI Presenting for HTN and DM2 follow-up.  Feeling well. Saw Dr. Joelyn Kennedy on Friday and was instructed to d/c KCl and start spironolactone 12.55m every morning. Labs drawn, including UA and lipid panel. Takes BP every morning. Will take 2-3 times daily when it's high. Over the past month, systolics range 1546-270 535-00 Next follow-up appointment on July 11th. Pt c/o red spots under her eyes that she first noticed at the end of March. Tried aloe vera, which made it worse. Steroid creams helped but made her skin dry.  Pt continues to take her fasting blood sugar readings in the morning. Readings since 3/20 visit range 86-115. She attributes her elevated readings to high-sugar snacks the night before.  From visit on 10/16/2015: Sometimes just out of the blue, sudden onset of dizziness, lasting 4-5 seconds, then resolves. Isn't sure when it started-several months ago, after Tracy Kennedy's cancer diagnosis. Doesn't think it's stress related. Last episode was a couple of days ago when she was at her father's (he lives next door, and she is his primary care giver). Wonders if her BP is dropping. Has checked glucose when it happens and it was stable. Sometimes happens once, sometimes 4-5 times in a day. Not triggered by certain foods or eating in general. Usually sitting, either driving or talking. Not triggered by rapid position changes. Thought it was allergies, but she has no symptoms like rhinorrhea, stuffy nose, drainage, sore throat, ear buzzing or popping. Not drinking much water.  Today, she reports she is still having episodes where she feels like she may pass out, but these resolve after a few seconds and are becoming less frequent. She still takes her BP and blood sugar during these episodes, and these are WNL. Denies any associated triggers. Can happen at any time, even  while driving.   Patient Active Problem List   Diagnosis Date Noted  . Rhinitis 06/19/2015  . Obesity (BMI 30-39.9) 10/30/2013  . 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 DM (diabetes mellitus), type 2 with renal complications ([place]   . AORTIC VALVE DISORDERS 10/26/2008  . Hyperlipidemia 10/25/2008  . Essential hypertension 10/25/2008  . Asthma 10/25/2008  . GERD 10/25/2008  . MURMUR 10/25/2008  . CHEST PAIN 10/25/2008    Prior to Admission medications   Medication Sig Start Date End Date Taking? Authorizing Provider  ACETAMINOPHEN PO Take by mouth 2 (two) times daily.   Yes [provider]  albuterol (PROVENTIL HFA;VENTOLIN HFA) 108 (90 Base) MCG/ACT inhaler Inhale 2 puffs into the lungs every 6 (six) hours as needed. 01/15/16  Yes Kennedy, Chelle, PA-C  aspirin 81 MG tablet Take 81 mg by mouth daily.   Yes [provider]  atenolol-chlorthalidone (TENORETIC) 50-25 MG tablet Take 1 tablet by mouth daily. 06/19/15  Yes Kennedy, Chelle, PA-C  BIOTIN PO Take by mouth daily.   Yes [provider]  Blood Glucose Monitoring Suppl (ONETOUCH VERIO) W/DEVICE KIT 1 Device by Does not apply route AC breakfast. 12/12/14  Yes Kennedy, Chelle, PA-C  CALCIUM PO Take by mouth.   Yes [provider]  cetirizine (ZYRTEC) 10 MG tablet TAKE 1 TABLET(10 MG) BY MOUTH DAILY 04/04/16  Yes MJaynee Eagles PA-C  CINNAMON PO Take by mouth daily. Reported on 03/20/2015   Yes [provider]  clobetasol (TEMOVATE) 0.05 % external solution  12/11/14  Yes [provider]  Cyanocobalamin (VITAMIN B-12 PO) Take by mouth.   Yes [provider]  GLUCOSAMINE PO Take by mouth.   Yes [provider]  ipratropium (ATROVENT) 0.03 % nasal spray Place 2 sprays into both nostrils 2 (two) times daily. 06/19/15  Yes Tracy Mons, PA-C  Lancet Devices (LANCING DEVICE) MISC Use to test blood sugar daily. One box  10/20/12  Yes Kennedy, Chelle, PA-C  Lancets MISC Use to test blood sugar daily 10/11/12  Yes Kennedy, Chelle, PA-C  omeprazole (PRILOSEC) 20 MG capsule Take 1 capsule (20 mg total) by mouth daily. 06/19/15  Yes Kennedy, Domingo Mend, PA-C  ONETOUCH VERIO test strip TEST AS DIRECTED 05/02/16  Yes Kennedy, Chelle, PA-C  spironolactone (ALDACTONE) 25 MG tablet Take 25 mg by mouth daily.   Yes [provider]  valsartan (DIOVAN) 320 MG tablet Take 1 tablet (320 mg total) by mouth daily. 04/29/16  Yes Kennedy, Chelle, PA-C  verapamil (VERELAN PM) 360 MG 24 hr capsule TAKE ONE CAPSULE BY MOUTH EVERY DAY 04/16/16  Yes Tracy Mons, PA-C  WELCHOL 625 MG tablet TAKE 3 TABLETS BY MOUTH TWICE DAILY WITH A MEAL 07/02/16  Yes Kennedy, Chelle, PA-C  potassium chloride SA (K-DUR,KLOR-CON) 20 MEQ tablet TAKE 1 TABLET BY MOUTH DAILY Patient not taking: Reported on 08/05/2016 06/24/16   Tracy Mons, PA-C    Allergies  Allergen Reactions  . Codeine     REACTION: nausea  . Statins     Muscle pain     Review of Systems  Constitutional: Negative for appetite change, chills, fatigue and fever.  Eyes: Negative for visual disturbance.  Respiratory: Negative for apnea and shortness of breath.   Cardiovascular: Negative for chest pain, palpitations and leg swelling.  Gastrointestinal: Negative for abdominal pain, blood in stool, nausea and vomiting.  Endocrine: Negative for polydipsia, polyphagia and polyuria.  Genitourinary: Negative for hematuria.  Musculoskeletal: Negative for myalgias.  Neurological: Negative for syncope.       Objective:   Physical Exam  Constitutional: She appears well-developed and well-nourished.  BP (!) 154/77   Pulse (!) 54   Temp 98.1 F (36.7 C) (Oral)   Resp 18   Ht 5' 2" (1.575 m)   Wt 196 lb 12.8 oz (89.3 kg)   LMP 01/10/2000   SpO2 96%   BMI 36.00 kg/m    HENT:  Head: Normocephalic and atraumatic.  Right Ear: External ear normal.  Left Ear: External ear  normal.  Eyes: Conjunctivae and EOM are normal. Pupils are equal, round, and reactive to light.  Neck: Normal range of motion. Neck supple. Carotid bruit is not present.  Cardiovascular: Normal rate, regular rhythm, S1 normal and S2 normal.   Murmur heard.  Systolic murmur is present  Pulses:      Radial pulses are 2+ on the right side, and 2+ on the left side.       Dorsalis pedis pulses are 2+ on the right side, and 2+ on the left side.  Pulmonary/Chest: Effort normal and breath sounds normal.  Neurological: She is alert.  Skin: Skin is warm and dry.  Bilateral areas of erythema underneath patient's eyes. Skin is thin and dry.  Psychiatric: She has a normal mood and affect. Her behavior is normal.          Assessment & Plan:   1. Type 2 diabetes mellitus with other diabetic kidney complication, without long-term current use of insulin ([place]) Diabetes well controlled on current regimen.  Continue taking blood sugar readings daily.  2. 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 Hyperlipidemia, unspecified hyperlipidemia type Labs requested from Dr. Adin Hector office. Consider resuming Welchol  4. Essential hypertension Continue current regimen as prescribed to you by Dr. Joelyn Kennedy. Will discuss discontinuation of atenolol component of medication regimen as this may be the cause of her intermittent near-syncopal episodes.  5. Obesity (BMI 30-39.9) Encouraged continued healthy diet and regular exercise.  6. Dermatitis Instructed to apply a thin layer of steroid ointment twice daily for ten days over affected areas under any other skin products she uses.  Continue to moisturize the skin, apply broad-spectrum UV protection, and wear hats for additional sun protection. Discontinue any skin products that produce a burning sensation. - triamcinolone (KENALOG) 0.025 % ointment; Apply 1 application topically 2 (two) times daily.  Dispense:  30 g; Refill: 0  7. Dizziness Will discuss discontinuation of atenolol as this may be the cause of her intermittent near-syncopal episodes. Consider referral to cardiology if this does not help. Advised calling if these become more frequent or if she experiences a syncopal episode.

## 2016-08-13 ENCOUNTER — Encounter: Payer: Self-pay | Admitting: Physician Assistant

## 2016-08-19 ENCOUNTER — Other Ambulatory Visit: Payer: Self-pay | Admitting: Physician Assistant

## 2016-08-19 DIAGNOSIS — K219 Gastro-esophageal reflux disease without esophagitis: Secondary | ICD-10-CM

## 2016-08-20 ENCOUNTER — Other Ambulatory Visit: Payer: Self-pay | Admitting: Physician Assistant

## 2016-08-20 DIAGNOSIS — I10 Essential (primary) hypertension: Secondary | ICD-10-CM

## 2016-08-22 ENCOUNTER — Encounter: Payer: Self-pay | Admitting: Physician Assistant

## 2016-08-23 MED ORDER — CHLORTHALIDONE 25 MG PO TABS: 25.0000 mg | ORAL_TABLET | Freq: Every day | ORAL | 0 refills | Status: DC

## 2016-09-18 ENCOUNTER — Other Ambulatory Visit: Payer: Self-pay | Admitting: Physician Assistant

## 2016-10-02 ENCOUNTER — Other Ambulatory Visit: Payer: Self-pay | Admitting: Urgent Care

## 2016-10-02 DIAGNOSIS — J302 Other seasonal allergic rhinitis: Secondary | ICD-10-CM

## 2016-10-02 DIAGNOSIS — H6592 Unspecified nonsuppurative otitis media, left ear: Secondary | ICD-10-CM

## 2016-10-11 ENCOUNTER — Other Ambulatory Visit: Payer: Self-pay | Admitting: Physician Assistant

## 2016-10-16 ENCOUNTER — Other Ambulatory Visit: Payer: Self-pay | Admitting: Physician Assistant

## 2016-11-07 ENCOUNTER — Encounter: Payer: BC Managed Care – PPO | Admitting: Physician Assistant

## 2016-11-07 ENCOUNTER — Other Ambulatory Visit: Payer: Self-pay | Admitting: Physician Assistant

## 2016-11-12 ENCOUNTER — Other Ambulatory Visit: Payer: Self-pay | Admitting: Physician Assistant

## 2016-11-12 DIAGNOSIS — K219 Gastro-esophageal reflux disease without esophagitis: Secondary | ICD-10-CM

## 2016-11-18 ENCOUNTER — Ambulatory Visit: Payer: BC Managed Care – PPO | Admitting: Physician Assistant

## 2016-11-25 ENCOUNTER — Encounter: Payer: BC Managed Care – PPO | Admitting: Physician Assistant

## 2016-12-02 ENCOUNTER — Ambulatory Visit (INDEPENDENT_AMBULATORY_CARE_PROVIDER_SITE_OTHER): Payer: BC Managed Care – PPO | Admitting: Physician Assistant

## 2016-12-02 ENCOUNTER — Encounter: Payer: Self-pay | Admitting: Physician Assistant

## 2016-12-02 VITALS — BP 140/80 | HR 78 | Temp 98.1°F | Resp 18 | Ht 62.0 in | Wt 192.8 lb

## 2016-12-02 DIAGNOSIS — L309 Dermatitis, unspecified: Secondary | ICD-10-CM

## 2016-12-02 DIAGNOSIS — I10 Essential (primary) hypertension: Secondary | ICD-10-CM

## 2016-12-02 DIAGNOSIS — E1129 Type 2 diabetes mellitus with other diabetic kidney complication: Secondary | ICD-10-CM | POA: Diagnosis not present

## 2016-12-02 DIAGNOSIS — R809 Proteinuria, unspecified: Secondary | ICD-10-CM

## 2016-12-02 DIAGNOSIS — Z124 Encounter for screening for malignant neoplasm of cervix: Secondary | ICD-10-CM | POA: Diagnosis not present

## 2016-12-02 DIAGNOSIS — E785 Hyperlipidemia, unspecified: Secondary | ICD-10-CM | POA: Diagnosis not present

## 2016-12-02 DIAGNOSIS — Z0000 Encounter for general adult medical examination without abnormal findings: Secondary | ICD-10-CM

## 2016-12-02 DIAGNOSIS — K219 Gastro-esophageal reflux disease without esophagitis: Secondary | ICD-10-CM

## 2016-12-02 MED ORDER — CHLORTHALIDONE 25 MG PO TABS: ORAL_TABLET | ORAL | 3 refills | Status: AC

## 2016-12-02 MED ORDER — CLOTRIMAZOLE-BETAMETHASONE 1-0.05 % EX CREA: 1.0000 "application " | TOPICAL_CREAM | Freq: Two times a day (BID) | CUTANEOUS | 0 refills | Status: AC

## 2016-12-02 MED ORDER — OMEPRAZOLE 20 MG PO CPDR: DELAYED_RELEASE_CAPSULE | ORAL | 3 refills | Status: AC

## 2016-12-02 NOTE — Patient Instructions (Addendum)
   IF you received an x-ray today, you will receive an invoice from [facility]. Please contact [facility] at [phone] with questions or concerns regarding your invoice.   IF you received labwork today, you will receive an invoice from [facility]. Please contact [facility] at [phone] with questions or concerns regarding your invoice.   Our billing staff will not be able to assist you with questions regarding bills from these companies.  You will be contacted with the lab results as soon as they are available. The fastest way to get your results is to activate your My Chart account. Instructions are located on the last page of this paperwork. If you have not heard from us regarding the results in 2 weeks, please contact this office.     Preventive Care 40-64 Years, Female Preventive care refers to lifestyle choices and visits with your health care provider that can promote health and wellness. What does preventive care include?  A yearly physical exam. This is also called an annual well check.  Dental exams once or twice a year.  Routine eye exams. Ask your health care provider how often you should have your eyes checked.  Personal lifestyle choices, including: ? Daily care of your teeth and gums. ? Regular physical activity. ? Eating a healthy diet. ? Avoiding tobacco and drug use. ? Limiting alcohol use. ? Practicing safe sex. ? Taking low-dose aspirin daily starting at age 50. ? Taking vitamin and mineral supplements as recommended by your health care provider. What happens during an annual well check? The services and screenings done by your health care provider during your annual well check will depend on your age, overall health, lifestyle risk factors, and family history of disease. Counseling Your health care provider may ask you questions about your:  Alcohol use.  Tobacco use.  Drug use.  Emotional well-being.  Home and relationship  well-being.  Sexual activity.  Eating habits.  Work and work environment.  Method of birth control.  Menstrual cycle.  Pregnancy history.  Screening You may have the following tests or measurements:  Height, weight, and BMI.  Blood pressure.  Lipid and cholesterol levels. These may be checked every 5 years, or more frequently if you are over 50 years old.  Skin check.  Lung cancer screening. You may have this screening every year starting at age 55 if you have a 30-pack-year history of smoking and currently smoke or have quit within the past 15 years.  Fecal occult blood test (FOBT) of the stool. You may have this test every year starting at age 50.  Flexible sigmoidoscopy or colonoscopy. You may have a sigmoidoscopy every 5 years or a colonoscopy every 10 years starting at age 50.  Hepatitis C blood test.  Hepatitis B blood test.  Sexually transmitted disease (STD) testing.  Diabetes screening. This is done by checking your blood sugar (glucose) after you have not eaten for a while (fasting). You may have this done every 1-3 years.  Mammogram. This may be done every 1-2 years. Talk to your health care provider about when you should start having regular mammograms. This may depend on whether you have a family history of breast cancer.  BRCA-related cancer screening. This may be done if you have a family history of breast, ovarian, tubal, or peritoneal cancers.  Pelvic exam and Pap test. This may be done every 3 years starting at age 21. Starting at age 30, this may be done every 5 years if you   have a Pap test in combination with an HPV test.  Bone density scan. This is done to screen for osteoporosis. You may have this scan if you are at high risk for osteoporosis.  Discuss your test results, treatment options, and if necessary, the need for more tests with your health care provider. Vaccines Your health care provider may recommend certain vaccines, such  as:  Influenza vaccine. This is recommended every year.  Tetanus, diphtheria, and acellular pertussis (Tdap, Td) vaccine. You may need a Td booster every 10 years.  Varicella vaccine. You may need this if you have not been vaccinated.  Zoster vaccine. You may need this after age 60.  Measles, mumps, and rubella (MMR) vaccine. You may need at least one dose of MMR if you were born in 1957 or later. You may also need a second dose.  Pneumococcal 13-valent conjugate (PCV13) vaccine. You may need this if you have certain conditions and were not previously vaccinated.  Pneumococcal polysaccharide (PPSV23) vaccine. You may need one or two doses if you smoke cigarettes or if you have certain conditions.  Meningococcal vaccine. You may need this if you have certain conditions.  Hepatitis A vaccine. You may need this if you have certain conditions or if you travel or work in places where you may be exposed to hepatitis A.  Hepatitis B vaccine. You may need this if you have certain conditions or if you travel or work in places where you may be exposed to hepatitis B.  Haemophilus influenzae type b (Hib) vaccine. You may need this if you have certain conditions.  Talk to your health care provider about which screenings and vaccines you need and how often you need them. This information is not intended to replace advice given to you by your health care provider. Make sure you discuss any questions you have with your health care provider. Document Released: 02/23/2015 Document Revised: 10/17/2015 Document Reviewed: 11/28/2014 Elsevier Interactive Patient Education  2017 [employer]  

## 2016-12-02 NOTE — Progress Notes (Signed)
Patient ID: Tracy Kennedy Howald, female    DOB: [birth year], 66 y.o.   MRN: [id number]  PCP: [name], PA-C  Chief Complaint  Patient presents with  . Annual Exam    Needs PAP  . Medication Refill    Hygroton 25 MG, omeprazole 20 MG    Subjective:   Presents for [employer] [employer] Exam and evaluation of diabetes, HTN and hyperlipidemia.  Diabetes has been well controlled (A1C <6% x 1 year), and verapamil dose recently reduced. Lipids were well controlled a year ago, but at last visit, following d/c statin, LDL rose to 178.   Cervical Cancer Screening: last pap was several years ago, history of abnormal pap in her early 20's that required repeat, but no additional treatment. Not sexually active with men in 3 decades. Breast Cancer Screening: normal mammogram in 01/2016. Repeat 01/2018, sooner if desired. Colorectal Cancer Screening: Normal colonoscopy 2011. Bone Density Testing: nit yet HIV Screening: 06/2014 NEGATIVE STI Screening: very low risk Seasonal Influenza Vaccination: current Td/Tdap Vaccination: current, 2014 Pneumococcal Vaccination: booster Pneumovax and dose of Prevnar-13 at age 89. Zoster Vaccination: not yet Frequency of Dental evaluation: 6 months Frequency of Eye evaluation: annually    Review of Systems  Constitutional: Negative.   HENT: Negative.   Eyes: Negative.   Respiratory: Negative.   Cardiovascular: Negative.   Gastrointestinal: Negative.   Endocrine: Negative.   Genitourinary: Negative for decreased urine volume, difficulty urinating, dyspareunia, dysuria, enuresis, flank pain, frequency, genital sores, hematuria, menstrual problem, pelvic pain, urgency, vaginal bleeding, vaginal discharge and vaginal pain.       Burning sensation of the perineum with urination and defecation  Musculoskeletal: Negative.   Skin: Negative.   Allergic/Immunologic: Negative.   Neurological: Negative.   Hematological: Negative.   Psychiatric/Behavioral:  Negative.        Patient Active Problem List   Diagnosis Date Noted  . Rhinitis 06/19/2015  . Obesity (BMI 30-39.9) 10/30/2013  . 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 DM (diabetes mellitus), type 2 with renal complications ([place])   . AORTIC VALVE DISORDERS 10/26/2008  . Hyperlipidemia 10/25/2008  . Essential hypertension 10/25/2008  . Asthma 10/25/2008  . GERD 10/25/2008  . MURMUR 10/25/2008  . CHEST PAIN 10/25/2008     Prior to Admission medications   Medication Sig Start Date End Date Taking? Authorizing Provider  ACETAMINOPHEN PO Take by mouth 2 (two) times daily.   Yes [provider]  albuterol (PROVENTIL HFA;VENTOLIN HFA) 108 (90 Base) MCG/ACT inhaler Inhale 2 puffs into the lungs every 6 (six) hours as needed. 01/15/16  Yes Yago Ludvigsen, PA-C  aspirin 81 MG tablet Take 81 mg by mouth daily.   Yes [provider]  BIOTIN PO Take by mouth daily.   Yes [provider]  Blood Glucose Monitoring Suppl (ONETOUCH VERIO) W/DEVICE KIT 1 Device by Does not apply route AC breakfast. 12/12/14  Yes Meri Pelot, PA-C  CALCIUM PO Take by mouth.   Yes [provider]  cetirizine (ZYRTEC) 10 MG tablet TAKE 1 TABLET(10 MG) BY MOUTH DAILY 10/03/16  Yes Nahjae Hoeg, PA-C  chlorthalidone (HYGROTON) 25 MG tablet TAKE 1 TABLET(25 MG) BY MOUTH DAILY 11/10/16  Yes Chara Marquard, PA-C  CINNAMON PO Take by mouth daily. Reported on 03/20/2015   Yes [provider]  clobetasol (TEMOVATE) 0.05 % external solution  12/11/14  Yes [provider]  Cyanocobalamin (VITAMIN B-12 PO) Take by mouth.   Yes [provider]  GLUCOSAMINE PO Take by mouth.   Yes [provider]  ipratropium (ATROVENT) 0.03 % nasal spray Place 2 sprays into both nostrils 2 (two) times daily. 06/19/15  Yes [name], PA-C  Lancet Devices (LANCING DEVICE) MISC Use to test blood sugar daily. One box  10/20/12  Yes Breylan Lefevers, PA-C  Lancets MISC Use to test blood sugar daily 10/11/12  Yes Benjamin Merrihew, PA-C  omeprazole (PRILOSEC) 20 MG capsule TAKE 1 CAPSULE(20 MG) BY MOUTH DAILY 11/12/16  Yes Brilynn Biasi, [place], PA-C  ONETOUCH VERIO test strip TEST AS DIRECTED 10/17/16  Yes Kenzleigh Sedam, PA-C  spironolactone (ALDACTONE) 25 MG tablet Take 0.5 tablets (12.5 mg total) by mouth daily. 08/14/16  Yes Marshae Azam, PA-C  triamcinolone (KENALOG) 0.025 % ointment Apply 1 application topically 2 (two) times daily. 08/05/16  Yes Marketia Stallsmith, PA-C  valsartan (DIOVAN) 320 MG tablet Take 1 tablet (320 mg total) by mouth daily. 04/29/16  Yes Kaushal Vannice, PA-C  verapamil (VERELAN PM) 180 MG 24 hr capsule Take 180 mg by mouth daily. 11/12/16  Yes [provider]  WELCHOL 625 MG tablet TAKE 3 TABLETS BY MOUTH TWICE DAILY WITH A MEAL 07/02/16  Yes Quint Chestnut, PA-C     Allergies  Allergen Reactions  . Codeine     REACTION: nausea  . Statins     Muscle pain       Objective:  Physical Exam  Constitutional: She is oriented to person, place, and time. Vital signs are normal. She appears well-developed and well-nourished. She is active and cooperative. No distress.  BP 140/80 (BP Location: Left Arm, Patient Position: Sitting, Cuff Size: Large)   Pulse 78   Temp 98.1 F (36.7 C) (Oral)   Resp 18   Ht 5' 2"  (1.575 m)   Wt 192 lb 12.8 oz (87.5 kg)   LMP 01/10/2000   SpO2 97%   BMI 35.26 kg/m    HENT:  Head: Normocephalic and atraumatic.  Right Ear: Hearing, tympanic membrane, external ear and ear canal normal. No foreign bodies.  Left Ear: Hearing, tympanic membrane, external ear and ear canal normal. No foreign bodies.  Nose: Nose normal.  Mouth/Throat: Uvula is midline, oropharynx is clear and moist and mucous membranes are normal. No oral lesions. Normal dentition. No dental abscesses or uvula swelling. No oropharyngeal exudate.  Eyes: Pupils are equal, round, and reactive  to light. Conjunctivae, EOM and lids are normal. Right eye exhibits no discharge. Left eye exhibits no discharge. No scleral icterus.  Fundoscopic exam:      The right eye shows no arteriolar narrowing, no AV nicking, no exudate, no hemorrhage and no papilledema.       The left eye shows no arteriolar narrowing, no AV nicking, no exudate, no hemorrhage and no papilledema.  Neck: Trachea normal, normal range of motion and full passive range of motion without pain. Neck supple. No spinous process tenderness and no muscular tenderness present. No thyroid mass and no thyromegaly present.  Cardiovascular: Normal rate, regular rhythm, normal heart sounds, intact distal pulses and normal pulses.   Pulmonary/Chest: Effort normal and breath sounds normal. She exhibits no tenderness and no retraction. Right breast exhibits no inverted nipple, no mass, no nipple discharge, no skin change and no tenderness. Left breast exhibits no inverted nipple, no mass, no nipple discharge, no skin change and no tenderness. Breasts are symmetrical.  Abdominal: Soft. Normal appearance and bowel sounds are normal. She exhibits no distension and no mass. There  is no hepatosplenomegaly. There is no tenderness. There is no rigidity, no rebound, no guarding, no CVA tenderness, no tenderness at McBurney's point and negative Murphy's sign. No hernia. Hernia confirmed negative in the right inguinal area and confirmed negative in the left inguinal area.  Genitourinary: Rectum normal and uterus normal. Rectal exam shows no external hemorrhoid and no fissure.    No breast swelling, tenderness, discharge or bleeding. Pelvic exam was performed with patient supine. No labial fusion. There is no rash, tenderness, lesion or injury on the right labia. There is no rash, tenderness, lesion or injury on the left labia. Cervix exhibits no motion tenderness, no discharge and no friability. Right adnexum displays no mass, no tenderness and no fullness.  Left adnexum displays no mass, no tenderness and no fullness. No erythema, tenderness or bleeding in the vagina. No foreign body in the vagina. No signs of injury around the vagina. No vaginal discharge found.  Genitourinary Comments: Introitus is narrowed, making the speculum exam extremely uncomfortable/painful for the patient and difficult for the clinician.   Musculoskeletal: She exhibits no edema or tenderness.       Cervical back: Normal.       Thoracic back: Normal.       Lumbar back: Normal.  Lymphadenopathy:       Head (right side): No tonsillar, no preauricular, no posterior auricular and no occipital adenopathy present.       Head (left side): No tonsillar, no preauricular, no posterior auricular and no occipital adenopathy present.    She has no cervical adenopathy.    She has no axillary adenopathy.       Right: No inguinal and no supraclavicular adenopathy present.       Left: No inguinal and no supraclavicular adenopathy present.  Neurological: She is alert and oriented to person, place, and time. She has normal strength and normal reflexes. No cranial nerve deficit. She exhibits normal muscle tone. Coordination and gait normal.  Skin: Skin is warm, dry and intact. No rash noted. She is not diaphoretic. No cyanosis or erythema. Nails show no clubbing.  Psychiatric: She has a normal mood and affect. Her speech is normal and behavior is normal. Judgment and thought content normal.       Wt Readings from Last 3 Encounters:  12/02/16 192 lb 12.8 oz (87.5 kg)  08/05/16 196 lb 12.8 oz (89.3 kg)  04/29/16 195 lb (88.5 kg)       Assessment & Plan:   Problem List Items Addressed This Visit    Hyperlipidemia    Await labs. Adjust regimen as indicated by results. Stopped statin due to muscle pain. Currently on Welchol.      Relevant Medications   verapamil (VERELAN PM) 180 MG 24 hr capsule   chlorthalidone (HYGROTON) 25 MG tablet   Other Relevant Orders   Comprehensive  metabolic panel (Completed)   Lipid panel (Completed)   Essential hypertension    Borderline control today. Recent reduction in verapamil from 360 to 180. Continue to monitor.      Relevant Medications   verapamil (VERELAN PM) 180 MG 24 hr capsule   chlorthalidone (HYGROTON) 25 MG tablet   Other Relevant Orders   CBC with Differential/Platelet (Completed)   Comprehensive metabolic panel (Completed)   TSH (Completed)   Urinalysis, dipstick only (Completed)   GERD    Controlled on current treatment with omeprazole.      Relevant Medications   omeprazole (PRILOSEC) 20 MG capsule   DM (diabetes  mellitus), type 2 with renal complications ([place])    Has been controlled. Anticipate it remains so. Await labs. Adjust regimen as indicated by results.      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 Follow-up per nephrology.      Relevant Orders   Microalbumin / creatinine urine ratio (Completed)    Other Visit Diagnoses    Annual physical exam    -  Primary   Age appropriate health guidance provided.   Screening for cervical cancer       if cytology and HPV both negative, would end this screening.   Relevant Orders   Pap IG and HPV (high risk) DNA detection (Completed)   Dermatitis of vulva       Raises suspiscion of loss of diabetes control, but home readings suggest otherwise.    Relevant Medications   clotrimazole-betamethasone (LOTRISONE) cream       Return in about 3 months (around 03/04/2017) for re-evaluation of diabetes, etc.   Fara Chute, PA-C [facility]

## 2016-12-02 NOTE — Progress Notes (Signed)
Subjective:    Patient ID: Tracy Kennedy, female    DOB: October 29, 1950, 66 y.o.   MRN: [id number]  HPI  Ms. Tracy Kennedy is a 66 year old Caucasian female with a past medical history significant for hyperlipidemia, hypertension, asthma, GERD, type 2 diabetes mellitus, and allergic rhinitis who presents today for annual physical exam.   Ms. Tracy Kennedy is also complaining of burning sensations with bowel movements and urination. She reports her symptoms stared 4 days ago. Ms. Tracy Kennedy denies pain with bowel movements or voids. She Feels like the skin is burning. She denies changes in urinary frequency and hematuria. Ms. Tracy Kennedy reports she recently is having 3 bowel movements per day. She usually has 2 bowel movements per day, but believes she is going more frequently, because she has been eating out more over the past 2 weeks. Ms. Tracy Kennedy denies hematochezia, nausea or vomiting, as well as changes in appetite.     Ms. Tracy Kennedy reports she is compliant with all medications. She also states she is tolerating all of her medications well without any side effects.   1. Hypertension:  Ms. Tracy Kennedy is checking her blood pressure at home. She reports her blood pressure ranges are 120-144/74-88. Ms. Tracy Kennedy states she has had some sinus headaches, but no headaches out of the ordinary. She denies chest pain. Ms. Tracy Kennedy reports she is has some dyspnea while walking fast on the trail. She denies orthopnea and PND. She states she has some disturbed sleep during to urinary frequency. Ms. Tracy Kennedy denies peripheral edema.   2. Type 2 Diabetes:  Ms. Tracy Kennedy states she checks her blood glucose levels every day before breakfast. She states her blood glucose level ranges from 91-112. Ms. Tracy Kennedy denies vision changes. She has her next optometry appointment scheduled for December 10th. Ms. Tracy Kennedy denies dizziness and changes to sensation to hands or feet. She also states she performs feet checks daily.    Ms. Tracy Kennedy states she eats a protein, veggies and fruit for  breakfast and lunch. She then always has a protein, carbohydrate, and veggies for dinner. She occasionally has a glass of wine. She denies use of tobacco products and illicit drugs. She walks 3-4 times per week.  3. Allergic Rhinitis:  Ms. Tracy Kennedy reports she is taking Zyrtec daily. She still has some congestion in the morning. She is concerned about long-term use.     4. Asthma: Ms. Tracy Kennedy states her asthma is usually well-controlled. The only she has trouble is going up hill at cold weather or long distance at fast pace.   5. GERD:  Ms. Tracy Kennedy reports her GERD is currently well-controlled on Omeprazole.   Medications: Prior to Admission medications   Medication Sig Start Date End Date Taking? Authorizing Provider  ACETAMINOPHEN PO Take by mouth 2 (two) times daily.   Yes [provider]  albuterol (PROVENTIL HFA;VENTOLIN HFA) 108 (90 Base) MCG/ACT inhaler Inhale 2 puffs into the lungs every 6 (six) hours as needed. 01/15/16  Yes Jeffery, [place], PA-C  aspirin 81 MG tablet Take 81 mg by mouth daily.   Yes [provider]  BIOTIN PO Take by mouth daily.   Yes [provider]  Blood Glucose Monitoring Suppl (ONETOUCH VERIO) W/DEVICE KIT 1 Device by Does not apply route AC breakfast. 12/12/14  Yes Jeffery, [place], PA-C  CALCIUM PO Take by mouth.   Yes [provider]  cetirizine (ZYRTEC) 10 MG tablet TAKE 1 TABLET(10 MG) BY MOUTH DAILY 10/03/16  Yes [name], PA-C  chlorthalidone (HYGROTON) 25 MG tablet TAKE 1 TABLET(25 MG) BY MOUTH DAILY 12/02/16  Yes Jeffery, [place], PA-C  CINNAMON PO Take by mouth daily. Reported on 03/20/2015   Yes [provider]  clobetasol (TEMOVATE) 0.05 % external solution  12/11/14  Yes [provider]  Cyanocobalamin (VITAMIN B-12 PO) Take by mouth.   Yes [provider]  GLUCOSAMINE PO Take by mouth.   Yes [provider]  ipratropium (ATROVENT) 0.03 % nasal spray Place 2 sprays into both nostrils  2 (two) times daily. 06/19/15  Yes [name], PA-C  Lancet Devices (LANCING DEVICE) MISC Use to test blood sugar daily. One box 10/20/12  Yes Jeffery, [place], PA-C  Lancets MISC Use to test blood sugar daily 10/11/12  Yes Jeffery, [place], PA-C  omeprazole (PRILOSEC) 20 MG capsule TAKE 1 CAPSULE(20 MG) BY MOUTH DAILY 12/02/16  Yes Jeffery, [place], PA-C  ONETOUCH VERIO test strip TEST AS DIRECTED 10/17/16  Yes Jeffery, [place], PA-C  spironolactone (ALDACTONE) 25 MG tablet Take 0.5 tablets (12.5 mg total) by mouth daily. 08/14/16  Yes Jeffery, [place], PA-C  triamcinolone (KENALOG) 0.025 % ointment Apply 1 application topically 2 (two) times daily. 08/05/16  Yes Jeffery, [place], PA-C  valsartan (DIOVAN) 320 MG tablet Take 1 tablet (320 mg total) by mouth daily. 04/29/16  Yes Jeffery, [place], PA-C  verapamil (VERELAN PM) 180 MG 24 hr capsule Take 180 mg by mouth daily. 11/12/16  Yes [provider]  WELCHOL 625 MG tablet TAKE 3 TABLETS BY MOUTH TWICE DAILY WITH A MEAL 07/02/16  Yes Jeffery, [place], PA-C  clotrimazole-betamethasone (LOTRISONE) cream Apply 1 application topically 2 (two) times daily. 12/02/16   [name], PA-C   Allergies:  Codeine (reaction: nausea)  Statins (reaction: muscle pain)   Chronic Medical Conditions: Patient Active Problem List   Diagnosis Date Noted  . Rhinitis 06/19/2015  . Obesity (BMI 30-39.9) 10/30/2013  . 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 DM (diabetes mellitus), type 2 with renal complications ([place])   . AORTIC VALVE DISORDERS 10/26/2008  . Hyperlipidemia 10/25/2008  . Essential hypertension 10/25/2008  . Asthma 10/25/2008  . GERD 10/25/2008  . MURMUR 10/25/2008  . CHEST PAIN 10/25/2008   GYN history:  Last pap smear was over 5-6 years ago  History of abnormal pap smear in early 20s   Surgical History:  Past Surgical History:  Procedure Laterality Date  . BREAST LUMPECTOMY  10/2009    Benign  . BUNIONECTOMY  07/2010, 08/2011  . 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 EYE SURGERY  1999   Lasix  . EYE SURGERY  2013   Cataract, lens replacement bilaterally  . FRACTURE SURGERY  1994   ankle  . WISDOM TOOTH EXTRACTION     Review of Systems     Objective:   Physical Exam  Constitutional: She appears well-developed and well-nourished. She is cooperative.  BP 140/80 (BP Location: Left Arm, Patient Position: Sitting, Cuff Size: Large)   Pulse 78   Temp 98.1 F (36.7 C) (Oral)   Resp 18   Ht 5' 2"  (1.575 m)   Wt 192 lb 12.8 oz (87.5 kg)   LMP 01/10/2000   SpO2 97%   BMI 35.26 kg/m    HENT:  Head: Normocephalic and atraumatic.  Right Ear: Tympanic membrane, external ear and ear canal normal.  Left Ear: Tympanic membrane, external ear and ear canal normal.  Mouth/Throat: No oral lesions. Normal dentition.  Eyes:  Pupils are equal, round, and reactive to light. Conjunctivae and EOM are normal.  Neck: Normal range of motion. Neck supple. No thyromegaly present.  Cardiovascular: Normal rate, regular rhythm and intact distal pulses.   Murmur heard.  Systolic murmur is present with a grade of 2/6  Pulses:      Radial pulses are 2+ on the right side, and 2+ on the left side.       Dorsalis pedis pulses are 2+ on the right side, and 2+ on the left side.       Posterior tibial pulses are 2+ on the right side, and 2+ on the left side.  Pulmonary/Chest: Effort normal and breath sounds normal. She has no decreased breath sounds. She has no wheezes.  Abdominal: Soft. Bowel sounds are normal. There is no tenderness.  Genitourinary: No breast swelling, tenderness or discharge.  Genitourinary Comments: Erythematous rash around labia majora  Musculoskeletal: Normal range of motion.  Strength 5/5 in all extremities  Neurological: She is alert. She exhibits normal muscle tone.  Skin: Skin is warm and dry. No rash noted. No erythema.      Assessment & Plan:  1. Annual  physical exam and health maintenance - Pap smear obtained today in clinic   2. Type 2 diabetes mellitus with other diabetic kidney complication, without long-term current use of insulin ([facility]) - Hemoglobin A1c obtained today in clinic - CMP obtained today in clinic  - Microalbumin / creatinine urine ratio obtained today in clinic - Continue Welchol 1864m BID with meals  - Encouraged patient to continue to check her blood glucose at home. Also encouraged patient to continue to eat fruits, vegetables, lean proteins, and low sodium diet as well as get at least 30 minutes of exercise 5 times per week. - Return to clinic in 3 months for diabetes follow-up   3. Essential hypertension - CBC with diff obtained today in clinic - TSH obtained today in clinic  - UA obtained today in clinic  - Continue Chlorthalidone 264mdaily - Continue Verapamil 18069maily  - Continue Valsartan 320m49mily - Encouraged patient to continue to check her blood pressure at home. Also encouraged patient to continue to eat fruits, vegetables, lean proteins, and low sodium diet as well as get at least 30 minutes of exercise 5 times per week. - Return to clinic in 3 months of hypertension follow-up  4. Hyperlipidemia, unspecified hyperlipidemia type - Lipid panel obtained today in clinic - Return to clinic in 3 months for hyperlipidemia follow-up   5. Gastroesophageal reflux disease without esophagitis - Continue Omeprazole 20mg55mly - Encouraged patient to continue to avoid spicy foods or other foods that worsens her reflux.   6. Dermatitis of vulva - Begin applying Clotrimazole-betamethasone cream BID  - Return to clinic if rash worsens or does not improve  Brianne Maina, PA-S

## 2016-12-03 LAB — COMPREHENSIVE METABOLIC PANEL
ALBUMIN: 4.3 g/dL (ref 3.6–4.8)
ALT: 21 IU/L (ref 0–32)
AST: 20 IU/L (ref 0–40)
Albumin/Globulin Ratio: 1.4 (ref 1.2–2.2)
Alkaline Phosphatase: 68 IU/L (ref 39–117)
BUN / CREAT RATIO: 34 — AB (ref 12–28)
BUN: 16 mg/dL (ref 8–27)
Bilirubin Total: <0.2 mg/dL (ref 0.0–1.2)
CO2: 24 mmol/L (ref 20–29)
Calcium: 10.1 mg/dL (ref 8.7–10.3)
Chloride: 103 mmol/L (ref 96–106)
Creatinine, Ser: 0.47 mg/dL — ABNORMAL LOW (ref 0.57–1.00)
GFR calc Af Amer: 121 mL/min/{1.73_m2} (ref >59)
GFR calc non Af Amer: 105 mL/min/{1.73_m2} (ref >59)
GLUCOSE: 92 mg/dL (ref 65–99)
Globulin, Total: 3 g/dL (ref 1.5–4.5)
Potassium: 4.3 mmol/L (ref 3.5–5.2)
Sodium: 143 mmol/L (ref 134–144)
TOTAL PROTEIN: 7.3 g/dL (ref 6.0–8.5)

## 2016-12-03 LAB — CBC WITH DIFFERENTIAL/PLATELET
BASOS ABS: 0 10*3/uL (ref 0.0–0.2)
Basos: 0 %
EOS (ABSOLUTE): 0.1 10*3/uL (ref 0.0–0.4)
Eos: 1 %
HEMOGLOBIN: 12.7 g/dL (ref 11.1–15.9)
Hematocrit: 39.2 % (ref 34.0–46.6)
IMMATURE GRANS (ABS): 0 10*3/uL (ref 0.0–0.1)
Immature Granulocytes: 0 %
LYMPHS: 29 %
Lymphocytes Absolute: 2.4 10*3/uL (ref 0.7–3.1)
MCH: 28.7 pg (ref 26.6–33.0)
MCHC: 32.4 g/dL (ref 31.5–35.7)
MCV: 89 fL (ref 79–97)
MONOCYTES: 8 %
Monocytes Absolute: 0.7 10*3/uL (ref 0.1–0.9)
NEUTROS ABS: 5.2 10*3/uL (ref 1.4–7.0)
Neutrophils: 62 %
Platelets: 320 10*3/uL (ref 150–379)
RBC: 4.42 x10E6/uL (ref 3.77–5.28)
RDW: 14.2 % (ref 12.3–15.4)
WBC: 8.4 10*3/uL (ref 3.4–10.8)

## 2016-12-03 LAB — URINALYSIS, DIPSTICK ONLY
Bilirubin, UA: NEGATIVE
Glucose, UA: NEGATIVE
KETONES UA: NEGATIVE
Leukocytes, UA: NEGATIVE
NITRITE UA: NEGATIVE
Protein, UA: NEGATIVE
RBC, UA: NEGATIVE
Specific Gravity, UA: 1.026 (ref 1.005–1.030)
Urobilinogen, Ur: 0.2 mg/dL (ref 0.2–1.0)
pH, UA: 5.5 (ref 5.0–7.5)

## 2016-12-03 LAB — LIPID PANEL
CHOL/HDL RATIO: 3.6 ratio (ref 0.0–4.4)
Cholesterol, Total: 200 mg/dL — ABNORMAL HIGH (ref 100–199)
HDL: 56 mg/dL (ref >39)
LDL CALC: 120 mg/dL — AB (ref 0–99)
TRIGLYCERIDES: 120 mg/dL (ref 0–149)
VLDL Cholesterol Cal: 24 mg/dL (ref 5–40)

## 2016-12-03 LAB — MICROALBUMIN / CREATININE URINE RATIO
Creatinine, Urine: 86.1 mg/dL
MICROALB/CREAT RATIO: 39 mg/g{creat} — AB (ref 0.0–30.0)
Microalbumin, Urine: 33.6 ug/mL

## 2016-12-03 LAB — HEMOGLOBIN A1C
Est. average glucose Bld gHb Est-mCnc: 128 mg/dL
HEMOGLOBIN A1C: 6.1 % — AB (ref 4.8–5.6)

## 2016-12-03 LAB — TSH: TSH: 0.967 u[IU]/mL (ref 0.450–4.500)

## 2016-12-04 LAB — PAP IG AND HPV HIGH-RISK
HPV, high-risk: NEGATIVE
PAP Smear Comment: 0

## 2016-12-07 NOTE — Assessment & Plan Note (Signed)
Has been controlled. Anticipate it remains so. Await labs. Adjust regimen as indicated by results. 

## 2016-12-07 NOTE — Assessment & Plan Note (Signed)
Await labs. Adjust regimen as indicated by results. Stopped statin due to muscle pain. Currently on Welchol.

## 2016-12-07 NOTE — Assessment & Plan Note (Signed)
Borderline control today. Recent reduction in verapamil from 360 to 180. Continue to monitor.

## 2016-12-07 NOTE — Assessment & Plan Note (Signed)
COntinue to manage diabetes and HTN. Follow-up per nephrology.

## 2016-12-07 NOTE — Assessment & Plan Note (Signed)
Controlled on current treatment with omeprazole.

## 2017-01-27 ENCOUNTER — Other Ambulatory Visit: Payer: Self-pay | Admitting: Physician Assistant

## 2017-01-27 DIAGNOSIS — Z1231 Encounter for screening mammogram for malignant neoplasm of breast: Secondary | ICD-10-CM

## 2017-01-28 ENCOUNTER — Other Ambulatory Visit: Payer: Self-pay | Admitting: Physician Assistant

## 2017-01-28 DIAGNOSIS — I10 Essential (primary) hypertension: Secondary | ICD-10-CM

## 2017-02-25 ENCOUNTER — Ambulatory Visit
Admission: RE | Admit: 2017-02-25 | Discharge: 2017-02-25 | Disposition: A | Discharge status: Home / Self Care | Payer: BC Managed Care – PPO | Source: Ambulatory Visit | Attending: Physician Assistant | Admitting: Physician Assistant

## 2017-02-25 DIAGNOSIS — Z1231 Encounter for screening mammogram for malignant neoplasm of breast: Secondary | ICD-10-CM

## 2017-03-10 ENCOUNTER — Encounter: Payer: Self-pay | Admitting: Physician Assistant

## 2017-03-10 ENCOUNTER — Other Ambulatory Visit: Payer: Self-pay

## 2017-03-10 ENCOUNTER — Ambulatory Visit: Payer: BC Managed Care – PPO | Admitting: Physician Assistant

## 2017-03-10 VITALS — BP 138/80 | HR 71 | Temp 99.0°F | Resp 18 | Ht 62.0 in | Wt 195.2 lb

## 2017-03-10 DIAGNOSIS — J309 Allergic rhinitis, unspecified: Secondary | ICD-10-CM | POA: Diagnosis not present

## 2017-03-10 DIAGNOSIS — I10 Essential (primary) hypertension: Secondary | ICD-10-CM

## 2017-03-10 DIAGNOSIS — E1129 Type 2 diabetes mellitus with other diabetic kidney complication: Secondary | ICD-10-CM

## 2017-03-10 DIAGNOSIS — E785 Hyperlipidemia, unspecified: Secondary | ICD-10-CM | POA: Diagnosis not present

## 2017-03-10 DIAGNOSIS — R809 Proteinuria, unspecified: Secondary | ICD-10-CM | POA: Diagnosis not present

## 2017-03-10 DIAGNOSIS — E669 Obesity, unspecified: Secondary | ICD-10-CM | POA: Diagnosis not present

## 2017-03-10 NOTE — Assessment & Plan Note (Signed)
More congested today than usual. Continue cetirizine and PRN Atrovent NS. Not interested in adding Montelukast at this time.

## 2017-03-10 NOTE — Assessment & Plan Note (Signed)
Anticipate a rise in A1C, given recent eating habits and reduced exercise. Resume healthier eating and exercise.

## 2017-03-10 NOTE — Assessment & Plan Note (Signed)
Well controlled. Continue current treatment. 

## 2017-03-10 NOTE — Assessment & Plan Note (Signed)
Slight increase in weight from last visit. Recommit to healthy eating and regular exercise.

## 2017-03-10 NOTE — Progress Notes (Signed)
Patient ID: Tracy Kennedy, female    DOB: 02-23-1950, 67 y.o.   MRN: [id number]  PCP: [name], PA-C  Chief Complaint  Patient presents with  . Diabetes  . Hypertension  . Hyperlipidemia  . Follow-up    3 month follow up    Subjective:   Presents for evaluation of Diabetes, HTN and hyperlipidemia.  Her father died 10 days ago. He lived next door, and she provided care. His health had been in decline significantly since Christmas, so this wasn't unexpected. He was 93. In his obituary, she included her wife's name and identified Helene Kelp as her wife. She has been astounded by the support and congratulations received from her family and church. Her wife is now in remission from multiple myeloma. Has joined the [employer].  Allergies persist, uses cetirizine daily. Doesn't tolerate nasal sprays well-uses Atrovent NS intermittently when she develops ear fullness. Doesn't want to add another medicine now.  Has been taking Welchol regularly, but hasn't been making as good eating choices as previously. Home BP has been <135/84.    Review of Systems  Constitutional: Negative for activity change, appetite change, fatigue and unexpected weight change.  HENT: Negative for congestion, dental problem, ear pain, hearing loss, mouth sores, postnasal drip, rhinorrhea, sneezing, sore throat, tinnitus and trouble swallowing.   Eyes: Negative for photophobia, pain, redness and visual disturbance.  Respiratory: Negative for cough, chest tightness and shortness of breath.   Cardiovascular: Negative for chest pain, palpitations and leg swelling.  Gastrointestinal: Negative for abdominal pain, blood in stool, constipation, diarrhea, nausea and vomiting.  Endocrine: Negative for cold intolerance, heat intolerance, polydipsia, polyphagia and polyuria.  Genitourinary: Negative for dysuria, frequency, hematuria and urgency.  Musculoskeletal: Negative for arthralgias, gait  problem, myalgias and neck stiffness.  Skin: Negative for rash.  Neurological: Negative for dizziness, speech difficulty, weakness, light-headedness, numbness and headaches.  Hematological: Negative for adenopathy.  Psychiatric/Behavioral: Negative for confusion and sleep disturbance. The patient is not nervous/anxious.        Patient Active Problem List   Diagnosis Date Noted  . Rhinitis 06/19/2015  . Obesity (BMI 30-39.9) 10/30/2013  . 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 DM (diabetes mellitus), type 2 with renal complications ([place])   . AORTIC VALVE DISORDERS 10/26/2008  . Hyperlipidemia 10/25/2008  . Essential hypertension 10/25/2008  . Asthma 10/25/2008  . GERD 10/25/2008  . MURMUR 10/25/2008  . CHEST PAIN 10/25/2008     Prior to Admission medications   Medication Sig Start Date End Date Taking? Authorizing Provider  ACETAMINOPHEN PO Take by mouth 2 (two) times daily.   Yes [provider]  albuterol (PROVENTIL HFA;VENTOLIN HFA) 108 (90 Base) MCG/ACT inhaler Inhale 2 puffs into the lungs every 6 (six) hours as needed. 01/15/16  Yes Mohamad Bruso, PA-C  aspirin 81 MG tablet Take 81 mg by mouth daily.   Yes [provider]  BIOTIN PO Take by mouth daily.   Yes [provider]  Blood Glucose Monitoring Suppl (ONETOUCH VERIO) W/DEVICE KIT 1 Device by Does not apply route AC breakfast. 12/12/14  Yes Pamela Maddy, PA-C  CALCIUM PO Take by mouth.   Yes [provider]  cetirizine (ZYRTEC) 10 MG tablet TAKE 1 TABLET(10 MG) BY MOUTH DAILY 10/03/16  Yes Deby Adger, PA-C  chlorthalidone (HYGROTON) 25 MG tablet TAKE 1 TABLET(25 MG) BY MOUTH DAILY 12/02/16  Yes Kaden Dunkel, PA-C  CINNAMON PO Take by mouth  daily. Reported on 03/20/2015   Yes [provider]  clobetasol (TEMOVATE) 0.05 % external solution  12/11/14  Yes [provider]  clotrimazole-betamethasone (LOTRISONE)  cream Apply 1 application topically 2 (two) times daily. 12/02/16  Yes Shyah Cadmus, PA-C  Cyanocobalamin (VITAMIN B-12 PO) Take by mouth.   Yes [provider]  GLUCOSAMINE PO Take by mouth.   Yes [provider]  ipratropium (ATROVENT) 0.03 % nasal spray Place 2 sprays into both nostrils 2 (two) times daily. 06/19/15  Yes [name], PA-C  Lancet Devices (LANCING DEVICE) MISC Use to test blood sugar daily. One box 10/20/12  Yes Loyd Salvador, PA-C  Lancets MISC Use to test blood sugar daily 10/11/12  Yes Eithen Castiglia, PA-C  omeprazole (PRILOSEC) 20 MG capsule TAKE 1 CAPSULE(20 MG) BY MOUTH DAILY 12/02/16  Yes Sallyanne Birkhead, [place], PA-C  ONETOUCH VERIO test strip TEST AS DIRECTED 10/17/16  Yes Tamar Lipscomb, PA-C  spironolactone (ALDACTONE) 25 MG tablet Take 0.5 tablets (12.5 mg total) by mouth daily. 08/14/16  Yes [name] Centola, PA-C  triamcinolone (KENALOG) 0.025 % ointment Apply 1 application topically 2 (two) times daily. 08/05/16  Yes Naomii Kreger, PA-C  valsartan (DIOVAN) 320 MG tablet Take 1 tablet (320 mg total) by mouth daily. 04/29/16  Yes Rodman Recupero, PA-C  verapamil (VERELAN PM) 180 MG 24 hr capsule Take 180 mg by mouth daily. 11/12/16  Yes [provider]  WELCHOL 625 MG tablet TAKE 3 TABLETS BY MOUTH TWICE DAILY WITH A MEAL 07/02/16  Yes Elridge Stemm, PA-C     Allergies  Allergen Reactions  . Codeine     REACTION: nausea  . Statins     Muscle pain       Objective:  Physical Exam  Constitutional: She is oriented to person, place, and time. She appears well-developed and well-nourished. No distress.  BP 138/80 (BP Location: Left Arm, Patient Position: Sitting, Cuff Size: Large)   Pulse 71   Temp 99 F (37.2 C) (Oral)   Resp 18   Ht _0  (1.575 m)   Wt 195 lb 3.2 oz (88.5 kg)   LMP 01/10/2000   SpO2 95%   BMI 35.70 kg/m    Eyes: Conjunctivae are normal. No scleral icterus.  Neck: No thyromegaly present.  Cardiovascular: Normal  rate, regular rhythm and intact distal pulses.  Murmur heard.  Systolic murmur is present with a grade of 2/6. Pulmonary/Chest: Effort normal and breath sounds normal.  Lymphadenopathy:    She has no cervical adenopathy.  Neurological: She is alert and oriented to person, place, and time.  Skin: Skin is warm and dry.  Psychiatric: She has a normal mood and affect. Her speech is normal and behavior is normal.    Wt Readings from Last 3 Encounters:  03/10/17 195 lb 3.2 oz (88.5 kg)  12/02/16 192 lb 12.8 oz (87.5 kg)  08/05/16 196 lb 12.8 oz (89.3 kg)      Assessment & Plan:   Problem List Items Addressed This Visit    Hyperlipidemia    Await labs. Adjust regimen as indicated by results. Taking Welchol regularly now, so anticipate improvement. Goal LDL <70.      Relevant Orders   Comprehensive metabolic panel   Lipid panel   Essential hypertension    Well controlled. Continue current treatment.      Relevant Orders   CBC with Differential/Platelet   Comprehensive metabolic panel   DM (diabetes mellitus), type 2 with renal complications ([place]) - Primary  Anticipate a rise in A1C, given recent eating habits and reduced exercise. Resume healthier eating and exercise.      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 Adjust regimen as indicated by results.      Relevant Orders   Comprehensive metabolic panel   Obesity (BMI 30-39.9)    Slight increase in weight from last visit. Recommit to healthy eating and regular exercise.      Rhinitis    More congested today than usual. Continue cetirizine and PRN Atrovent NS. Not interested in adding Montelukast at this time.          Return in about 3 months (around 06/08/2017) for re-evaluation of diabetes, HTN, cholesterol.   Fara Chute, PA-C [facility] [place]

## 2017-03-10 NOTE — Assessment & Plan Note (Signed)
Await labs. Adjust regimen as indicated by results. Taking Welchol regularly now, so anticipate improvement. Goal LDL <70.

## 2017-03-10 NOTE — Patient Instructions (Addendum)
Get back in to your good eating and exercise habits!    IF you received an x-ray today, you will receive an invoice from [facility]. Please contact [facility] at [phone] with questions or concerns regarding your invoice.   IF you received labwork today, you will receive an invoice from [facility]. Please contact [facility] at [phone] with questions or concerns regarding your invoice.   Our billing staff will not be able to assist you with questions regarding bills from these companies.  You will be contacted with the lab results as soon as they are available. The fastest way to get your results is to activate your My Chart account. Instructions are located on the last page of this paperwork. If you have not heard from us regarding the results in 2 weeks, please contact this office.

## 2017-03-10 NOTE — Assessment & Plan Note (Signed)
Await labs. Adjust regimen as indicated by results.  

## 2017-03-11 LAB — CBC WITH DIFFERENTIAL/PLATELET
BASOS ABS: 0 10*3/uL (ref 0.0–0.2)
Basos: 0 %
EOS (ABSOLUTE): 0.1 10*3/uL (ref 0.0–0.4)
Eos: 1 %
Hematocrit: 39.3 % (ref 34.0–46.6)
Hemoglobin: 12.8 g/dL (ref 11.1–15.9)
IMMATURE GRANS (ABS): 0 10*3/uL (ref 0.0–0.1)
Immature Granulocytes: 0 %
LYMPHS: 26 %
Lymphocytes Absolute: 2 10*3/uL (ref 0.7–3.1)
MCH: 28.5 pg (ref 26.6–33.0)
MCHC: 32.6 g/dL (ref 31.5–35.7)
MCV: 88 fL (ref 79–97)
Monocytes Absolute: 0.8 10*3/uL (ref 0.1–0.9)
Monocytes: 10 %
NEUTROS ABS: 4.8 10*3/uL (ref 1.4–7.0)
Neutrophils: 63 %
PLATELETS: 309 10*3/uL (ref 150–379)
RBC: 4.49 x10E6/uL (ref 3.77–5.28)
RDW: 14.1 % (ref 12.3–15.4)
WBC: 7.7 10*3/uL (ref 3.4–10.8)

## 2017-03-11 LAB — COMPREHENSIVE METABOLIC PANEL
ALK PHOS: 69 IU/L (ref 39–117)
ALT: 26 IU/L (ref 0–32)
AST: 16 IU/L (ref 0–40)
Albumin/Globulin Ratio: 1.4 (ref 1.2–2.2)
Albumin: 4.2 g/dL (ref 3.6–4.8)
BUN/Creatinine Ratio: 29 — ABNORMAL HIGH (ref 12–28)
BUN: 17 mg/dL (ref 8–27)
Bilirubin Total: 0.3 mg/dL (ref 0.0–1.2)
CHLORIDE: 102 mmol/L (ref 96–106)
CO2: 25 mmol/L (ref 20–29)
Calcium: 10 mg/dL (ref 8.7–10.3)
Creatinine, Ser: 0.58 mg/dL (ref 0.57–1.00)
GFR calc Af Amer: 113 mL/min/{1.73_m2} (ref >59)
GFR calc non Af Amer: 98 mL/min/{1.73_m2} (ref >59)
GLUCOSE: 115 mg/dL — AB (ref 65–99)
Globulin, Total: 3 g/dL (ref 1.5–4.5)
Potassium: 4.1 mmol/L (ref 3.5–5.2)
Sodium: 141 mmol/L (ref 134–144)
Total Protein: 7.2 g/dL (ref 6.0–8.5)

## 2017-03-11 LAB — LIPID PANEL
CHOLESTEROL TOTAL: 226 mg/dL — AB (ref 100–199)
Chol/HDL Ratio: 4.3 ratio (ref 0.0–4.4)
HDL: 53 mg/dL (ref >39)
LDL Calculated: 139 mg/dL — ABNORMAL HIGH (ref 0–99)
TRIGLYCERIDES: 168 mg/dL — AB (ref 0–149)
VLDL Cholesterol Cal: 34 mg/dL (ref 5–40)

## 2017-03-11 LAB — HEMOGLOBIN A1C
ESTIMATED AVERAGE GLUCOSE: 126 mg/dL
HEMOGLOBIN A1C: 6 % — AB (ref 4.8–5.6)

## 2017-04-21 ENCOUNTER — Telehealth: Payer: BC Managed Care – PPO | Admitting: Family

## 2017-04-21 DIAGNOSIS — R05 Cough: Secondary | ICD-10-CM

## 2017-04-21 DIAGNOSIS — R059 Cough, unspecified: Secondary | ICD-10-CM

## 2017-04-21 MED ORDER — PREDNISONE 10 MG (21) PO TBPK: ORAL_TABLET | ORAL | 0 refills | Status: DC

## 2017-04-21 NOTE — Progress Notes (Signed)

## 2017-05-04 LAB — HM DIABETES EYE EXAM

## 2017-05-18 ENCOUNTER — Encounter: Payer: Self-pay | Admitting: Physician Assistant

## 2017-06-09 ENCOUNTER — Ambulatory Visit: Payer: BC Managed Care – PPO | Admitting: Physician Assistant

## 2017-06-09 ENCOUNTER — Encounter: Payer: Self-pay | Admitting: Physician Assistant

## 2017-06-09 ENCOUNTER — Other Ambulatory Visit: Payer: Self-pay

## 2017-06-09 VITALS — BP 138/80 | HR 74 | Temp 98.8°F | Resp 16 | Ht 62.0 in | Wt 194.8 lb

## 2017-06-09 DIAGNOSIS — E785 Hyperlipidemia, unspecified: Secondary | ICD-10-CM

## 2017-06-09 DIAGNOSIS — Z7189 Other specified counseling: Secondary | ICD-10-CM | POA: Diagnosis not present

## 2017-06-09 DIAGNOSIS — E1129 Type 2 diabetes mellitus with other diabetic kidney complication: Secondary | ICD-10-CM | POA: Diagnosis not present

## 2017-06-09 DIAGNOSIS — Z7184 Encounter for health counseling related to travel: Secondary | ICD-10-CM

## 2017-06-09 DIAGNOSIS — I10 Essential (primary) hypertension: Secondary | ICD-10-CM | POA: Diagnosis not present

## 2017-06-09 LAB — HEMOGLOBIN A1C
Est. average glucose Bld gHb Est-mCnc: 126 mg/dL
Hgb A1c MFr Bld: 6 % — ABNORMAL HIGH (ref 4.8–5.6)

## 2017-06-09 LAB — COMPREHENSIVE METABOLIC PANEL
ALBUMIN: 4.2 g/dL (ref 3.6–4.8)
ALK PHOS: 58 IU/L (ref 39–117)
ALT: 29 IU/L (ref 0–32)
AST: 21 IU/L (ref 0–40)
Albumin/Globulin Ratio: 1.4 (ref 1.2–2.2)
BUN / CREAT RATIO: 32 — AB (ref 12–28)
BUN: 17 mg/dL (ref 8–27)
Bilirubin Total: <0.2 mg/dL (ref 0.0–1.2)
CO2: 29 mmol/L (ref 20–29)
CREATININE: 0.53 mg/dL — AB (ref 0.57–1.00)
Calcium: 10.2 mg/dL (ref 8.7–10.3)
Chloride: 104 mmol/L (ref 96–106)
GFR, EST AFRICAN AMERICAN: 116 mL/min/{1.73_m2} (ref >59)
GFR, EST NON AFRICAN AMERICAN: 101 mL/min/{1.73_m2} (ref >59)
GLOBULIN, TOTAL: 3 g/dL (ref 1.5–4.5)
Glucose: 109 mg/dL — ABNORMAL HIGH (ref 65–99)
Potassium: 4.1 mmol/L (ref 3.5–5.2)
SODIUM: 142 mmol/L (ref 134–144)
Total Protein: 7.2 g/dL (ref 6.0–8.5)

## 2017-06-09 LAB — CBC WITH DIFFERENTIAL/PLATELET
Basophils Absolute: 0.1 10*3/uL (ref 0.0–0.2)
Basos: 1 %
EOS (ABSOLUTE): 0.1 10*3/uL (ref 0.0–0.4)
EOS: 1 %
HEMATOCRIT: 39.2 % (ref 34.0–46.6)
HEMOGLOBIN: 12.8 g/dL (ref 11.1–15.9)
Immature Grans (Abs): 0 10*3/uL (ref 0.0–0.1)
Immature Granulocytes: 0 %
LYMPHS ABS: 2.3 10*3/uL (ref 0.7–3.1)
Lymphs: 32 %
MCH: 29.1 pg (ref 26.6–33.0)
MCHC: 32.7 g/dL (ref 31.5–35.7)
MCV: 89 fL (ref 79–97)
MONOCYTES: 12 %
Monocytes Absolute: 0.8 10*3/uL (ref 0.1–0.9)
Neutrophils Absolute: 3.9 10*3/uL (ref 1.4–7.0)
Neutrophils: 54 %
Platelets: 294 10*3/uL (ref 150–379)
RBC: 4.4 x10E6/uL (ref 3.77–5.28)
RDW: 14.7 % (ref 12.3–15.4)
WBC: 7.2 10*3/uL (ref 3.4–10.8)

## 2017-06-09 LAB — LIPID PANEL
CHOLESTEROL TOTAL: 190 mg/dL (ref 100–199)
Chol/HDL Ratio: 4 ratio (ref 0.0–4.4)
HDL: 48 mg/dL (ref >39)
LDL CALC: 116 mg/dL — AB (ref 0–99)
TRIGLYCERIDES: 128 mg/dL (ref 0–149)
VLDL CHOLESTEROL CAL: 26 mg/dL (ref 5–40)

## 2017-06-09 MED ORDER — SCOPOLAMINE 1 MG/3DAYS TD PT72: 1.0000 | MEDICATED_PATCH | TRANSDERMAL | 0 refills | Status: DC

## 2017-06-09 NOTE — Patient Instructions (Addendum)
Do it NOW. Don't wait.  Enjoy the [employer]!    IF you received an x-ray today, you will receive an invoice from [facility]. Please contact [facility] at [phone] with questions or concerns regarding your invoice.   IF you received labwork today, you will receive an invoice from [facility]. Please contact [facility] at [phone] with questions or concerns regarding your invoice.   Our billing staff will not be able to assist you with questions regarding bills from these companies.  You will be contacted with the lab results as soon as they are available. The fastest way to get your results is to activate your My Chart account. Instructions are located on the last page of this paperwork. If you have not heard from [place] regarding the results in 2 weeks, please contact this office.

## 2017-06-09 NOTE — Progress Notes (Signed)
Patient ID: Tracy Kennedy, female    DOB: 1950/06/01, 67 y.o.   MRN: [id number]  PCP: [name], PA-C  Chief Complaint  Patient presents with  . Hyperlipidemia    follow up   . Diabetes    follow up   . Hypertension    follow up     Subjective:   Presents for evaluation of diabetes, HTN and hyperlipidemia.  Has not lost much weight over the past 3 months. The stress level in her life is dramatically improved. Since her father's death, she isn't cooking as nutritiously, and is eating out more. Stopped cetirizine. Was feeling sluggish in the mornings. Much improved in the mornings.  Saw her dentist a few weeks ago. Gums are much improved prom previously, teeth in good shape. Saw her eye specialist. No retinopathy. Improved from previous visits.  Tolerating medications well. Home BP 120's/70's. Home glucose, fasting, 110's-120's   Review of Systems  Constitutional: Negative for activity change, appetite change, fatigue and unexpected weight change.  HENT: Negative for congestion, dental problem, ear pain, hearing loss, mouth sores, postnasal drip, rhinorrhea, sneezing, sore throat, tinnitus and trouble swallowing.   Eyes: Negative for photophobia, pain, redness and visual disturbance.  Respiratory: Negative for cough, chest tightness and shortness of breath.   Cardiovascular: Negative for chest pain, palpitations and leg swelling.  Gastrointestinal: Negative for abdominal pain, blood in stool, constipation, diarrhea, nausea and vomiting.  Endocrine: Negative for cold intolerance, heat intolerance, polydipsia, polyphagia and polyuria.  Genitourinary: Negative for dysuria, frequency, hematuria and urgency.  Musculoskeletal: Negative for arthralgias, gait problem, myalgias and neck stiffness.  Skin: Negative for rash.  Neurological: Negative for dizziness, speech difficulty, weakness, light-headedness, numbness and headaches.  Hematological: Negative for adenopathy.    Psychiatric/Behavioral: Negative for confusion and sleep disturbance. The patient is not nervous/anxious.        Patient Active Problem List   Diagnosis Date Noted  . Rhinitis 06/19/2015  . Obesity (BMI 30-39.9) 10/30/2013  . 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 DM (diabetes mellitus), type 2 with renal complications ([place])   . AORTIC VALVE DISORDERS 10/26/2008  . Hyperlipidemia 10/25/2008  . Essential hypertension 10/25/2008  . Asthma 10/25/2008  . GERD 10/25/2008  . MURMUR 10/25/2008  . CHEST PAIN 10/25/2008     Prior to Admission medications   Medication Sig Start Date Authorizing Provider  ACETAMINOPHEN PO Take by mouth 2 (two) times daily.  [provider]  albuterol (PROVENTIL HFA;VENTOLIN HFA) 108 (90 Base) MCG/ACT inhaler Inhale 2 puffs into the lungs every 6 (six) hours as needed. 01/15/16 [name], PA-C  aspirin 81 MG tablet Take 81 mg by mouth daily.  [provider]  BIOTIN PO Take by mouth daily.  [provider]  Blood Glucose Monitoring Suppl (ONETOUCH VERIO) W/DEVICE KIT 1 Device by Does not apply route AC breakfast. 12/12/14 [name], PA-C  CALCIUM PO Take by mouth.  [provider]  cetirizine (ZYRTEC) 10 MG tablet TAKE 1 TABLET(10 MG) BY MOUTH DAILY 10/03/16 [name], PA-C  chlorthalidone (HYGROTON) 25 MG tablet TAKE 1 TABLET(25 MG) BY MOUTH DAILY 12/02/16 [name], PA-C  clobetasol (TEMOVATE) 0.05 % external solution  12/11/14 [provider]  clotrimazole-betamethasone (LOTRISONE) cream Apply 1 application topically 2 (two) times daily. 12/02/16 Neeta Storey, PA-C  Cyanocobalamin (VITAMIN B-12 PO) Take by mouth.  [provider]  GLUCOSAMINE PO Take by mouth.  [provider]  ipratropium (ATROVENT) 0.03 %  nasal spray Place 2 sprays into both nostrils 2 (two) times daily. 06/19/15 [name], PA-C  Lancet Devices  (LANCING DEVICE) MISC Use to test blood sugar daily. One box 10/20/12 [name], PA-C  Lancets MISC Use to test blood sugar daily 10/11/12 [name], PA-C  omeprazole (PRILOSEC) 20 MG capsule TAKE 1 CAPSULE(20 MG) BY MOUTH DAILY 12/02/16 [name], PA-C  ONETOUCH VERIO test strip TEST AS DIRECTED 10/17/16 [name], PA-C  spironolactone (ALDACTONE) 25 MG tablet Take 0.5 tablets (12.5 mg total) by mouth daily. 08/14/16 [name], PA-C  triamcinolone (KENALOG) 0.025 % ointment Apply 1 application topically 2 (two) times daily. 08/05/16 [name], PA-C  valsartan (DIOVAN) 320 MG tablet Take 1 tablet (320 mg total) by mouth daily. 04/29/16 [name], PA-C  verapamil (VERELAN PM) 180 MG 24 hr capsule Take 180 mg by mouth daily. 11/12/16 [provider]  WELCHOL 625 MG tablet TAKE 3 TABLETS BY MOUTH TWICE DAILY WITH A MEAL 07/02/16 [name], PA-C     Allergies  Allergen Reactions  . Codeine     REACTION: nausea  . Statins     Muscle pain       Objective:  Physical Exam  Constitutional: She is oriented to person, place, and time. She appears well-developed and well-nourished. She is active and cooperative. No distress.  BP 138/80   Pulse 74   Temp 98.8 F (37.1 C)   Resp 16   Ht '5\' 2"'$  (1.575 m)   Wt 194 lb 12.8 oz (88.4 kg)   LMP 01/10/2000   SpO2 97%   BMI 35.63 kg/m   HENT:  Head: Normocephalic and atraumatic.  Right Ear: Hearing normal.  Left Ear: Hearing normal.  Eyes: Conjunctivae are normal. No scleral icterus.  Neck: Normal range of motion. Neck supple. No thyromegaly present.  Cardiovascular: Normal rate and regular rhythm. Exam reveals no gallop and no friction rub.  Murmur heard. Pulses:      Radial pulses are 2+ on the right side, and 2+ on the left side.  Pulmonary/Chest: Effort normal and breath sounds normal.  Lymphadenopathy:       Head (right side): No tonsillar, no preauricular, no posterior auricular and no  occipital adenopathy present.       Head (left side): No tonsillar, no preauricular, no posterior auricular and no occipital adenopathy present.    She has no cervical adenopathy.       Right: No supraclavicular adenopathy present.       Left: No supraclavicular adenopathy present.  Neurological: She is alert and oriented to person, place, and time. No sensory deficit.  Skin: Skin is warm, dry and intact. No rash noted. No cyanosis or erythema. Nails show no clubbing.  Psychiatric: She has a normal mood and affect. Her speech is normal and behavior is normal.    Wt Readings from Last 3 Encounters:  06/09/17 194 lb 12.8 oz (88.4 kg)  03/10/17 195 lb 3.2 oz (88.5 kg)  12/02/16 192 lb 12.8 oz (87.5 kg)   Diabetic Foot Exam - Simple   Simple Foot Form Diabetic Foot exam was performed with the following findings:  Yes 06/09/2017  9:01 AM  Visual Inspection No deformities, no ulcerations, no other skin breakdown bilaterally:  Yes Sensation Testing Intact to touch and monofilament testing bilaterally:  Yes Pulse Check Posterior Tibialis and Dorsalis pulse intact bilaterally:  Yes Comments        Assessment & Plan:   Problem List Items Addressed This Visit  Hyperlipidemia    Await lab results. Goal LDL <70.      Relevant Orders   Lipid panel (Completed)   Comprehensive metabolic panel (Completed)   Essential hypertension    COntrolled. Continue current treatment. Healthy lifestyle changes.      Relevant Orders   CBC with Differential/Platelet (Completed)   DM (diabetes mellitus), type 2 with renal complications ([place]) - Primary    Await lab results. Continue current treatment and lifestyle changes.      Relevant Orders   Hemoglobin A1c (Completed)   Comprehensive metabolic panel (Completed)    Other Visit Diagnoses    Travel advice encounter       Relevant Medications   scopolamine (TRANSDERM-SCOP) 1 MG/3DAYS       Return in about 3 months (around 09/08/2017) for  re-evalaution of diabetes, blood pressure, cholesterol.   Fara Chute, PA-C [facility] [place]

## 2017-06-11 ENCOUNTER — Encounter: Payer: Self-pay | Admitting: *Deleted

## 2017-06-18 ENCOUNTER — Telehealth: Payer: BC Managed Care – PPO | Admitting: Physician Assistant

## 2017-06-18 DIAGNOSIS — H9209 Otalgia, unspecified ear: Secondary | ICD-10-CM

## 2017-06-18 NOTE — Progress Notes (Signed)
Based on what you shared with me it looks like you have a condition that should be evaluated in a face to face office visit. Part of the issue does seem consistent with what we call Eustachian tube dysfunction where the tube (normally regulates pressure within the ear) stays shut and causes buildup of fluid. This can cause ear pressure, popping, pain or dizziness. It can also lead to you developing an infection of the middle ear. I would definitely recommend that you continue your nasal spray and restart the zyrtec but you need an assessment in person to make sure there is not an infection present and to potentially receive a steroid shot to help open the eustachian tube and calm sinus inflammation to make your flight easier for you.   NOTE: If you entered your credit card information for this eVisit, you will not be charged. You may see a "hold" on your card for the $30 but that hold will drop off and you will not have a charge processed.  If you are having a true medical emergency please call 911.  If you need an urgent face to face visit, [facility] has four urgent care centers for your convenience.  If you need care fast and have a high deductible or no insurance consider:   [email or web address] to reserve your spot online an avoid wait times  [facility] [address] [place], [place] [address] 8 am to 8 pm Monday-Friday 10 am to 4 pm Saturday-Sunday *Across the street from [employer]  [address] [place], [address] 8 am to 5 pm Monday-Friday * In the [facility] on the [facility]   The following sites will take your  insurance:  . [facility]  [phone] Get Driving Directions Find a Provider at this Location  [address] [place], [place] [address] . 10 am to 8 pm Monday-Friday . 12 pm to 8 pm Saturday-Sunday   . [facility] at [facility]  [phone] Get Driving Directions Find a Provider at this Location  [address] [place], [place] [address] . 8 am to 8 pm Monday-Friday . 9 am to 6 pm Saturday . 11 am to 6 pm Sunday   . [facility] at [facility]  [phone] Get Driving Directions  [address] [place], [place] [address] . 8 am to 8 pm Monday-Friday . 8 am to 4 pm Saturday-Sunday   Your e-visit answers were reviewed by a board certified advanced clinical practitioner to complete your personal care plan.  Thank you for using e-Visits.

## 2017-06-19 ENCOUNTER — Other Ambulatory Visit: Payer: Self-pay

## 2017-06-19 ENCOUNTER — Encounter: Payer: Self-pay | Admitting: Physician Assistant

## 2017-06-19 ENCOUNTER — Ambulatory Visit: Payer: BC Managed Care – PPO | Admitting: Physician Assistant

## 2017-06-19 VITALS — BP 124/68 | HR 78 | Temp 98.7°F | Resp 16 | Ht 62.0 in | Wt 195.2 lb

## 2017-06-19 DIAGNOSIS — H65191 Other acute nonsuppurative otitis media, right ear: Secondary | ICD-10-CM | POA: Diagnosis not present

## 2017-06-19 DIAGNOSIS — J3089 Other allergic rhinitis: Secondary | ICD-10-CM

## 2017-06-19 MED ORDER — AMOXICILLIN 875 MG PO TABS: 1750.0000 mg | ORAL_TABLET | Freq: Two times a day (BID) | ORAL | 0 refills | Status: AC

## 2017-06-19 MED ORDER — IPRATROPIUM BROMIDE 0.03 % NA SOLN: 2.0000 | Freq: Two times a day (BID) | NASAL | 12 refills | Status: AC

## 2017-06-19 NOTE — Patient Instructions (Addendum)
Use Afrin for the flights. On the longer flights, you may need to use a second dose before the start of descent.  HAVE AND AWESOME TIME!  IF you received an x-ray today, you will receive an invoice from [facility]. Please contact [facility] at [phone] with questions or concerns regarding your invoice.   IF you received labwork today, you will receive an invoice from [facility]. Please contact [facility] at [phone] with questions or concerns regarding your invoice.   Our billing staff will not be able to assist you with questions regarding bills from these companies.  You will be contacted with the lab results as soon as they are available. The fastest way to get your results is to activate your My Chart account. Instructions are located on the last page of this paperwork. If you have not heard from [place] regarding the results in 2 weeks, please contact this office.

## 2017-06-19 NOTE — Progress Notes (Signed)
Patient ID: Tracy Kennedy, female    DOB: [birth year], 67 y.o.   MRN: [id number]  PCP: [name], PA-C  Chief Complaint  Patient presents with  . Cerumen Impaction    right ear clogged x 2 days     Subjective:   Presents for evaluation of clogged RIGHT ear.  Has been using azelastine NS, with relief, but recurrence, and this episode present x 2 days. Concerned that she is traveling to [place] in 2 days. Took a dose of cetirizine this morning. Really dries her out, so she doesn't like using it.  No ear pain. No nasal congestion, runny nose, sore throat. No cough. No fever/chills.  Review of Systems As above.    Patient Active Problem List   Diagnosis Date Noted  . Rhinitis 06/19/2015  . Obesity (BMI 30-39.9) 10/30/2013  . 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 DM (diabetes mellitus), type 2 with renal complications ([place])   . AORTIC VALVE DISORDERS 10/26/2008  . Hyperlipidemia 10/25/2008  . Essential hypertension 10/25/2008  . Asthma 10/25/2008  . GERD 10/25/2008  . MURMUR 10/25/2008     Prior to Admission medications   Medication Sig Start Date End Date Taking? Authorizing Provider  ACETAMINOPHEN PO Take by mouth 2 (two) times daily.   Yes [provider]  albuterol (PROVENTIL HFA;VENTOLIN HFA) 108 (90 Base) MCG/ACT inhaler Inhale 2 puffs into the lungs every 6 (six) hours as needed. 01/15/16  Yes Byrant Valent, PA-C  aspirin 81 MG tablet Take 81 mg by mouth daily.   Yes [provider]  BIOTIN PO Take by mouth daily.   Yes [provider]  Blood Glucose Monitoring Suppl (ONETOUCH VERIO) W/DEVICE KIT 1 Device by Does not apply route AC breakfast. 12/12/14  Yes Zaheer Wageman, PA-C  CALCIUM PO Take by mouth.   Yes [provider]  cetirizine (ZYRTEC) 10 MG tablet TAKE 1 TABLET(10 MG) BY MOUTH DAILY 10/03/16  Yes Renelda Kilian, PA-C  chlorthalidone (HYGROTON) 25 MG tablet  TAKE 1 TABLET(25 MG) BY MOUTH DAILY 12/02/16  Yes Chinmay Squier, PA-C  clobetasol (TEMOVATE) 0.05 % external solution  12/11/14  Yes [provider]  clotrimazole-betamethasone (LOTRISONE) cream Apply 1 application topically 2 (two) times daily. 12/02/16  Yes Apollos Tenbrink, PA-C  Cyanocobalamin (VITAMIN B-12 PO) Take by mouth.   Yes [provider]  GLUCOSAMINE PO Take by mouth.   Yes [provider]  ipratropium (ATROVENT) 0.03 % nasal spray Place 2 sprays into both nostrils 2 (two) times daily. 06/19/15  Yes [name], PA-C  Lancet Devices (LANCING DEVICE) MISC Use to test blood sugar daily. One box 10/20/12  Yes Skanda Worlds, PA-C  Lancets MISC Use to test blood sugar daily 10/11/12  Yes Creta Dorame, PA-C  omeprazole (PRILOSEC) 20 MG capsule TAKE 1 CAPSULE(20 MG) BY MOUTH DAILY 12/02/16  Yes Seretha Estabrooks, PA-C  ONETOUCH VERIO test strip TEST AS DIRECTED 10/17/16  Yes Gerrianne Aydelott, PA-C  scopolamine (TRANSDERM-SCOP) 1 MG/3DAYS Place 1 patch (1.5 mg total) onto the skin every 3 (three) days. 06/09/17  Yes Haytham Maher, PA-C  spironolactone (ALDACTONE) 25 MG tablet Take 0.5 tablets (12.5 mg total) by mouth daily. 08/14/16  Yes Tramar Brueckner, PA-C  triamcinolone (KENALOG) 0.025 % ointment Apply 1 application topically 2 (two) times daily. 08/05/16  Yes Karime Scheuermann, PA-C  valsartan (DIOVAN) 320 MG tablet Take 1 tablet (320 mg total) by mouth daily. 04/29/16  Yes [name], PA-C  verapamil (VERELAN PM) 180 MG 24 hr capsule Take 180 mg by mouth daily. 11/12/16  Yes [provider]  WELCHOL 625 MG tablet TAKE 3 TABLETS BY MOUTH TWICE DAILY WITH A MEAL 07/02/16  Yes Fransico Sciandra, PA-C     Allergies  Allergen Reactions  . Codeine     REACTION: nausea  . Statins     Muscle pain       Objective:  Physical Exam  Constitutional: She is oriented to person, place, and time. She appears well-developed and well-nourished. She is active and  cooperative. No distress.  BP 124/68   Pulse 78   Temp 98.7 F (37.1 C)   Resp 16   Ht _0  (1.575 m)   Wt 195 lb 3.2 oz (88.5 kg)   LMP 01/10/2000   SpO2 97%   BMI 35.70 kg/m    HENT:  Head: Normocephalic and atraumatic.  Right Ear: External ear and ear canal normal. A middle ear effusion (air-fluid level ) is present.  Left Ear: Hearing, tympanic membrane, external ear and ear canal normal.  Nose: Nose normal.  Mouth/Throat: Uvula is midline, oropharynx is clear and moist and mucous membranes are normal.  Eyes: Conjunctivae are normal.  Pulmonary/Chest: Effort normal.  Neurological: She is alert and oriented to person, place, and time.  Psychiatric: She has a normal mood and affect. Her speech is normal and behavior is normal.           Assessment & Plan:   1. Acute effusion of right ear Continue azelastine. Add high-dose amoxicillin. Avoid prednisone due to diabetes. Recommend OTC Afrin for flights, especially with descent. - amoxicillin (AMOXIL) 875 MG tablet; Take 2 tablets (1,750 mg total) by mouth 2 (two) times daily for 7 days.  Dispense: 28 tablet; Refill: 0  2. Other allergic rhinitis PRN oral antihistamine. Continue azelastine. - ipratropium (ATROVENT) 0.03 % nasal spray; Place 2 sprays into both nostrils 2 (two) times daily.  Dispense: 30 mL; Refill: 12    Return if symptoms worsen or fail to improve.   Fara Chute, PA-C [facility]

## 2017-07-05 NOTE — Assessment & Plan Note (Signed)
Await lab results. Continue current treatment and lifestyle changes.

## 2017-07-05 NOTE — Assessment & Plan Note (Signed)
Await lab results. Goal LDL <70.

## 2017-07-05 NOTE — Assessment & Plan Note (Signed)
COntrolled. Continue current treatment. Healthy lifestyle changes.

## 2018-03-30 ENCOUNTER — Other Ambulatory Visit: Payer: Self-pay | Admitting: Physician Assistant

## 2018-03-30 ENCOUNTER — Ambulatory Visit
Admission: RE | Admit: 2018-03-30 | Discharge: 2018-03-30 | Disposition: A | Discharge status: Home / Self Care | Payer: Medicare Other | Source: Ambulatory Visit

## 2018-03-30 DIAGNOSIS — Z1231 Encounter for screening mammogram for malignant neoplasm of breast: Secondary | ICD-10-CM

## 2018-04-19 DIAGNOSIS — Z780 Asymptomatic menopausal state: Secondary | ICD-10-CM | POA: Insufficient documentation

## 2018-10-27 LAB — HM DIABETES EYE EXAM

## 2018-10-29 ENCOUNTER — Telehealth: Payer: Self-pay

## 2018-11-10 ENCOUNTER — Ambulatory Visit: Payer: Medicare Other | Admitting: Podiatry

## 2018-11-10 ENCOUNTER — Other Ambulatory Visit: Payer: Self-pay

## 2018-11-10 DIAGNOSIS — L989 Disorder of the skin and subcutaneous tissue, unspecified: Secondary | ICD-10-CM

## 2018-11-10 DIAGNOSIS — B351 Tinea unguium: Secondary | ICD-10-CM

## 2018-11-11 NOTE — Progress Notes (Signed)
   Subjective: 68 y.o. female presenting to the office today with a chief complaint of a painful callus lesion noted to the left sub-fifth MPJ that became symptomatic about two weeks ago. She states she normally trims the area which helps alleviate the pain. Walking and bearing weight increases the pain.  She also noted thickening of the toenails that began several months ago. She denies any pain or modifying factors. She has not had any treatment for the symptoms. Patient is here for further evaluation and treatment.   Past Medical History:  Diagnosis Date  . Asthma   . Asthma    childhood  . Cardiac murmur   . Diabetes mellitus   . HTN (hypertension)   . Microalbuminuria   . Obesity      Objective:  Physical Exam General: Alert and oriented x3 in no acute distress  Dermatology: Hyperkeratotic lesion(s) present on the bilateral feet. Pain on palpation with a central nucleated core noted. Hyperkeratotic, discolored, thickened, onychodystrophy of the right second toenail. Skin is warm, dry and supple bilateral lower extremities. Negative for open lesions or macerations.  Vascular: Palpable pedal pulses bilaterally. No edema or erythema noted. Capillary refill within normal limits.  Neurological: Epicritic and protective threshold grossly intact bilaterally.   Musculoskeletal Exam: Pain on palpation at the keratotic lesion(s) noted. Range of motion within normal limits bilateral. Muscle strength 5/5 in all groups bilateral.  Assessment: 1. Dystrophic nail right 2nd toe 2. Pre-ulcerative callus lesions noted to the bilateral feet x 3   Plan of Care:  1. Patient evaluated 2. Excisional debridement of keratoic lesion(s) using a chisel blade was performed without incident.  3. Dressed area with light dressing. 4. Mechanical debridement of the right 2nd toenail performed using a nail nipper. Filed with dremel without incident.  5. Recommended OTC corn and callus remover.  6.  Revitaderm 40% urea provided to patient.  7. Patient is to return to the clinic PRN.   Edrick Kins, DPM [facility]  Dr. Edrick Kins, [address]                                        [place], [place] [address]                Office [phone]  Fax [phone]

## 2019-03-20 ENCOUNTER — Ambulatory Visit: Payer: Medicare Other

## 2019-04-05 ENCOUNTER — Ambulatory Visit: Payer: Medicare Other

## 2019-05-06 IMAGING — MG 2D DIGITAL SCREENING BILATERAL MAMMOGRAM WITH 3D TOMO WITH CAD
8 of 13 series · 8 of 29 positions shown · non-contrast
Comparison: Previous exam(s).

CLINICAL DATA: Screening.

EXAM:
2D DIGITAL SCREENING BILATERAL MAMMOGRAM WITH 3D TOMO WITH CAD

[L MLO (1 of 2)]
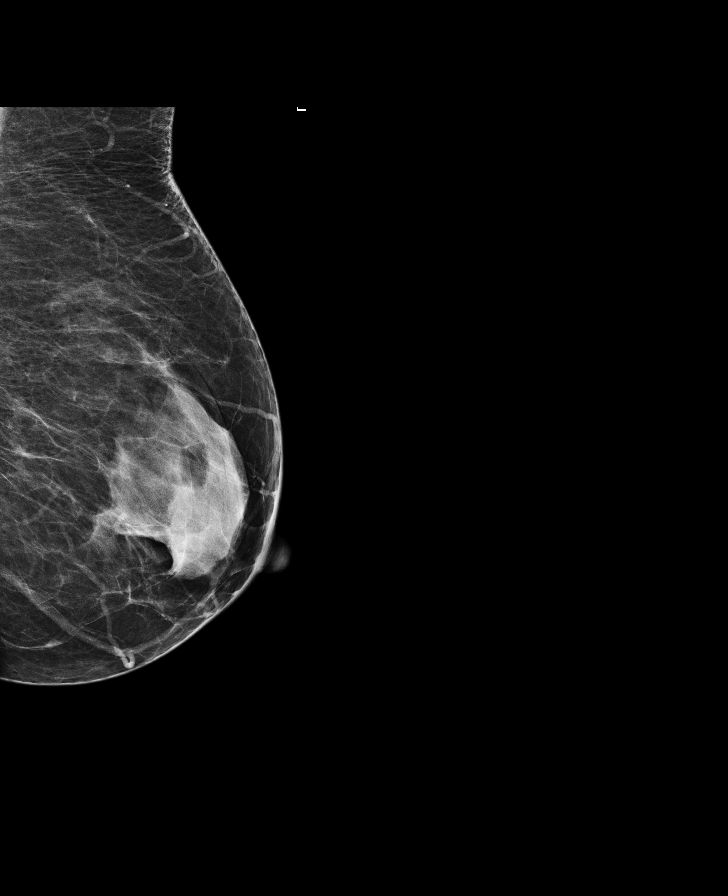

[L MLO (2 of 2)]
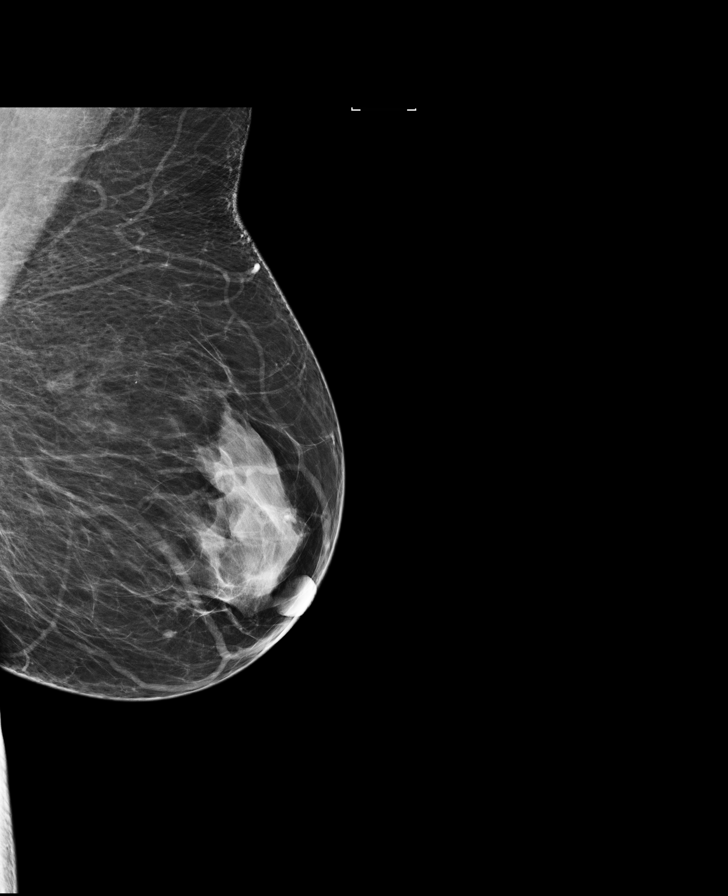

[R MLO synth-2D]
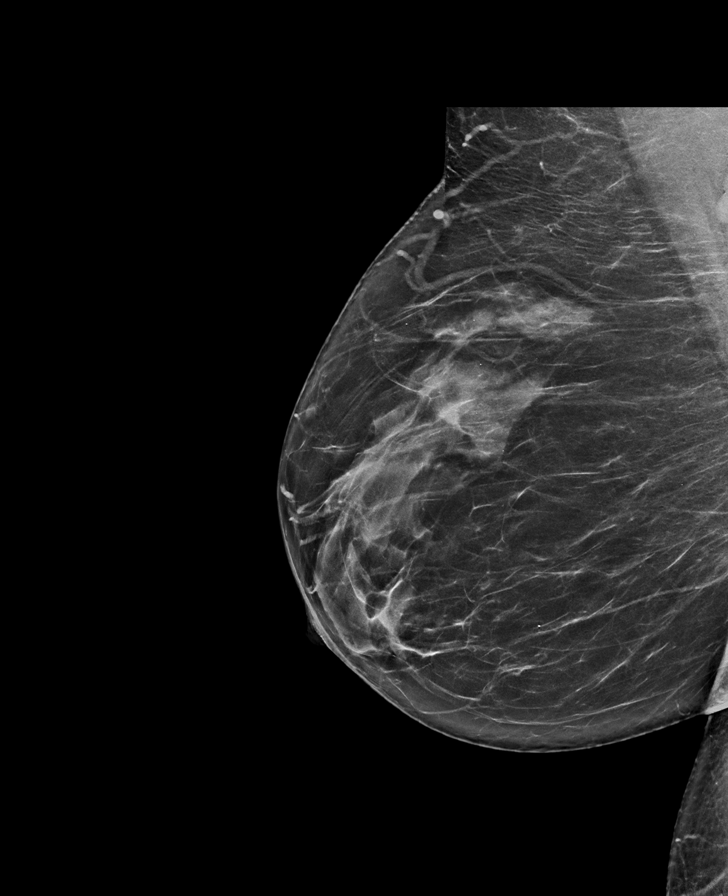

[L CC synth-2D]
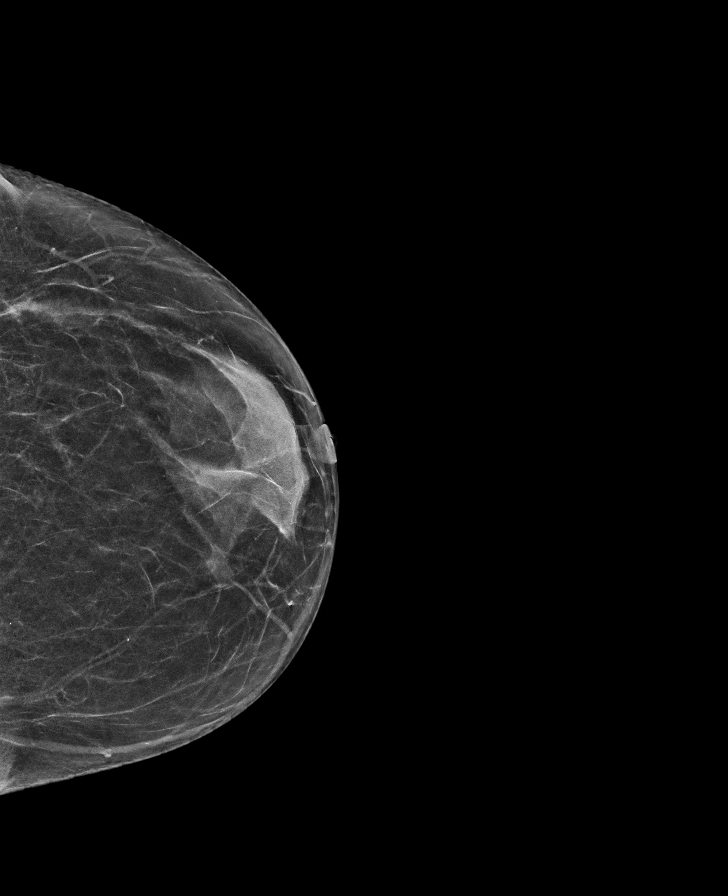

[R CC]
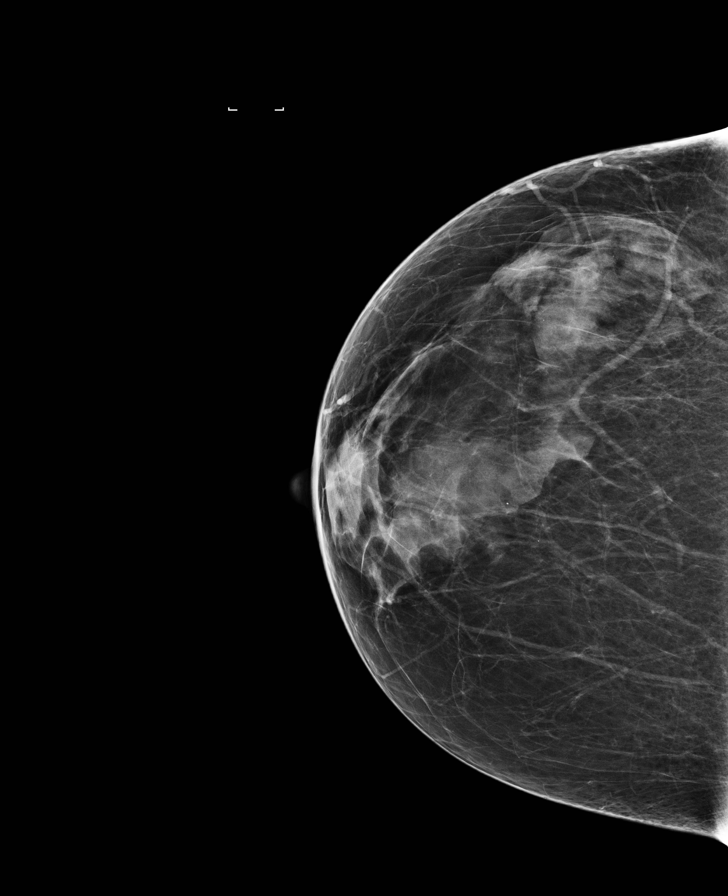

[R CC synth-2D]
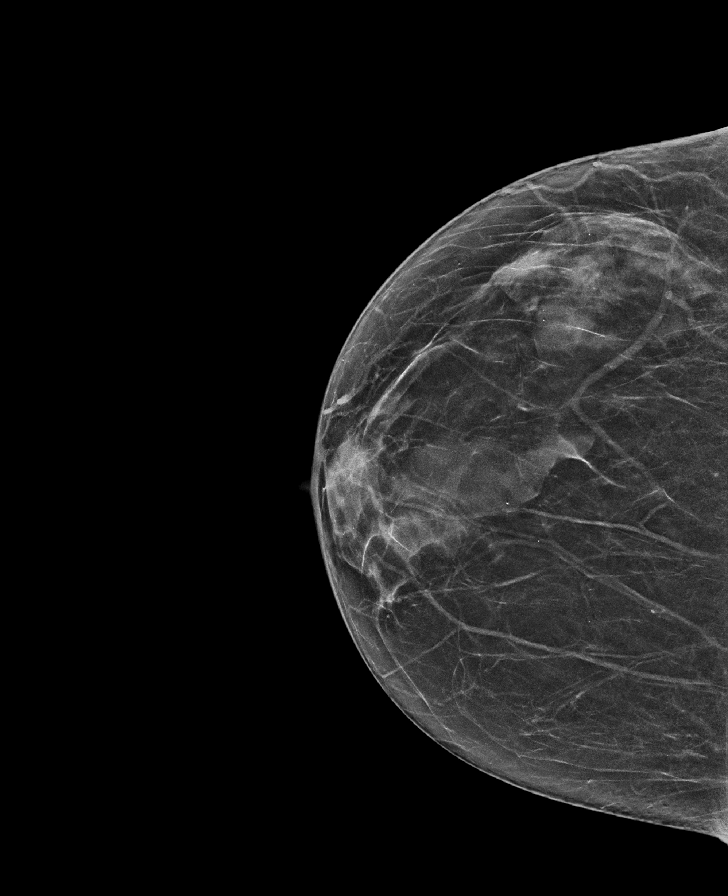

[L MLO synth-2D]
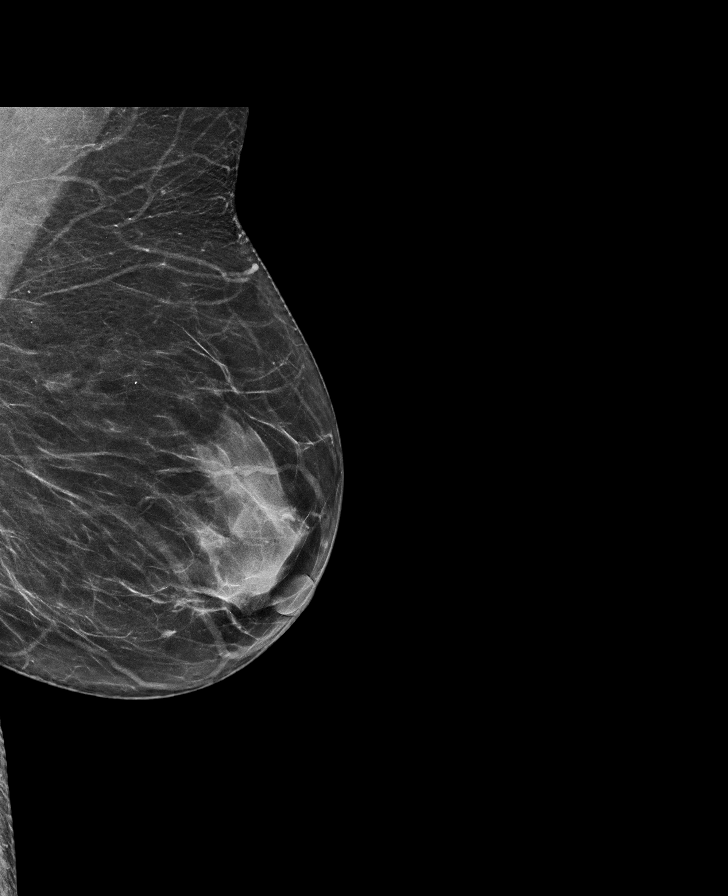

[R MLO]
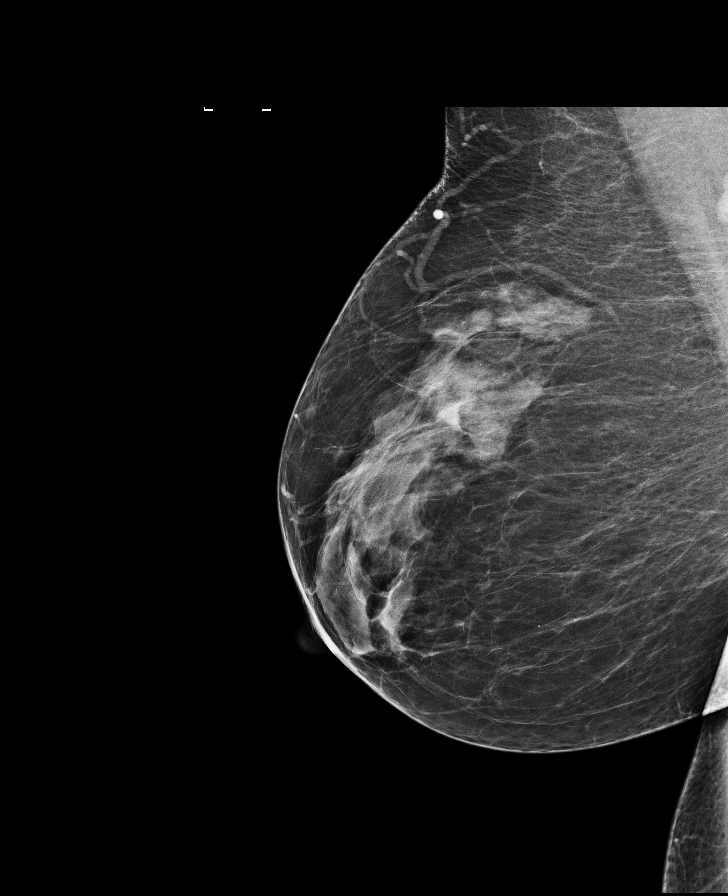

[8 of 29 positions shown; findings below may reference images not displayed]

ACR Breast Density Category c: The breast tissue is heterogeneously
dense, which may obscure small masses.
FINDINGS: There are no findings suspicious for malignancy. Images were
processed with CAD.
IMPRESSION: No mammographic evidence of malignancy. A result letter of this
screening mammogram will be mailed directly to the patient.

RECOMMENDATION:
Screening mammogram in one year. (Code:[id number])

BI-RADS CATEGORY  1: Negative.

## 2019-06-13 NOTE — Telephone Encounter (Signed)
No action required.

## 2019-11-10 LAB — COLOGUARD: COLOGUARD: POSITIVE — AB

## 2019-11-18 ENCOUNTER — Encounter: Payer: Self-pay | Admitting: Gastroenterology

## 2019-12-23 ENCOUNTER — Other Ambulatory Visit (HOSPITAL_COMMUNITY): Payer: Self-pay | Admitting: Gastroenterology

## 2019-12-23 DIAGNOSIS — R1032 Left lower quadrant pain: Secondary | ICD-10-CM

## 2020-01-03 ENCOUNTER — Encounter (HOSPITAL_COMMUNITY): Payer: Self-pay

## 2020-01-03 ENCOUNTER — Ambulatory Visit (HOSPITAL_COMMUNITY)
Admission: RE | Admit: 2020-01-03 | Discharge: 2020-01-03 | Disposition: A | Discharge status: Home / Self Care | Payer: Medicare PPO | Source: Ambulatory Visit | Attending: Gastroenterology | Admitting: Gastroenterology

## 2020-01-03 ENCOUNTER — Other Ambulatory Visit: Payer: Self-pay

## 2020-01-03 DIAGNOSIS — R1032 Left lower quadrant pain: Secondary | ICD-10-CM

## 2020-01-03 LAB — POCT I-STAT CREATININE: Creatinine, Ser: 0.8 mg/dL (ref 0.44–1.00)

## 2020-01-03 MED ORDER — IOHEXOL 300 MG/ML  SOLN
100.0000 mL | Freq: Once | INTRAMUSCULAR | Status: AC | PRN
  Administered 2020-01-03: 100 mL via INTRAVENOUS

## 2020-01-17 ENCOUNTER — Encounter: Payer: Medicare PPO | Admitting: Gastroenterology

## 2020-07-12 ENCOUNTER — Other Ambulatory Visit: Payer: Self-pay

## 2023-02-24 NOTE — H&P (Signed)
 History and Physical                                                                     Office Follow up Note  02/24/2023  Interval History:  Tracy Kennedy is  73 y.o. yrs old female who returns in follow-up today. She has long standing h/o hypertension, hyperlipidemia, statin intolerance, prediabetes, obesity.  She was noted to have aortic valve issues since young age, not sure if she had a bicuspid aortic valve. Recent echocardiogram revealed severe aortic stenosis with peak and mean gradient of 82 and 44 mmHg along with valve area of 0.9 cm, preserved ejection fraction, grade 1 diastolic dysfunction.  Patient appears to have mild exertional shortness of breath.  2 days ago, she was walking in the grocery store where she got short of breath.  No prior history of syncope or presyncope.  No chest pain or discomfort.  She also has noticed mild lower extremity edema in last few weeks to months. Patient is currently on chlorthalidone  25, Aldactone 25, losartan 100 mg, verapamil  180 mg for hypertension.  She has been on this regimen for a long time. She is also on Zetia 10 mg daily for hyperlipidemia, could not tolerate statin in the past secondary to muscular side effect.  Most recent LDL is 93 with total cholesterol of 172.  She is retired chartered loss adjuster.  Currently lives by herself. No history of smoking.   Echocardiogram 01/20/2023 .  Left Ventricle: Left ventricle size is normal. .  Left Ventricle: Systolic function is normal. EF: 55-60%. Quantitative analysis of left ventricular Global Longitudinal Strain (GLS) imaging is normal at  -18.600%. .  Left Ventricle: Doppler parameters consistent with mild diastolic dysfunction and low to normal LA pressure. .  Right Ventricle: Right ventricle size is normal. .  Right Ventricle: Systolic function is normal. .  Aortic Valve: There is severe stenosis, with peak and mean gradients of 82.000 and 44.000 mmHg, calculated valve area of 0.9-1 sq  cm. .  Mitral Valve: There is mild posterior annular calcification.  Nuclear stress test on 02/02/2023 Impression:   1. No chest pain with stress. 2. No ST changes to suggest ischemia. 3. Normal LV systolic function and wall motion. 4. No significant ischemia by perfusion imaging.   REVIEW OF SYSTEMS: The patient denies nausea, vomiting, diarrhea, fever, chills, or bleeding. All other systems are reviewed and are negative except for that mentioned in the history of present illness.  LABS:   Lab Results  Component Value Date   Cholesterol, Total 172 12/24/2022   Cholesterol, Total 184 09/24/2022   Cholesterol, Total 192 06/24/2022   Lab Results  Component Value Date   HDL 56 12/24/2022   HDL 52 09/24/2022   HDL 58 06/24/2022   Lab Results  Component Value Date   LDL 93 12/24/2022   LDL 107 (H) 09/24/2022   LDL 118 (H) 06/24/2022   Lab Results  Component Value Date   Triglycerides 129 12/24/2022   Triglycerides 141 09/24/2022   Triglycerides 91 06/24/2022   No results found for: CHOLHDL  Lab Results  Component Value Date   WBC 8.8 09/24/2022   HGB 12.4 09/24/2022   HCT 38.2 09/24/2022   MCV 90.0 09/24/2022  Plt Ct 311 09/24/2022    Lab Results  Component Value Date   Creatinine 0.69 12/24/2022   BUN 17 12/24/2022   Sodium 139 12/24/2022   Potassium 4.2 12/24/2022   Chloride 101 12/24/2022   CO2 22 12/24/2022   Lab Results  Component Value Date   TSH 0.729 03/26/2022    Results for orders placed or performed in visit on 05/16/21  Hemoglobin A1c  Result Value Ref Range   Hemoglobin A1c 6.4 (H) 4.8 - 5.6 %   Narrative   Performed at:  [address] [facility] [address], [place], [place]  [id number] Lab Director: Frankey Sas MD, Phone:  [phone]  Results for orders placed or performed in visit on 04/05/19  Hemoglobin A1c  Result Value Ref Range   Hemoglobin A1c 6.3 (H) 4.8 - 5.6 %   Narrative   Performed at:  [address]  [place] [address] [place], [place]  [id number] Lab Director: Frankey Sas MD, Phone:  [phone]  Results for orders placed or performed in visit on 09/09/17  Hemoglobin A1c  Result Value Ref Range   Hemoglobin A1c 6.2 (H) 4.8 - 5.6 %   Narrative   Performed at:  [address] [place] [address] [place], [place]  [id number] Lab Director: Frankey Sas MD, Phone:  [phone]    PHYSICAL EXAM:  Vital Signs:  BP 120/70 (BP Location: Left Upper Arm, Patient Position: Sitting)   Pulse 68   Ht 5' 2 (1.575 m)   Wt 203 lb 11.2 oz (92.4 kg)   LMP  (LMP Unknown)   SpO2 97%   BMI 37.26 kg/m  Constitutional: Well-nourished well-developed ,  no acute distress.  HENT:normocephalic,atraumatic,oropharynx moist,no oral exudates.   Neck: Supple. No JVD. Carotid bruits: Absent bilaterally. Eyes: Conjunctiva normal, no discharge. Lymphatic:no lymphadenopathy noted. Cardiovascular: RRR.  3+ systolic murmur right upper sternal border, no gallop. No rub. Respiratory: Mildly decreased breath sounds bilaterally.  No wheezes or crackles noted GI: Soft, nontender, nondistended, with normal bowel sounds, no masses or hepatosplenomegaly.  Skin: Warm, dry, no erythema, no rash. Musculoskeletal: No edema, no tenderness, no cyanosis, no clubbing. Pulses: 1+ and intact bilaterally Neurological:  Cranial nerves are intact with no focal deficits. Psychiatric: Alert and oriented to person, place and time, normal affect.   Past Medical History:  Diagnosis Date  . Aortic heart murmur   . Diabetes mellitus (*)   . Hyperlipidemia   . Hypertension   . Microalbuminuria    Past Surgical History:  Procedure Laterality Date  . Ankle surgery Right 1994   ligamentous injury repair  . Breast lumpectomy Left 10/2009   benign  . Bunionectomy     07/2010, 08/2011  . Cataract extraction, bilateral  2013  . 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 Lasik Bilateral 1999  . Wisdom tooth extraction       Allergies  Allergen Reactions  . Codeine Nausea And Vomiting    REACTION: nausea  . Statins Other    Muscle pain   Prior to Admission medications   Medication Sig Start Date End Date Taking? Authorizing Provider  ACCU-CHEK FASTCLIX LANCETS MISC USE TO MONITOR BLOOD GLUCOSE ONCE DAILY 10/29/22   Chelle Jeffery, PA-C  albuterol  sulfate HFA (PROVENTIL  HFA) 108 (90 Base) MCG/ACT inhaler Inhale two puffs into the lungs every 4 (four) hours as needed for Wheezing or Shortness of Breath. 03/26/22   Chelle Jeffery, PA-C  Ascorbic Acid (VITAMIN C) 100 MG CHEW  Historical Provider, MD  aspirin 81 mg tablet Take one tablet (81 mg dose) by mouth daily.    Historical Provider, MD  BIOTIN PO Take by mouth daily.     Historical Provider, MD  Blood Glucose Monitoring Suppl (ACCU-CHEK GUIDE ME) w/Device KIT  12/15/19   Historical Provider, MD  Blood Glucose Monitoring Suppl DEVI Use as directed.  Pharmacy, please dispense this brand of blood glucose monitoring device: Other: Accu Check Guide 11/20/20   Bernita Ned, PA-C  Calcium Carb-Cholecalciferol (CALCIUM 1000 + D) 1000-20 MG-MCG TABS Calcium    Historical Provider, MD  chlorthalidone  25 mg tablet TAKE 1 TABLET BY MOUTH DAILY 02/17/23   Chelle Jeffery, PA-C  clobetasol (TEMOVATE) 0.05% external solution Apply topically 2 (two) times a day as needed. 01/21/23   Chelle Jeffery, PA-C  clotrimazole -betamethasone  (LOTRISONE ) cream Apply 1 application topically 2 (two) times daily. 12/02/16   Historical Provider, MD  colestipol (COLESTID) 1 g tablet TAKE 1 TABLET BY MOUTH 2 TIMES A DAY 01/09/23   Chelle Jeffery, PA-C  COLLAGEN PO Take by mouth daily.    Historical Provider, MD  ezetimibe (ZETIA) 10 MG tablet TAKE ONE TABLET BY MOUTH DAILY 03/20/22   Chelle Jeffery, PA-C  gabapentin (NEURONTIN) 300 mg capsule TAKE TWO CAPSULES BY MOUTH AT BEDTIME 05/14/22   Chelle Jeffery, PA-C  glucose blood (ACCU-CHEK GUIDE) test strip USE TO CHECK BLOOD GLUCOSE DAILY 08/18/22    Chelle Jeffery, PA-C  ipratropium (ATROVENT ) 0.03% nasal spray two sprays by Nasal route 2 (two) times daily. 03/06/20   Chelle Jeffery, PA-C  Lactobacillus-Inulin (PROBIOTIC DIGESTIVE SUPPORT PO) Take by mouth.    Historical Provider, MD  Lancet Devices (LANCING DEVICE) MISC For home glucose monitoring 12/16/19   Bernita Ned, PA-C  Lancets Misc. (ACCU-CHEK FASTCLIX LANCET) KIT  12/16/19   Historical Provider, MD  losartan potassium (COZAAR) 100 mg tablet TAKE 1 TABLET BY MOUTH DAILY 02/18/23   David E Bouska, MD  Magnesium 100 MG CAPS     Historical Provider, MD  montelukast (SINGULAIR) 10 MG tablet TAKE 1 TABLET BY MOUTH EVERY NIGHT AT BEDTIME 01/02/23   Chelle Jeffery, PA-C  Multiple Vitamin (MULTIVITAMIN ADULT PO)     Historical Provider, MD  niacin (NIASPAN) 750 mg CR tablet Take one tablet (750 mg dose) by mouth at bedtime. 09/09/22   Chelle Jeffery, PA-C  omeprazole  (PRILOSEC) 20 mg capsule TAKE 1 CAPSULE BY MOUTH DAILY 11/11/22   Bernita Ned, PA-C  Probiotic Product (UP4 PROBIOTICS PO)     Historical Provider, MD  spironolactone (ALDACTONE) 25 MG tablet Take one half tablet (12.5 mg dose) by mouth daily. Patient taking differently: Take one tablet (25 mg dose) by mouth daily. 11/28/20   Chelle Jeffery, PA-C  triamcinolone  (KENALOG ) 0.025% ointment Apply 1 application topically 2 (two) times daily. 08/05/16   Historical Provider, MD  Turmeric 500 MG CAPS     Historical Provider, MD  verapamil  HCl (CALAN -SR) 180 mg ER tablet TAKE 1 TABLET BY MOUTH DAILY 07/30/22   Bernita Ned, PA-C   Social History   Socioeconomic History  . Marital status: Widowed    Spouse name: Verneita Pay  . Number of children: 0  . Years of education: 2/3 Master's degree work  . Highest education level: Bachelor's degree (e.g., BA, AB, BS)  Occupational History  . Occupation: retired    Comment: runner, broadcasting/film/video  Tobacco Use  . Smoking status: Never    Passive exposure: Never  . Smokeless tobacco: Never   Vaping Use  .  Vaping status: Never Used  Substance and Sexual Activity  . Alcohol use: Yes  . Drug use: Never  . Sexual activity: Yes    Partners: Female  Social History Narrative   Lived with her wife until her death 09-Oct-2021 of complications of multiple myeloma.   Cared for her father for a number of years, in the house next door, until his death in February 26, 2017.   Family History  Problem Relation Age of Onset  . Cancer Mother        mesothelioma  . Arthritis Sister        bilateral hips    Impression and plan:    1. Aortic valve stenosis, etiology of cardiac valve disease unspecified  [facility] REQUEST CATH LAB: Heart Cath-Left and Right   [facility] REQUEST CATH LAB: Heart Cath-Left and Right    2. Hypertension, unspecified type      3. Statin intolerance      4. Hyperlipidemia, unspecified hyperlipidemia type      5. Class 2 obesity with body mass index (BMI) of 37.0 to 37.9 in adult, unspecified obesity type, unspecified whether serious comorbidity present      6. Prediabetes                  She does have severe aortic stenosis by echocardiogram and auscultation.  She has now restarted to develop some symptoms of decompensation with worsening dyspnea on moderate exertion, trace lower extremity edema and decreased stamina.  I will start with left and right heart cardiac catheterization and will arrange follow-up with [facility] cardiac [facility] at [facility] for evaluation for TAVR. She is on Zetia, has issue with statin intolerance in the past.  The risks and benefits of cardiac catheterization were reviewed with the patient.  The possibility of percutaneous revascularization, stent placement, and possible need for coronary artery bypass surgery were reviewed. The risks include death, stroke, infection, allergic reaction, damage to heart ,lungs ,blood vessels or kidneys, bleeding, vascular damage, discomfort at the access site, and possible need for emergency surgery.   Questions were answered. Pt agrees to proceed.   We had discussion about importance of life style modification, keeping BP, blood sugar and cholesterol, body weight at goal, role of regular exercise (at least 30 minutes of moderate intensity aerobic exercise 5 days in a week), following low fat, low carb, low salt heart healthy diet, abstinence from smoking and drinking etc.  We also discussed about visiting PCP visit for non cardiac issues, importance of regular follow up, health screening  and medication compliance.    Will follow-up her after cardiac catheterization.    Marcello MARLA Lennox, MD    Note: This documented was generated using voice recognition software. There may be unintended transcription errors that were not detected upon document review.   Electronically signed: Madan K Badal, MD 02/24/2023 / 5:10 PM

## 2023-08-21 ENCOUNTER — Telehealth (HOSPITAL_COMMUNITY): Payer: Self-pay

## 2023-08-21 NOTE — Telephone Encounter (Signed)
 Outside/paper referral received by from [facility], referral did not have a referring provider listed and is incomplete. Will fax over Physician order and request EKG. Insurance benefits and eligibility to be determined.

## 2023-09-02 ENCOUNTER — Encounter (HOSPITAL_COMMUNITY): Payer: Self-pay

## 2023-09-14 ENCOUNTER — Telehealth (HOSPITAL_COMMUNITY): Payer: Self-pay

## 2023-09-14 NOTE — Telephone Encounter (Signed)
 Patient called to get scheduled in the Cardiac Rehab Program. Patient will come in for orientation on 08/26 and will attend the 10:15 exercise class.  Sent MyChart message.

## 2023-10-05 ENCOUNTER — Telehealth (HOSPITAL_COMMUNITY): Payer: Self-pay

## 2023-10-05 NOTE — Telephone Encounter (Signed)
 Pt did not pick up phone but message left for pt @ 1656 to try and confirm pt's 1045 orientation for 10/06/23.

## 2023-10-06 ENCOUNTER — Encounter (HOSPITAL_COMMUNITY)
Admission: RE | Admit: 2023-10-06 | Discharge: 2023-10-06 | Disposition: A | Discharge status: Home / Self Care | Source: Ambulatory Visit | Attending: Cardiology | Admitting: Cardiology

## 2023-10-06 VITALS — BP 146/84 | HR 67 | Ht 62.0 in | Wt 205.2 lb

## 2023-10-06 DIAGNOSIS — Z952 Presence of prosthetic heart valve: Secondary | ICD-10-CM | POA: Insufficient documentation

## 2023-10-06 NOTE — Progress Notes (Signed)
 Cardiac Individual Treatment Plan  Patient Details  Name: Tracy Kennedy MRN: [id number] Date of Birth: Sep 05, 1950 Referring Provider:   Flowsheet Row INTENSIVE CARDIAC REHAB ORIENT from 10/06/2023 in [facility]  Referring Provider Uvaldo Fusi, MD  Tracy Tracy SAUNDERS, MD]    Initial Encounter Date:  Flowsheet Row INTENSIVE CARDIAC REHAB ORIENT from 10/06/2023 in [facility]  Date 10/06/23    Visit Diagnosis: 08/11/23 S/P TAVR (transcatheter aortic valve replacement)  Patient's Home Medications on Admission:  Current Outpatient Medications:    ACETAMINOPHEN PO, Take by mouth 2 (two) times daily., Disp: , Rfl:    albuterol  (PROVENTIL  HFA;VENTOLIN  HFA) 108 (90 Base) MCG/ACT inhaler, Inhale 2 puffs into the lungs every 6 (six) hours as needed., Disp: 1 Inhaler, Rfl: 1   Ascorbic Acid (VITAMIN C) 1000 MG tablet, Take 1,000 mg by mouth daily., Disp: , Rfl:    aspirin 81 MG tablet, Take 81 mg by mouth daily., Disp: , Rfl:    BIOTIN PO, Take by mouth daily., Disp: , Rfl:    Blood Glucose Monitoring Suppl (ONETOUCH VERIO) W/DEVICE KIT, 1 Device by Does not apply route AC breakfast., Disp: 1 kit, Rfl: 12   CALCIUM PO, Take by mouth., Disp: , Rfl:    chlorthalidone  (HYGROTON ) 25 MG tablet, TAKE 1 TABLET(25 MG) BY MOUTH DAILY (Patient taking differently: Take 12.5 mg by mouth daily. TAKE 1 TABLET(12.5 MG) BY MOUTH DAILY), Disp: 90 tablet, Rfl: 3   Cholecalciferol (VITAMIN D3) 50 MCG (2000 UT) capsule, Take 2,000 Units by mouth daily., Disp: , Rfl:    clobetasol (TEMOVATE) 0.05 % external solution, , Disp: , Rfl:    clotrimazole -betamethasone  (LOTRISONE ) cream, Apply 1 application topically 2 (two) times daily., Disp: 30 g, Rfl: 0   colestipol (COLESTID) 1 g tablet, , Disp: , Rfl:    ezetimibe (ZETIA) 10 MG tablet, Take 10 mg by mouth daily., Disp: , Rfl:    gabapentin (NEURONTIN) 300 MG  capsule, Take 300 mg by mouth 2 (two) times daily., Disp: , Rfl:    GLUCOSAMINE PO, Take by mouth., Disp: , Rfl:    ipratropium (ATROVENT ) 0.03 % nasal spray, Place 2 sprays into both nostrils 2 (two) times daily. (Patient taking differently: Place 2 sprays into both nostrils as needed for rhinitis.), Disp: 30 mL, Rfl: 12   Lancet Devices (LANCING DEVICE) MISC, Use to test blood sugar daily. One box, Disp: 1 each, Rfl: 2   Lancets MISC, Use to test blood sugar daily, Disp: 100 each, Rfl: 2   losartan (COZAAR) 100 MG tablet, , Disp: , Rfl:    magnesium oxide (MAG-OX) 400 (240 Mg) MG tablet, Take 400 mg by mouth daily., Disp: , Rfl:    montelukast (SINGULAIR) 10 MG tablet, Take 10 mg by mouth at bedtime., Disp: , Rfl:    Multiple Vitamin (MULTIVITAMIN) tablet, Take 1 tablet by mouth daily., Disp: , Rfl:    niacin (NIASPAN) 750 MG CR tablet, Take 750 mg by mouth at bedtime., Disp: , Rfl:    omeprazole  (PRILOSEC) 20 MG capsule, TAKE 1 CAPSULE(20 MG) BY MOUTH DAILY, Disp: 90 capsule, Rfl: 3   ONETOUCH VERIO test strip, TEST AS DIRECTED, Disp: 100 each, Rfl: 1   spironolactone (ALDACTONE) 25 MG tablet, Take 0.5 tablets (12.5 mg total) by mouth daily., Disp: , Rfl:    triamcinolone  (KENALOG ) 0.025 % ointment, Apply 1 application topically 2 (two)  times daily., Disp: 30 g, Rfl: 0   Turmeric (QC TUMERIC COMPLEX PO), Take 1,000 mg by mouth daily., Disp: , Rfl:    verapamil  (VERELAN  PM) 180 MG 24 hr capsule, Take 180 mg by mouth daily., Disp: , Rfl: 4   vitamin E 1000 UNIT capsule, Take 1,000 Units by mouth daily., Disp: , Rfl:    cetirizine  (ZYRTEC ) 10 MG tablet, TAKE 1 TABLET(10 MG) BY MOUTH DAILY (Patient not taking: Reported on 10/06/2023), Disp: 90 tablet, Rfl: 0   Cyanocobalamin (VITAMIN B-12 PO), Take by mouth. (Patient not taking: Reported on 10/06/2023), Disp: , Rfl:    famotidine (PEPCID) 20 MG tablet, Take by mouth., Disp: , Rfl:    loratadine (CLARITIN) 10 MG tablet, Take by mouth., Disp: , Rfl:     scopolamine  (TRANSDERM-SCOP) 1 MG/3DAYS, Place 1 patch (1.5 mg total) onto the skin every 3 (three) days. (Patient not taking: Reported on 10/06/2023), Disp: 10 patch, Rfl: 0   valsartan  (DIOVAN ) 320 MG tablet, Take 1 tablet (320 mg total) by mouth daily., Disp: 90 tablet, Rfl: 3   WELCHOL  625 MG tablet, TAKE 3 TABLETS BY MOUTH TWICE DAILY WITH A MEAL (Patient not taking: Reported on 10/06/2023), Disp: 540 tablet, Rfl: 3  Current Facility-Administered Medications:    betamethasone  acetate-betamethasone  sodium phosphate (CELESTONE ) injection 3 mg, 3 mg, Intramuscular, Once, Tracy Kennedy, DPM  Past Medical History: Past Medical History:  Diagnosis Date   Asthma    Asthma    childhood   Cardiac murmur    Diabetes mellitus    HTN (hypertension)    Microalbuminuria    Obesity     Tobacco Use: Social History   Tobacco Use  Smoking Status Never  Smokeless Tobacco Never    Labs: Review Flowsheet  More data exists      Latest Ref Rng & Units 01/15/2016 04/29/2016 12/02/2016 03/10/2017 06/09/2017  Labs for ITP Cardiac and Pulmonary Rehab  Cholestrol 100 - 199 mg/dL 842  744  799  773  809   LDL (calc) 0 - 99 mg/dL 80  821  879  860  883   HDL-C >39 mg/dL 53  53  56  53  48   Trlycerides 0 - 149 mg/dL 881  879  879  831  871   Hemoglobin A1c 4.8 - 5.6 % 5.8  5.7  6.1  6.0  6.0     Capillary Blood Glucose: No results found for: GLUCAP   Exercise Target Goals: Exercise Program Goal: Individual exercise prescription set using results from initial 6 min walk test and THRR while considering  patient's activity barriers and safety.   Exercise Prescription Goal: Initial exercise prescription builds to 30-45 minutes a day of aerobic activity, 2-3 days per week.  Home exercise guidelines will be given to patient during program as part of exercise prescription that the participant will acknowledge.  Activity Barriers & Risk Stratification:  Activity Barriers & Cardiac Risk  Stratification - 10/06/23 1207       Activity Barriers & Cardiac Risk Stratification   Activity Barriers Back Problems;History of Falls;Balance Concerns;Other (comment)    Comments Pinched nerve- right leg. Pain in left hip down leg. Chronic low back pain.    Cardiac Risk Stratification High          6 Minute Walk:  6 Minute Walk     Row Name 10/06/23 1225         6 Minute Walk   Phase Initial  Distance 1168 feet     Walk Time 6 minutes     # of Rest Breaks 0     MPH 2.21     METS 2.21     RPE 11     Perceived Dyspnea  1     VO2 Peak 7.73     Symptoms Yes (comment)     Comments Mild shortness of breath.     Resting HR 67 bpm     Resting BP 146/84     Resting Oxygen Saturation  95 %     Exercise Oxygen Saturation  during 6 min walk 97 %     Max Ex. HR 93 bpm     Max Ex. BP 168/68     2 Minute Post BP 134/70        Oxygen Initial Assessment:   Oxygen Re-Evaluation:   Oxygen Discharge (Final Oxygen Re-Evaluation):   Initial Exercise Prescription:  Initial Exercise Prescription - 10/06/23 1400       Date of Initial Exercise RX and Referring Provider   Date 10/06/23    Referring Provider Uvaldo Fusi, MD   Tracy Tracy SAUNDERS, MD   Expected Discharge Date 01/01/24      Recumbant Bike   Level 1    Watts 10    Minutes 15    METs 2.2      NuStep   Level 1    SPM 85    Minutes 15    METs 2.2      Prescription Details   Frequency (times per week) 3    Duration Progress to 30 minutes of continuous aerobic without signs/symptoms of physical distress      Intensity   THRR 40-80% of Max Heartrate 60-119    Ratings of Perceived Exertion 11-13    Perceived Dyspnea 0-4      Progression   Progression Continue to progress workloads to maintain intensity without signs/symptoms of physical distress.      Resistance Training   Training Prescription Yes    Weight 2 lbs    Reps 10-15          Perform Capillary Blood Glucose checks as  needed.  Exercise Prescription Changes:   Exercise Comments:   Exercise Goals and Review:   Exercise Goals     Row Name 10/06/23 1207             Exercise Goals   Increase Physical Activity Yes       Intervention Provide advice, education, support and counseling about physical activity/exercise needs.;Develop an individualized exercise prescription for aerobic and resistive training based on initial evaluation findings, risk stratification, comorbidities and participant's personal goals.       Expected Outcomes Short Term: Attend rehab on a regular basis to increase amount of physical activity.;Long Term: Exercising regularly at least 3-5 days a week.;Long Term: Add in home exercise to make exercise part of routine and to increase amount of physical activity.       Increase Strength and Stamina Yes       Intervention Provide advice, education, support and counseling about physical activity/exercise needs.;Develop an individualized exercise prescription for aerobic and resistive training based on initial evaluation findings, risk stratification, comorbidities and participant's personal goals.       Expected Outcomes Short Term: Increase workloads from initial exercise prescription for resistance, speed, and METs.;Short Term: Perform resistance training exercises routinely during rehab and add in resistance training at home;Long Term: Improve cardiorespiratory fitness, muscular endurance and  strength as measured by increased METs and functional capacity ( )       Able to understand and use rate of perceived exertion (RPE) scale Yes       Intervention Provide education and explanation on how to use RPE scale       Expected Outcomes Short Term: Able to use RPE daily in rehab to express subjective intensity level;Long Term:  Able to use RPE to guide intensity level when exercising independently       Knowledge and understanding of Target Heart Rate Range (THRR) Yes       Intervention  Provide education and explanation of THRR including how the numbers were predicted and where they are located for reference       Expected Outcomes Short Term: Able to state/look up THRR;Short Term: Able to use daily as guideline for intensity in rehab;Long Term: Able to use THRR to govern intensity when exercising independently       Able to check pulse independently Yes       Intervention Provide education and demonstration on how to check pulse in carotid and radial arteries.;Review the importance of being able to check your own pulse for safety during independent exercise       Expected Outcomes Short Term: Able to explain why pulse checking is important during independent exercise;Long Term: Able to check pulse independently and accurately       Understanding of Exercise Prescription Yes       Intervention Provide education, explanation, and written materials on patient's individual exercise prescription       Expected Outcomes Short Term: Able to explain program exercise prescription;Long Term: Able to explain home exercise prescription to exercise independently          Exercise Goals Re-Evaluation :   Discharge Exercise Prescription (Final Exercise Prescription Changes):   Nutrition:  Target Goals: Understanding of nutrition guidelines, daily intake of sodium 1500mg , cholesterol 200mg , calories 30% from fat and 7% or less from saturated fats, daily to have 5 or more servings of fruits and vegetables.  Biometrics:  Pre Biometrics - 10/06/23 1037       Pre Biometrics   Waist Circumference 45.75 inches    Hip Circumference 49 inches    Waist to Hip Ratio 0.93 %    Triceps Skinfold 43 mm    % Body Fat 51.2 %    Grip Strength 14 kg    Flexibility --   Not performed. Chronic low back pain.   Single Leg Stand 7.75 seconds           Nutrition Therapy Plan and Nutrition Goals:   Nutrition Assessments:  MEDIFICTS Score Key: >=70 Need to make dietary changes  40-70 Heart  Healthy Diet <= 40 Therapeutic Level Cholesterol Diet    Picture Your Plate Scores: <59 Unhealthy dietary pattern with much room for improvement. 41-50 Dietary pattern unlikely to meet recommendations for good health and room for improvement. 51-60 More healthful dietary pattern, with some room for improvement.  >60 Healthy dietary pattern, although there may be some specific behaviors that could be improved.    Nutrition Goals Re-Evaluation:   Nutrition Goals Re-Evaluation:   Nutrition Goals Discharge (Final Nutrition Goals Re-Evaluation):   Psychosocial: Target Goals: Acknowledge presence or absence of significant depression and/or stress, maximize coping skills, provide positive support system. Participant is able to verbalize types and ability to use techniques and skills needed for reducing stress and depression.  Initial Review & Psychosocial Screening:  Initial Psych  Review & Screening - 10/06/23 1150       Initial Review   Current issues with Current Sleep Concerns;Current Stress Concerns;Current Anxiety/Panic    Source of Stress Concerns Unable to perform yard/household activities   health concerns     Family Dynamics   Good Support System? Yes   Pt has a good support system with her sister, BIL and nieces, friends, and church   Concerns Recent loss of significant other      Barriers   Psychosocial barriers to participate in program The patient should benefit from training in stress management and relaxation.      Screening Interventions   Interventions Encouraged to exercise;To provide support and resources with identified psychosocial needs;Provide feedback about the scores to participant    Expected Outcomes Long Term Goal: Stressors or current issues are controlled or eliminated.;Short Term goal: Identification and review with participant of any Quality of Life or Depression concerns found by scoring the questionnaire.;Long Term goal: The participant improves  quality of Life and PHQ9 Scores as seen by post scores and/or verbalization of changes          Quality of Life Scores:  Quality of Life - 10/06/23 1416       Quality of Life   Select Quality of Life      Quality of Life Scores   Health/Function Pre 19.71 %    Socioeconomic Pre 30 %    Psych/Spiritual Pre 21.43 %    Family Pre 20 %    GLOBAL Pre 22.45 %         Scores of 19 and below usually indicate a poorer quality of life in these areas.  A difference of  2-3 points is a clinically meaningful difference.  A difference of 2-3 points in the total score of the Quality of Life Index has been associated with significant improvement in overall quality of life, self-image, physical symptoms, and general health in studies assessing change in quality of life.  PHQ-9: Review Flowsheet  More data exists      10/06/2023 06/19/2017 06/09/2017 03/10/2017 12/02/2016  Depression screen PHQ 2/9  Decreased Interest 0 0 0 0 0  Down, Depressed, Hopeless 0 0 0 0 0  PHQ - 2 Score 0 0 0 0 0  Altered sleeping 1 - - - -  Tired, decreased energy 1 - - - -  Change in appetite 2 - - - -  Feeling bad or failure about yourself  2 - - - -  Trouble concentrating 0 - - - -  Moving slowly or fidgety/restless 0 - - - -  Suicidal thoughts 0 - - - -  PHQ-9 Score 6 - - - -  Difficult doing work/chores Somewhat difficult - - - -   Interpretation of Total Score  Total Score Depression Severity:  1-4 = Minimal depression, 5-9 = Mild depression, 10-14 = Moderate depression, 15-19 = Moderately severe depression, 20-27 = Severe depression   Psychosocial Evaluation and Intervention:   Psychosocial Re-Evaluation:   Psychosocial Discharge (Final Psychosocial Re-Evaluation):   Vocational Rehabilitation: Provide vocational rehab assistance to qualifying candidates.   Vocational Rehab Evaluation & Intervention:  Vocational Rehab - 10/06/23 1236       Initial Vocational Rehab Evaluation & Intervention    Assessment shows need for Vocational Rehabilitation No   retired         Education: Education Goals: Education classes will be provided on a weekly basis, covering required topics. Participant will state understanding/return  demonstration of topics presented.     Core Videos: Exercise    Move It!  Clinical staff conducted group or individual video education with verbal and written material and guidebook.  Patient learns the recommended Pritikin exercise program. Exercise with the goal of living a long, healthy life. Some of the health benefits of exercise include controlled diabetes, healthier blood pressure levels, improved cholesterol levels, improved heart and lung capacity, improved sleep, and better body composition. Everyone should speak with their doctor before starting or changing an exercise routine.  Biomechanical Limitations Clinical staff conducted group or individual video education with verbal and written material and guidebook.  Patient learns how biomechanical limitations can impact exercise and how we can mitigate and possibly overcome limitations to have an impactful and balanced exercise routine.  Body Composition Clinical staff conducted group or individual video education with verbal and written material and guidebook.  Patient learns that body composition (ratio of muscle mass to fat mass) is a key component to assessing overall fitness, rather than body weight alone. Increased fat mass, especially visceral belly fat, can put us  at increased risk for metabolic syndrome, type 2 diabetes, heart disease, and even death. It is recommended to combine diet and exercise (cardiovascular and resistance training) to improve your body composition. Seek guidance from your physician and exercise physiologist before implementing an exercise routine.  Exercise Action Plan Clinical staff conducted group or individual video education with verbal and written material and guidebook.   Patient learns the recommended strategies to achieve and enjoy long-term exercise adherence, including variety, self-motivation, self-efficacy, and positive decision making. Benefits of exercise include fitness, good health, weight management, more energy, better sleep, less stress, and overall well-being.  Medical   Heart Disease Risk Reduction Clinical staff conducted group or individual video education with verbal and written material and guidebook.  Patient learns our heart is our most vital organ as it circulates oxygen, nutrients, white blood cells, and hormones throughout the entire body, and carries waste away. Data supports a plant-based eating plan like the Pritikin Program for its effectiveness in slowing progression of and reversing heart disease. The video provides a number of recommendations to address heart disease.   Metabolic Syndrome and Belly Fat  Clinical staff conducted group or individual video education with verbal and written material and guidebook.  Patient learns what metabolic syndrome is, how it leads to heart disease, and how one can reverse it and keep it from coming back. You have metabolic syndrome if you have 3 of the following 5 criteria: abdominal obesity, high blood pressure, high triglycerides, low HDL cholesterol, and high blood sugar.  Hypertension and Heart Disease Clinical staff conducted group or individual video education with verbal and written material and guidebook.  Patient learns that high blood pressure, or hypertension, is very common in the [place] . Hypertension is largely due to excessive salt intake, but other important risk factors include being overweight, physical inactivity, drinking too much alcohol, smoking, and not eating enough potassium from fruits and vegetables. High blood pressure is a leading risk factor for heart attack, stroke, congestive heart failure, dementia, kidney failure, and premature death. Long-term effects of  excessive salt intake include stiffening of the arteries and thickening of heart muscle and organ damage. Recommendations include ways to reduce hypertension and the risk of heart disease.  Diseases of Our Time - Focusing on Diabetes Clinical staff conducted group or individual video education with verbal and written material and guidebook.  Patient learns why the  best way to stop diseases of our time is prevention, through food and other lifestyle changes. Medicine (such as prescription pills and surgeries) is often only a Band-Aid on the problem, not a long-term solution. Most common diseases of our time include obesity, type 2 diabetes, hypertension, heart disease, and cancer. The Pritikin Program is recommended and has been proven to help reduce, reverse, and/or prevent the damaging effects of metabolic syndrome.  Nutrition   Overview of the Pritikin Eating Plan  Clinical staff conducted group or individual video education with verbal and written material and guidebook.  Patient learns about the Pritikin Eating Plan for disease risk reduction. The Pritikin Eating Plan emphasizes a wide variety of unrefined, minimally-processed carbohydrates, like fruits, vegetables, whole grains, and legumes. Go, Caution, and Stop food choices are explained. Plant-based and lean animal proteins are emphasized. Rationale provided for low sodium intake for blood pressure control, low added sugars for blood sugar stabilization, and low added fats and oils for coronary artery disease risk reduction and weight management.  Calorie Density  Clinical staff conducted group or individual video education with verbal and written material and guidebook.  Patient learns about calorie density and how it impacts the Pritikin Eating Plan. Knowing the characteristics of the food you choose will help you decide whether those foods will lead to weight gain or weight loss, and whether you want to consume more or less of them. Weight  loss is usually a side effect of the Pritikin Eating Plan because of its focus on low calorie-dense foods.  Label Reading  Clinical staff conducted group or individual video education with verbal and written material and guidebook.  Patient learns about the Pritikin recommended label reading guidelines and corresponding recommendations regarding calorie density, added sugars, sodium content, and whole grains.  Dining Out - Part 1  Clinical staff conducted group or individual video education with verbal and written material and guidebook.  Patient learns that restaurant meals can be sabotaging because they can be so high in calories, fat, sodium, and/or sugar. Patient learns recommended strategies on how to positively address this and avoid unhealthy pitfalls.  Facts on Fats  Clinical staff conducted group or individual video education with verbal and written material and guidebook.  Patient learns that lifestyle modifications can be just as effective, if not more so, as many medications for lowering your risk of heart disease. A Pritikin lifestyle can help to reduce your risk of inflammation and atherosclerosis (cholesterol build-up, or plaque, in the artery walls). Lifestyle interventions such as dietary choices and physical activity address the cause of atherosclerosis. A review of the types of fats and their impact on blood cholesterol levels, along with dietary recommendations to reduce fat intake is also included.  Nutrition Action Plan  Clinical staff conducted group or individual video education with verbal and written material and guidebook.  Patient learns how to incorporate Pritikin recommendations into their lifestyle. Recommendations include planning and keeping personal health goals in mind as an important part of their success.  Healthy Mind-Set    Healthy Minds, Bodies, Hearts  Clinical staff conducted group or individual video education with verbal and written material and  guidebook.  Patient learns how to identify when they are stressed. Video will discuss the impact of that stress, as well as the many benefits of stress management. Patient will also be introduced to stress management techniques. The way we think, act, and feel has an impact on our hearts.  How Our Thoughts Can Heal Our Hearts  Clinical staff conducted group or individual video education with verbal and written material and guidebook.  Patient learns that negative thoughts can cause depression and anxiety. This can result in negative lifestyle behavior and serious health problems. Cognitive behavioral therapy is an effective method to help control our thoughts in order to change and improve our emotional outlook.  Additional Videos:  Exercise    Improving Performance  Clinical staff conducted group or individual video education with verbal and written material and guidebook.  Patient learns to use a non-linear approach by alternating intensity levels and lengths of time spent exercising to help burn more calories and lose more body fat. Cardiovascular exercise helps improve heart health, metabolism, hormonal balance, blood sugar control, and recovery from fatigue. Resistance training improves strength, endurance, balance, coordination, reaction time, metabolism, and muscle mass. Flexibility exercise improves circulation, posture, and balance. Seek guidance from your physician and exercise physiologist before implementing an exercise routine and learn your capabilities and proper form for all exercise.  Introduction to Yoga  Clinical staff conducted group or individual video education with verbal and written material and guidebook.  Patient learns about yoga, a discipline of the coming together of mind, breath, and body. The benefits of yoga include improved flexibility, improved range of motion, better posture and core strength, increased lung function, weight loss, and positive self-image. Yoga's  heart health benefits include lowered blood pressure, healthier heart rate, decreased cholesterol and triglyceride levels, improved immune function, and reduced stress. Seek guidance from your physician and exercise physiologist before implementing an exercise routine and learn your capabilities and proper form for all exercise.  Medical   Aging: Enhancing Your Quality of Life  Clinical staff conducted group or individual video education with verbal and written material and guidebook.  Patient learns key strategies and recommendations to stay in good physical health and enhance quality of life, such as prevention strategies, having an advocate, securing a Health Care Proxy and Power of Attorney, and keeping a list of medications and system for tracking them. It also discusses how to avoid risk for bone loss.  Biology of Weight Control  Clinical staff conducted group or individual video education with verbal and written material and guidebook.  Patient learns that weight gain occurs because we consume more calories than we burn (eating more, moving less). Even if your body weight is normal, you may have higher ratios of fat compared to muscle mass. Too much body fat puts you at increased risk for cardiovascular disease, heart attack, stroke, type 2 diabetes, and obesity-related cancers. In addition to exercise, following the Pritikin Eating Plan can help reduce your risk.  Decoding Lab Results  Clinical staff conducted group or individual video education with verbal and written material and guidebook.  Patient learns that lab test reflects one measurement whose values change over time and are influenced by many factors, including medication, stress, sleep, exercise, food, hydration, pre-existing medical conditions, and more. It is recommended to use the knowledge from this video to become more involved with your lab results and evaluate your numbers to speak with your doctor.   Diseases of Our Time -  Overview  Clinical staff conducted group or individual video education with verbal and written material and guidebook.  Patient learns that according to the CDC, 50% to 70% of chronic diseases (such as obesity, type 2 diabetes, elevated lipids, hypertension, and heart disease) are avoidable through lifestyle improvements including healthier food choices, listening to satiety cues, and increased physical activity.  Sleep  Disorders Clinical staff conducted group or individual video education with verbal and written material and guidebook.  Patient learns how good quality and duration of sleep are important to overall health and well-being. Patient also learns about sleep disorders and how they impact health along with recommendations to address them, including discussing with a physician.  Nutrition  Dining Out - Part 2 Clinical staff conducted group or individual video education with verbal and written material and guidebook.  Patient learns how to plan ahead and communicate in order to maximize their dining experience in a healthy and nutritious manner. Included are recommended food choices based on the type of restaurant the patient is visiting.   Fueling a Banker conducted group or individual video education with verbal and written material and guidebook.  There is a strong connection between our food choices and our health. Diseases like obesity and type 2 diabetes are very prevalent and are in large-part due to lifestyle choices. The Pritikin Eating Plan provides plenty of food and hunger-curbing satisfaction. It is easy to follow, affordable, and helps reduce health risks.  Menu Workshop  Clinical staff conducted group or individual video education with verbal and written material and guidebook.  Patient learns that restaurant meals can sabotage health goals because they are often packed with calories, fat, sodium, and sugar. Recommendations include strategies to plan  ahead and to communicate with the manager, chef, or server to help order a healthier meal.  Planning Your Eating Strategy  Clinical staff conducted group or individual video education with verbal and written material and guidebook.  Patient learns about the Pritikin Eating Plan and its benefit of reducing the risk of disease. The Pritikin Eating Plan does not focus on calories. Instead, it emphasizes high-quality, nutrient-rich foods. By knowing the characteristics of the foods, we choose, we can determine their calorie density and make informed decisions.  Targeting Your Nutrition Priorities  Clinical staff conducted group or individual video education with verbal and written material and guidebook.  Patient learns that lifestyle habits have a tremendous impact on disease risk and progression. This video provides eating and physical activity recommendations based on your personal health goals, such as reducing LDL cholesterol, losing weight, preventing or controlling type 2 diabetes, and reducing high blood pressure.  Vitamins and Minerals  Clinical staff conducted group or individual video education with verbal and written material and guidebook.  Patient learns different ways to obtain key vitamins and minerals, including through a recommended healthy diet. It is important to discuss all supplements you take with your doctor.   Healthy Mind-Set    Smoking Cessation  Clinical staff conducted group or individual video education with verbal and written material and guidebook.  Patient learns that cigarette smoking and tobacco addiction pose a serious health risk which affects millions of people. Stopping smoking will significantly reduce the risk of heart disease, lung disease, and many forms of cancer. Recommended strategies for quitting are covered, including working with your doctor to develop a successful plan.  Culinary   Becoming a Set designer conducted group or  individual video education with verbal and written material and guidebook.  Patient learns that cooking at home can be healthy, cost-effective, quick, and puts them in control. Keys to cooking healthy recipes will include looking at your recipe, assessing your equipment needs, planning ahead, making it simple, choosing cost-effective seasonal ingredients, and limiting the use of added fats, salts, and sugars.  Cooking - Breakfast and Snacks  Clinical staff conducted group or individual video education with verbal and written material and guidebook.  Patient learns how important breakfast is to satiety and nutrition through the entire day. Recommendations include key foods to eat during breakfast to help stabilize blood sugar levels and to prevent overeating at meals later in the day. Planning ahead is also a key component.  Cooking - Educational psychologist conducted group or individual video education with verbal and written material and guidebook.  Patient learns eating strategies to improve overall health, including an approach to cook more at home. Recommendations include thinking of animal protein as a side on your plate rather than center stage and focusing instead on lower calorie dense options like vegetables, fruits, whole grains, and plant-based proteins, such as beans. Making sauces in large quantities to freeze for later and leaving the skin on your vegetables are also recommended to maximize your experience.  Cooking - Healthy Salads and Dressing Clinical staff conducted group or individual video education with verbal and written material and guidebook.  Patient learns that vegetables, fruits, whole grains, and legumes are the foundations of the Pritikin Eating Plan. Recommendations include how to incorporate each of these in flavorful and healthy salads, and how to create homemade salad dressings. Proper handling of ingredients is also covered. Cooking - Soups and [employer] - Soups and Desserts Clinical staff conducted group or individual video education with verbal and written material and guidebook.  Patient learns that Pritikin soups and desserts make for easy, nutritious, and delicious snacks and meal components that are low in sodium, fat, sugar, and calorie density, while high in vitamins, minerals, and filling fiber. Recommendations include simple and healthy ideas for soups and desserts.   Overview     The Pritikin Solution Program Overview Clinical staff conducted group or individual video education with verbal and written material and guidebook.  Patient learns that the results of the Pritikin Program have been documented in more than 100 articles published in peer-reviewed journals, and the benefits include reducing risk factors for (and, in some cases, even reversing) high cholesterol, high blood pressure, type 2 diabetes, obesity, and more! An overview of the three key pillars of the Pritikin Program will be covered: eating well, doing regular exercise, and having a healthy mind-set.  WORKSHOPS  Exercise: Exercise Basics: Building Your Action Plan Clinical staff led group instruction and group discussion with PowerPoint presentation and patient guidebook. To enhance the learning environment the use of posters, models and videos may be added. At the conclusion of this workshop, patients will comprehend the difference between physical activity and exercise, as well as the benefits of incorporating both, into their routine. Patients will understand the FITT (Frequency, Intensity, Time, and Type) principle and how to use it to build an exercise action plan. In addition, safety concerns and other considerations for exercise and cardiac rehab will be addressed by the presenter. The purpose of this lesson is to promote a comprehensive and effective weekly exercise routine in order to improve patients' overall level of fitness.   Managing Heart  Disease: Your Path to a Healthier Heart Clinical staff led group instruction and group discussion with PowerPoint presentation and patient guidebook. To enhance the learning environment the use of posters, models and videos may be added.At the conclusion of this workshop, patients will understand the anatomy and physiology of the heart. Additionally, they will understand how Pritikin's three pillars impact the risk factors, the progression, and the management of  heart disease.  The purpose of this lesson is to provide a high-level overview of the heart, heart disease, and how the Pritikin lifestyle positively impacts risk factors.  Exercise Biomechanics Clinical staff led group instruction and group discussion with PowerPoint presentation and patient guidebook. To enhance the learning environment the use of posters, models and videos may be added. Patients will learn how the structural parts of their bodies function and how these functions impact their daily activities, movement, and exercise. Patients will learn how to promote a neutral spine, learn how to manage pain, and identify ways to improve their physical movement in order to promote healthy living. The purpose of this lesson is to expose patients to common physical limitations that impact physical activity. Participants will learn practical ways to adapt and manage aches and pains, and to minimize their effect on regular exercise. Patients will learn how to maintain good posture while sitting, walking, and lifting.  Balance Training and Fall Prevention  Clinical staff led group instruction and group discussion with PowerPoint presentation and patient guidebook. To enhance the learning environment the use of posters, models and videos may be added. At the conclusion of this workshop, patients will understand the importance of their sensorimotor skills (vision, proprioception, and the vestibular system) in maintaining their ability to  balance as they age. Patients will apply a variety of balancing exercises that are appropriate for their current level of function. Patients will understand the common causes for poor balance, possible solutions to these problems, and ways to modify their physical environment in order to minimize their fall risk. The purpose of this lesson is to teach patients about the importance of maintaining balance as they age and ways to minimize their risk of falling.  WORKSHOPS   Nutrition:  Fueling a Ship broker led group instruction and group discussion with PowerPoint presentation and patient guidebook. To enhance the learning environment the use of posters, models and videos may be added. Patients will review the foundational principles of the Pritikin Eating Plan and understand what constitutes a serving size in each of the food groups. Patients will also learn Pritikin-friendly foods that are better choices when away from home and review make-ahead meal and snack options. Calorie density will be reviewed and applied to three nutrition priorities: weight maintenance, weight loss, and weight gain. The purpose of this lesson is to reinforce (in a group setting) the key concepts around what patients are recommended to eat and how to apply these guidelines when away from home by planning and selecting Pritikin-friendly options. Patients will understand how calorie density may be adjusted for different weight management goals.  Mindful Eating  Clinical staff led group instruction and group discussion with PowerPoint presentation and patient guidebook. To enhance the learning environment the use of posters, models and videos may be added. Patients will briefly review the concepts of the Pritikin Eating Plan and the importance of low-calorie dense foods. The concept of mindful eating will be introduced as well as the importance of paying attention to internal hunger signals. Triggers for non-hunger  eating and techniques for dealing with triggers will be explored. The purpose of this lesson is to provide patients with the opportunity to review the basic principles of the Pritikin Eating Plan, discuss the value of eating mindfully and how to measure internal cues of hunger and fullness using the Hunger Scale. Patients will also discuss reasons for non-hunger eating and learn strategies to use for controlling emotional eating.  Targeting Your Nutrition  Priorities Clinical staff led group instruction and group discussion with PowerPoint presentation and patient guidebook. To enhance the learning environment the use of posters, models and videos may be added. Patients will learn how to determine their genetic susceptibility to disease by reviewing their family history. Patients will gain insight into the importance of diet as part of an overall healthy lifestyle in mitigating the impact of genetics and other environmental insults. The purpose of this lesson is to provide patients with the opportunity to assess their personal nutrition priorities by looking at their family history, their own health history and current risk factors. Patients will also be able to discuss ways of prioritizing and modifying the Pritikin Eating Plan for their highest risk areas  Menu  Clinical staff led group instruction and group discussion with PowerPoint presentation and patient guidebook. To enhance the learning environment the use of posters, models and videos may be added. Using menus brought in from [employer], or printed from [employer], patients will apply the Pritikin dining out guidelines that were presented in the [employer] video. Patients will also be able to practice these guidelines in a variety of provided scenarios. The purpose of this lesson is to provide patients with the opportunity to practice hands-on learning of the Pritikin Dining Out guidelines with actual menus and practice  scenarios.  Label Reading Clinical staff led group instruction and group discussion with PowerPoint presentation and patient guidebook. To enhance the learning environment the use of posters, models and videos may be added. Patients will review and discuss the Pritikin label reading guidelines presented in Pritikin's Label Reading Educational series video. Using fool labels brought in from local grocery stores and markets, patients will apply the label reading guidelines and determine if the packaged food meet the Pritikin guidelines. The purpose of this lesson is to provide patients with the opportunity to review, discuss, and practice hands-on learning of the Pritikin Label Reading guidelines with actual packaged food labels. Cooking School  Pritikin's [employer] are designed to teach patients ways to prepare quick, simple, and affordable recipes at home. The importance of nutrition's role in chronic disease risk reduction is reflected in its emphasis in the overall Pritikin program. By learning how to prepare essential core Pritikin Eating Plan recipes, patients will increase control over what they eat; be able to customize the flavor of foods without the use of added salt, sugar, or fat; and improve the quality of the food they consume. By learning a set of core recipes which are easily assembled, quickly prepared, and affordable, patients are more likely to prepare more healthy foods at home. These workshops focus on convenient breakfasts, simple entres, side dishes, and desserts which can be prepared with minimal effort and are consistent with nutrition recommendations for cardiovascular risk reduction. Cooking [employer] are taught by a Armed forces logistics/support/administrative officer (RD) who has been trained by the [employer]. The chef or RD has a clear understanding of the importance of minimizing - if not completely eliminating - added fat, sugar, and sodium in recipes. Throughout  the series of Cooking School Workshop sessions, patients will learn about healthy ingredients and efficient methods of cooking to build confidence in their capability to prepare    Cooking School weekly topics:  Adding Flavor- Sodium-Free  Fast and Healthy Breakfasts  Powerhouse Plant-Based Proteins  Satisfying Salads and Dressings  Simple Sides and Sauces  International Cuisine-Spotlight on the [employer] Desserts  [employer]  Efficiency Cooking - Meals in a Snap  Tasty Appetizers and Snacks  Comforting Weekend Breakfasts  One-Pot Wonders   Fast Evening Meals  Landscape architect Your Pritikin Plate  WORKSHOPS   Healthy Mindset (Psychosocial):  Focused Goals, Sustainable Changes Clinical staff led group instruction and group discussion with PowerPoint presentation and patient guidebook. To enhance the learning environment the use of posters, models and videos may be added. Patients will be able to apply effective goal setting strategies to establish at least one personal goal, and then take consistent, meaningful action toward that goal. They will learn to identify common barriers to achieving personal goals and develop strategies to overcome them. Patients will also gain an understanding of how our mind-set can impact our ability to achieve goals and the importance of cultivating a positive and growth-oriented mind-set. The purpose of this lesson is to provide patients with a deeper understanding of how to set and achieve personal goals, as well as the tools and strategies needed to overcome common obstacles which may arise along the way.  From Head to Heart: The Power of a Healthy Outlook  Clinical staff led group instruction and group discussion with PowerPoint presentation and patient guidebook. To enhance the learning environment the use of posters, models and videos may be added. Patients will be able to recognize and describe the impact of emotions and  mood on physical health. They will discover the importance of self-care and explore self-care practices which may work for them. Patients will also learn how to utilize the 4 C's to cultivate a healthier outlook and better manage stress and challenges. The purpose of this lesson is to demonstrate to patients how a healthy outlook is an essential part of maintaining good health, especially as they continue their cardiac rehab journey.  Healthy Sleep for a Healthy Heart Clinical staff led group instruction and group discussion with PowerPoint presentation and patient guidebook. To enhance the learning environment the use of posters, models and videos may be added. At the conclusion of this workshop, patients will be able to demonstrate knowledge of the importance of sleep to overall health, well-being, and quality of life. They will understand the symptoms of, and treatments for, common sleep disorders. Patients will also be able to identify daytime and nighttime behaviors which impact sleep, and they will be able to apply these tools to help manage sleep-related challenges. The purpose of this lesson is to provide patients with a general overview of sleep and outline the importance of quality sleep. Patients will learn about a few of the most common sleep disorders. Patients will also be introduced to the concept of "sleep hygiene," and discover ways to self-manage certain sleeping problems through simple daily behavior changes. Finally, the workshop will motivate patients by clarifying the links between quality sleep and their goals of heart-healthy living.   Recognizing and Reducing Stress Clinical staff led group instruction and group discussion with PowerPoint presentation and patient guidebook. To enhance the learning environment the use of posters, models and videos may be added. At the conclusion of this workshop, patients will be able to understand the types of stress reactions, differentiate between  acute and chronic stress, and recognize the impact that chronic stress has on their health. They will also be able to apply different coping mechanisms, such as reframing negative self-talk. Patients will have the opportunity to practice a variety of stress management techniques, such as deep abdominal breathing, progressive muscle relaxation, and/or guided imagery.  The purpose of  this lesson is to educate patients on the role of stress in their lives and to provide healthy techniques for coping with it.  Learning Barriers/Preferences:  Learning Barriers/Preferences - 10/06/23 1417       Learning Barriers/Preferences   Learning Barriers None    Learning Preferences Skilled Demonstration;Video          Education Topics:  Knowledge Questionnaire Score:  Knowledge Questionnaire Score - 10/06/23 1414       Knowledge Questionnaire Score   Pre Score 24/24          Core Components/Risk Factors/Patient Goals at Admission:  Personal Goals and Risk Factors at Admission - 10/06/23 1207       Core Components/Risk Factors/Patient Goals on Admission    Weight Management Yes;Obesity;Weight Loss    Intervention Obesity: Provide education and appropriate resources to help participant work on and attain dietary goals.;Weight Management/Obesity: Establish reasonable short term and long term weight goals.    Admit Weight 205 lb 4 oz (93.1 kg)    Expected Outcomes Short Term: Continue to assess and modify interventions until short term weight is achieved;Long Term: Adherence to nutrition and physical activity/exercise program aimed toward attainment of established weight goal;Weight Loss: Understanding of general recommendations for a balanced deficit meal plan, which promotes 1-2 lb weight loss per week and includes a negative energy balance of [phone] kcal/d    Diabetes Yes    Intervention Provide education about signs/symptoms and action to take for hypo/hyperglycemia.;Provide education about  proper nutrition, including hydration, and aerobic/resistive exercise prescription along with prescribed medications to achieve blood glucose in normal ranges: Fasting glucose 65-99 mg/dL    Expected Outcomes Short Term: Participant verbalizes understanding of the signs/symptoms and immediate care of hyper/hypoglycemia, proper foot care and importance of medication, aerobic/resistive exercise and nutrition plan for blood glucose control.;Long Term: Attainment of HbA1C < 7%.    Hypertension Yes    Intervention Provide education on lifestyle modifcations including regular physical activity/exercise, weight management, moderate sodium restriction and increased consumption of fresh fruit, vegetables, and low fat dairy, alcohol moderation, and smoking cessation.;Monitor prescription use compliance.    Expected Outcomes Short Term: Continued assessment and intervention until BP is < 140/74mm HG in hypertensive participants. < 130/31mm HG in hypertensive participants with diabetes, heart failure or chronic kidney disease.;Long Term: Maintenance of blood pressure at goal levels.    Lipids Yes    Intervention Provide education and support for participant on nutrition & aerobic/resistive exercise along with prescribed medications to achieve LDL 70mg , HDL >40mg .    Expected Outcomes Short Term: Participant states understanding of desired cholesterol values and is compliant with medications prescribed. Participant is following exercise prescription and nutrition guidelines.;Long Term: Cholesterol controlled with medications as prescribed, with individualized exercise RX and with personalized nutrition plan. Value goals: LDL < 70mg , HDL > 40 mg.    Stress Yes    Intervention Offer individual and/or small group education and counseling on adjustment to heart disease, stress management and health-related lifestyle change. Teach and support self-help strategies.;Refer participants experiencing significant psychosocial  distress to appropriate mental health specialists for further evaluation and treatment. When possible, include family members and significant others in education/counseling sessions.    Expected Outcomes Short Term: Participant demonstrates changes in health-related behavior, relaxation and other stress management skills, ability to obtain effective social support, and compliance with psychotropic medications if prescribed.;Long Term: Emotional wellbeing is indicated by absence of clinically significant psychosocial distress or social isolation.  Core Components/Risk Factors/Patient Goals Review:    Core Components/Risk Factors/Patient Goals at Discharge (Final Review):    ITP Comments:  ITP Comments     Row Name 10/06/23 1038           ITP Comments Tracy Bihari, MD: Medical director. Intorduction to the [employer] Initial orientation packet reviewed with the patient.          Comments: Tracy Kennedy attended orientation for the cardiac rehabilitation program on  10/06/2023  to perform initial intake and exercise walk test. She was introduced to the [employer] education and orientation packet was reviewed. Completed 6-minute walk test, measurements, initial ITP, and exercise prescription. Blood pressure was mildly elevated at rest. Telemetry-normal sinus rhythm, asymptomatic.   Service time was from 1036 to 1244. Arnoldo CHRISTELLA Gal, MS, ACSM CEP 10/06/2023 [phone]

## 2023-10-06 NOTE — Progress Notes (Addendum)
 Cardiac Rehab Medication Review by a Nurse   Does the patient  feel that his/her medications are working for him/her?  yes   Has the patient been experiencing any side effects to the medications prescribed?  no   Does the patient measure his/her own blood pressure or blood glucose at home?  yes    Does the patient have any problems obtaining medications due to transportation or finances?   no   Understanding of regimen: good Understanding of indications: good Potential of compliance: good     Nurse comments: Pt states, I have no questions about my medications at this time except whether or not I am taking valsartan .  I will confirm once I get home and let you know when I come back if I am or not.

## 2023-10-14 ENCOUNTER — Encounter (HOSPITAL_COMMUNITY)
Admission: RE | Admit: 2023-10-14 | Discharge: 2023-10-14 | Disposition: A | Discharge status: Home / Self Care | Source: Ambulatory Visit | Attending: Cardiology | Admitting: Cardiology

## 2023-10-14 DIAGNOSIS — Z952 Presence of prosthetic heart valve: Secondary | ICD-10-CM | POA: Diagnosis present

## 2023-10-14 DIAGNOSIS — Z48812 Encounter for surgical aftercare following surgery on the circulatory system: Secondary | ICD-10-CM | POA: Diagnosis present

## 2023-10-14 NOTE — Progress Notes (Signed)
 Daily Session Note  Patient Details  Name: Tracy Kennedy MRN: [id number] Date of Birth: October 18, 1950 Referring Provider:   Flowsheet Row INTENSIVE CARDIAC REHAB ORIENT from 10/06/2023 in [facility]  Referring Provider Tracy Fusi, MD  Tracy Wilbert SAUNDERS, MD]    Encounter Date: 10/14/2023  Check In:  Session Check In - 10/14/23 1053       Check-In   Supervising physician immediately available to respond to emergencies [facility] MD immediately available    Physician(s) Tracy Beauvais, NP    Location [facility]    Staff Present Tracy Music, RN, Tracy Gal, MS, ACSM-CEP, Exercise Physiologist;Tracy Whitaker, RN, Tracy Parkins, MS, ACSM-CEP, CCRP, Exercise Physiologist;Tracy Kennedy BS, ACSM-CEP, Exercise Physiologist;Tracy Fayette, MS, Exercise Physiologist    Virtual Visit No    Medication changes reported     Yes    Comments Alendronate Sodium 70 mg (once per week)    Fall or balance concerns reported    No    Tobacco Cessation No Change    Warm-up and Cool-down Performed as group-led instruction    Resistance Training Performed No    VAD Patient? No    PAD/SET Patient? No      Pain Assessment   Currently in Pain? No/denies    Multiple Pain Sites No          Capillary Blood Glucose: No results found for this or any previous visit (from the past 24 hours).   Exercise Prescription Changes - 10/14/23 1034       Response to Exercise   Blood Pressure (Admit) 140/72    Blood Pressure (Exercise) 146/60    Blood Pressure (Exit) 126/72    Heart Rate (Admit) 63 bpm    Heart Rate (Exercise) 85 bpm    Heart Rate (Exit) 66 bpm    Rating of Perceived Exertion (Exercise) 12    Symptoms None    Comments Off to a good start with exercise.    Duration Continue with 30 min of aerobic exercise without signs/symptoms of physical distress.    Intensity THRR unchanged      Progression   Progression  Continue to progress workloads to maintain intensity without signs/symptoms of physical distress.    Average METs 1.9      Resistance Training   Training Prescription No    Weight Relaxation day, no weights.      Interval Training   Interval Training No      Recumbant Bike   Level 1    RPM 43    Watts 11    Minutes 15    METs 1.8      NuStep   Level 1    SPM 75    Minutes 15    METs 2          Social History   Tobacco Use  Smoking Status Never  Smokeless Tobacco Never    Goals Met:  Exercise tolerated well No report of concerns or symptoms today  Goals Unmet:  Not Applicable  Comments: Tracy Kennedy started the Intensive Cardiac Rehab program today and tolerated low intensity exercise well without symptoms. She is very eager to make lifestyle changes and enthusiastic about participating in the program. She has several goals including establishing an exercise routine, cooking and eating more heart healthy meals, and seeking health ways to cope personal issues like meditation, yoga, and Tai Chi. Telemetry showed normal sinus rhythm, heart rate and blood pressure  within normal limits.   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

## 2023-10-15 ENCOUNTER — Ambulatory Visit: Payer: Self-pay | Admitting: Cardiology

## 2023-10-15 LAB — GLUCOSE, CAPILLARY
Glucose-Capillary: 125 mg/dL — ABNORMAL HIGH (ref 70–99)
Glucose-Capillary: 111 mg/dL — ABNORMAL HIGH (ref 70–99)

## 2023-10-16 ENCOUNTER — Encounter (HOSPITAL_COMMUNITY)
Admission: RE | Admit: 2023-10-16 | Discharge: 2023-10-16 | Disposition: A | Discharge status: Home / Self Care | Source: Ambulatory Visit | Attending: Cardiology | Admitting: Cardiology

## 2023-10-16 DIAGNOSIS — Z952 Presence of prosthetic heart valve: Secondary | ICD-10-CM

## 2023-10-16 DIAGNOSIS — Z48812 Encounter for surgical aftercare following surgery on the circulatory system: Secondary | ICD-10-CM | POA: Diagnosis not present

## 2023-10-19 ENCOUNTER — Encounter (HOSPITAL_COMMUNITY)
Admission: RE | Admit: 2023-10-19 | Discharge: 2023-10-19 | Disposition: A | Discharge status: Home / Self Care | Source: Ambulatory Visit | Attending: Cardiology | Admitting: Cardiology

## 2023-10-19 DIAGNOSIS — Z952 Presence of prosthetic heart valve: Secondary | ICD-10-CM

## 2023-10-19 DIAGNOSIS — Z48812 Encounter for surgical aftercare following surgery on the circulatory system: Secondary | ICD-10-CM | POA: Diagnosis not present

## 2023-10-19 NOTE — Progress Notes (Signed)
 Cardiac Individual Treatment Plan  Patient Details  Name: Tracy Kennedy MRN: [id number] Date of Birth: 18-Jul-1950 Referring Provider:   Flowsheet Row INTENSIVE CARDIAC REHAB ORIENT from 10/06/2023 in [facility], V[facility], [facility]  Referring Provider Uvaldo Fusi, MD  Leldon Wilbert SAUNDERS, MD]    Initial Encounter Date:  Flowsheet Row INTENSIVE CARDIAC REHAB ORIENT from 10/06/2023 in [facility] for Heart, Vascular, & Lung Health  Date 10/06/23    Visit Diagnosis: 08/11/23 S/P TAVR (transcatheter aortic valve replacement)  Patient's Home Medications on Admission:  Current Outpatient Medications:    ACETAMINOPHEN PO, Take by mouth 2 (two) times daily., Disp: , Rfl:    albuterol  (PROVENTIL  HFA;VENTOLIN  HFA) 108 (90 Base) MCG/ACT inhaler, Inhale 2 puffs into the lungs every 6 (six) hours as needed., Disp: 1 Inhaler, Rfl: 1   Ascorbic Acid (VITAMIN C) 1000 MG tablet, Take 1,000 mg by mouth daily., Disp: , Rfl:    aspirin 81 MG tablet, Take 81 mg by mouth daily., Disp: , Rfl:    BIOTIN PO, Take by mouth daily., Disp: , Rfl:    Blood Glucose Monitoring Suppl (ONETOUCH VERIO) W/DEVICE KIT, 1 Device by Does not apply route AC breakfast., Disp: 1 kit, Rfl: 12   CALCIUM PO, Take by mouth., Disp: , Rfl:    cetirizine  (ZYRTEC ) 10 MG tablet, TAKE 1 TABLET(10 MG) BY MOUTH DAILY (Patient not taking: Reported on 10/06/2023), Disp: 90 tablet, Rfl: 0   chlorthalidone  (HYGROTON ) 25 MG tablet, TAKE 1 TABLET(25 MG) BY MOUTH DAILY (Patient taking differently: Take 12.5 mg by mouth daily. TAKE 1 TABLET(12.5 MG) BY MOUTH DAILY), Disp: 90 tablet, Rfl: 3   Cholecalciferol (VITAMIN D3) 50 MCG (2000 UT) capsule, Take 2,000 Units by mouth daily., Disp: , Rfl:    clobetasol (TEMOVATE) 0.05 % external solution, , Disp: , Rfl:    clotrimazole -betamethasone  (LOTRISONE ) cream, Apply 1 application topically 2 (two) times daily., Disp: 30 g, Rfl: 0   colestipol  (COLESTID) 1 g tablet, , Disp: , Rfl:    Cyanocobalamin (VITAMIN B-12 PO), Take by mouth. (Patient not taking: Reported on 10/06/2023), Disp: , Rfl:    ezetimibe (ZETIA) 10 MG tablet, Take 10 mg by mouth daily., Disp: , Rfl:    famotidine (PEPCID) 20 MG tablet, Take by mouth., Disp: , Rfl:    gabapentin (NEURONTIN) 300 MG capsule, Take 300 mg by mouth 2 (two) times daily., Disp: , Rfl:    GLUCOSAMINE PO, Take by mouth., Disp: , Rfl:    ipratropium (ATROVENT ) 0.03 % nasal spray, Place 2 sprays into both nostrils 2 (two) times daily. (Patient taking differently: Place 2 sprays into both nostrils as needed for rhinitis.), Disp: 30 mL, Rfl: 12   Lancet Devices (LANCING DEVICE) MISC, Use to test blood sugar daily. One box, Disp: 1 each, Rfl: 2   Lancets MISC, Use to test blood sugar daily, Disp: 100 each, Rfl: 2   loratadine (CLARITIN) 10 MG tablet, Take by mouth., Disp: , Rfl:    losartan (COZAAR) 100 MG tablet, , Disp: , Rfl:    magnesium oxide (MAG-OX) 400 (240 Mg) MG tablet, Take 400 mg by mouth daily., Disp: , Rfl:    montelukast (SINGULAIR) 10 MG tablet, Take 10 mg by mouth at bedtime., Disp: , Rfl:    Multiple Vitamin (MULTIVITAMIN) tablet, Take 1 tablet by mouth daily., Disp: , Rfl:    niacin (NIASPAN) 750 MG CR tablet, Take 750 mg by mouth  at bedtime., Disp: , Rfl:    omeprazole  (PRILOSEC) 20 MG capsule, TAKE 1 CAPSULE(20 MG) BY MOUTH DAILY, Disp: 90 capsule, Rfl: 3   ONETOUCH VERIO test strip, TEST AS DIRECTED, Disp: 100 each, Rfl: 1   scopolamine  (TRANSDERM-SCOP) 1 MG/3DAYS, Place 1 patch (1.5 mg total) onto the skin every 3 (three) days. (Patient not taking: Reported on 10/06/2023), Disp: 10 patch, Rfl: 0   spironolactone (ALDACTONE) 25 MG tablet, Take 0.5 tablets (12.5 mg total) by mouth daily., Disp: , Rfl:    triamcinolone  (KENALOG ) 0.025 % ointment, Apply 1 application topically 2 (two) times daily., Disp: 30 g, Rfl: 0   Turmeric (QC TUMERIC COMPLEX PO), Take 1,000 mg by mouth daily.,  Disp: , Rfl:    valsartan  (DIOVAN ) 320 MG tablet, Take 1 tablet (320 mg total) by mouth daily., Disp: 90 tablet, Rfl: 3   verapamil  (VERELAN  PM) 180 MG 24 hr capsule, Take 180 mg by mouth daily., Disp: , Rfl: 4   vitamin E 1000 UNIT capsule, Take 1,000 Units by mouth daily., Disp: , Rfl:    WELCHOL  625 MG tablet, TAKE 3 TABLETS BY MOUTH TWICE DAILY WITH A MEAL (Patient not taking: Reported on 10/06/2023), Disp: 540 tablet, Rfl: 3  Current Facility-Administered Medications:    betamethasone  acetate-betamethasone  sodium phosphate (CELESTONE ) injection 3 mg, 3 mg, Intramuscular, Once, Janit Thresa HERO, DPM  Past Medical History: Past Medical History:  Diagnosis Date   Asthma    Asthma    childhood   Cardiac murmur    Diabetes mellitus    HTN (hypertension)    Microalbuminuria    Obesity     Tobacco Use: Social History   Tobacco Use  Smoking Status Never  Smokeless Tobacco Never    Labs: Review Flowsheet  More data exists      Latest Ref Rng & Units 01/15/2016 04/29/2016 12/02/2016 03/10/2017 06/09/2017  Labs for ITP Cardiac and Pulmonary Rehab  Cholestrol 100 - 199 mg/dL 842  744  799  773  809   LDL (calc) 0 - 99 mg/dL 80  821  879  860  883   HDL-C >39 mg/dL 53  53  56  53  48   Trlycerides 0 - 149 mg/dL 881  879  879  831  871   Hemoglobin A1c 4.8 - 5.6 % 5.8  5.7  6.1  6.0  6.0     Capillary Blood Glucose: Lab Results  Component Value Date   GLUCAP 111 (H) 10/14/2023   GLUCAP 125 (H) 10/14/2023     Exercise Target Goals: Exercise Program Goal: Individual exercise prescription set using results from initial 6 min walk test and THRR while considering  patient's activity barriers and safety.   Exercise Prescription Goal: Initial exercise prescription builds to 30-45 minutes a day of aerobic activity, 2-3 days per week.  Home exercise guidelines will be given to patient during program as part of exercise prescription that the participant will acknowledge.  Activity  Barriers & Risk Stratification:  Activity Barriers & Cardiac Risk Stratification - 10/06/23 1207       Activity Barriers & Cardiac Risk Stratification   Activity Barriers Back Problems;History of Falls;Balance Concerns;Other (comment)    Comments Pinched nerve- right leg. Pain in left hip down leg. Chronic low back pain.    Cardiac Risk Stratification High          6 Minute Walk:  6 Minute Walk     Row Name 10/06/23 1225  6 Minute Walk   Phase Initial     Distance 1168 feet     Walk Time 6 minutes     # of Rest Breaks 0     MPH 2.21     METS 2.21     RPE 11     Perceived Dyspnea  1     VO2 Peak 7.73     Symptoms Yes (comment)     Comments Mild shortness of breath.     Resting HR 67 bpm     Resting BP 146/84     Resting Oxygen Saturation  95 %     Exercise Oxygen Saturation  during 6 min walk 97 %     Max Ex. HR 93 bpm     Max Ex. BP 168/68     2 Minute Post BP 134/70        Oxygen Initial Assessment:   Oxygen Re-Evaluation:   Oxygen Discharge (Final Oxygen Re-Evaluation):   Initial Exercise Prescription:  Initial Exercise Prescription - 10/06/23 1400       Date of Initial Exercise RX and Referring Provider   Date 10/06/23    Referring Provider Uvaldo Fusi, MD   Shlomo Wilbert SAUNDERS, MD   Expected Discharge Date 01/01/24      Recumbant Bike   Level 1    Watts 10    Minutes 15    METs 2.2      NuStep   Level 1    SPM 85    Minutes 15    METs 2.2      Prescription Details   Frequency (times per week) 3    Duration Progress to 30 minutes of continuous aerobic without signs/symptoms of physical distress      Intensity   THRR 40-80% of Max Heartrate 60-119    Ratings of Perceived Exertion 11-13    Perceived Dyspnea 0-4      Progression   Progression Continue to progress workloads to maintain intensity without signs/symptoms of physical distress.      Resistance Training   Training Prescription Yes    Weight 2 lbs    Reps 10-15           Perform Capillary Blood Glucose checks as needed.  Exercise Prescription Changes:   Exercise Prescription Changes     Row Name 10/14/23 1034             Response to Exercise   Blood Pressure (Admit) 140/72       Blood Pressure (Exercise) 146/60       Blood Pressure (Exit) 126/72       Heart Rate (Admit) 63 bpm       Heart Rate (Exercise) 85 bpm       Heart Rate (Exit) 66 bpm       Rating of Perceived Exertion (Exercise) 12       Symptoms None       Comments Off to a good start with exercise.       Duration Continue with 30 min of aerobic exercise without signs/symptoms of physical distress.       Intensity THRR unchanged         Progression   Progression Continue to progress workloads to maintain intensity without signs/symptoms of physical distress.       Average METs 1.9         Resistance Training   Training Prescription No       Weight Relaxation day, no weights.  Interval Training   Interval Training No         Recumbant Bike   Level 1       RPM 43       Watts 11       Minutes 15       METs 1.8         NuStep   Level 1       SPM 75       Minutes 15       METs 2          Exercise Comments:   Exercise Comments     Row Name 10/14/23 1135           Exercise Comments Mirka tolerated low intensity exercise well without symptoms. Oriented her to the exercise equipment and stretching routine. Reviewed METs activity chart. Stretching handout given.          Exercise Goals and Review:   Exercise Goals     Row Name 10/06/23 1207             Exercise Goals   Increase Physical Activity Yes       Intervention Provide advice, education, support and counseling about physical activity/exercise needs.;Develop an individualized exercise prescription for aerobic and resistive training based on initial evaluation findings, risk stratification, comorbidities and participant's personal goals.       Expected Outcomes Short Term: Attend rehab  on a regular basis to increase amount of physical activity.;Long Term: Exercising regularly at least 3-5 days a week.;Long Term: Add in home exercise to make exercise part of routine and to increase amount of physical activity.       Increase Strength and Stamina Yes       Intervention Provide advice, education, support and counseling about physical activity/exercise needs.;Develop an individualized exercise prescription for aerobic and resistive training based on initial evaluation findings, risk stratification, comorbidities and participant's personal goals.       Expected Outcomes Short Term: Increase workloads from initial exercise prescription for resistance, speed, and METs.;Short Term: Perform resistance training exercises routinely during rehab and add in resistance training at home;Long Term: Improve cardiorespiratory fitness, muscular endurance and strength as measured by increased METs and functional capacity ( )       Able to understand and use rate of perceived exertion (RPE) scale Yes       Intervention Provide education and explanation on how to use RPE scale       Expected Outcomes Short Term: Able to use RPE daily in rehab to express subjective intensity level;Long Term:  Able to use RPE to guide intensity level when exercising independently       Knowledge and understanding of Target Heart Rate Range (THRR) Yes       Intervention Provide education and explanation of THRR including how the numbers were predicted and where they are located for reference       Expected Outcomes Short Term: Able to state/look up THRR;Short Term: Able to use daily as guideline for intensity in rehab;Long Term: Able to use THRR to govern intensity when exercising independently       Able to check pulse independently Yes       Intervention Provide education and demonstration on how to check pulse in carotid and radial arteries.;Review the importance of being able to check your own pulse for safety during  independent exercise       Expected Outcomes Short Term: Able to explain why pulse checking is important  during independent exercise;Long Term: Able to check pulse independently and accurately       Understanding of Exercise Prescription Yes       Intervention Provide education, explanation, and written materials on patient's individual exercise prescription       Expected Outcomes Short Term: Able to explain program exercise prescription;Long Term: Able to explain home exercise prescription to exercise independently          Exercise Goals Re-Evaluation :  Exercise Goals Re-Evaluation     Row Name 10/14/23 1135             Exercise Goal Re-Evaluation   Exercise Goals Review Increase Physical Activity;Increase Strength and Stamina;Able to understand and use rate of perceived exertion (RPE) scale       Comments Sheridyn was able to understand and use RPE scale appropriately.       Expected Outcomes Progress workloads as tolerated to help increase cardiorespiratory fitness.          Discharge Exercise Prescription (Final Exercise Prescription Changes):  Exercise Prescription Changes - 10/14/23 1034       Response to Exercise   Blood Pressure (Admit) 140/72    Blood Pressure (Exercise) 146/60    Blood Pressure (Exit) 126/72    Heart Rate (Admit) 63 bpm    Heart Rate (Exercise) 85 bpm    Heart Rate (Exit) 66 bpm    Rating of Perceived Exertion (Exercise) 12    Symptoms None    Comments Off to a good start with exercise.    Duration Continue with 30 min of aerobic exercise without signs/symptoms of physical distress.    Intensity THRR unchanged      Progression   Progression Continue to progress workloads to maintain intensity without signs/symptoms of physical distress.    Average METs 1.9      Resistance Training   Training Prescription No    Weight Relaxation day, no weights.      Interval Training   Interval Training No      Recumbant Bike   Level 1    RPM 43     Watts 11    Minutes 15    METs 1.8      NuStep   Level 1    SPM 75    Minutes 15    METs 2          Nutrition:  Target Goals: Understanding of nutrition guidelines, daily intake of sodium 1500mg , cholesterol 200mg , calories 30% from fat and 7% or less from saturated fats, daily to have 5 or more servings of fruits and vegetables.  Biometrics:  Pre Biometrics - 10/06/23 1037       Pre Biometrics   Waist Circumference 45.75 inches    Hip Circumference 49 inches    Waist to Hip Ratio 0.93 %    Triceps Skinfold 43 mm    % Body Fat 51.2 %    Grip Strength 14 kg    Flexibility --   Not performed. Chronic low back pain.   Single Leg Stand 7.75 seconds           Nutrition Therapy Plan and Nutrition Goals:   Nutrition Assessments:  MEDIFICTS Score Key: >=70 Need to make dietary changes  40-70 Heart Healthy Diet <= 40 Therapeutic Level Cholesterol Diet   Flowsheet Row INTENSIVE CARDIAC REHAB from 10/14/2023 in [facility] for Heart, Vascular, & Lung Health  Picture Your Plate Total Score on Admission 52   Picture  Your Plate Scores: <59 Unhealthy dietary pattern with much room for improvement. 41-50 Dietary pattern unlikely to meet recommendations for good health and room for improvement. 51-60 More healthful dietary pattern, with some room for improvement.  >60 Healthy dietary pattern, although there may be some specific behaviors that could be improved.    Nutrition Goals Re-Evaluation:   Nutrition Goals Re-Evaluation:   Nutrition Goals Discharge (Final Nutrition Goals Re-Evaluation):   Psychosocial: Target Goals: Acknowledge presence or absence of significant depression and/or stress, maximize coping skills, provide positive support system. Participant is able to verbalize types and ability to use techniques and skills needed for reducing stress and depression.  Initial Review & Psychosocial Screening:  Initial Psych Review &  Screening - 10/06/23 1150       Initial Review   Current issues with Current Sleep Concerns;Current Stress Concerns;Current Anxiety/Panic    Source of Stress Concerns Unable to perform yard/household activities   health concerns     Family Dynamics   Good Support System? Yes   Pt has a good support system with her sister, BIL and nieces, friends, and church   Concerns Recent loss of significant other      Barriers   Psychosocial barriers to participate in program The patient should benefit from training in stress management and relaxation.      Screening Interventions   Interventions Encouraged to exercise;To provide support and resources with identified psychosocial needs;Provide feedback about the scores to participant    Expected Outcomes Long Term Goal: Stressors or current issues are controlled or eliminated.;Short Term goal: Identification and review with participant of any Quality of Life or Depression concerns found by scoring the questionnaire.;Long Term goal: The participant improves quality of Life and PHQ9 Scores as seen by post scores and/or verbalization of changes          Quality of Life Scores:  Quality of Life - 10/06/23 1416       Quality of Life   Select Quality of Life      Quality of Life Scores   Health/Function Pre 19.71 %    Socioeconomic Pre 30 %    Psych/Spiritual Pre 21.43 %    Family Pre 20 %    GLOBAL Pre 22.45 %         Scores of 19 and below usually indicate a poorer quality of life in these areas.  A difference of  2-3 points is a clinically meaningful difference.  A difference of 2-3 points in the total score of the Quality of Life Index has been associated with significant improvement in overall quality of life, self-image, physical symptoms, and general health in studies assessing change in quality of life.  PHQ-9: Review Flowsheet  More data exists      10/06/2023 06/19/2017 06/09/2017 03/10/2017 12/02/2016  Depression screen PHQ 2/9   Decreased Interest 0 0 0 0 0  Down, Depressed, Hopeless 0 0 0 0 0  PHQ - 2 Score 0 0 0 0 0  Altered sleeping 1 - - - -  Tired, decreased energy 1 - - - -  Change in appetite 2 - - - -  Feeling bad or failure about yourself  2 - - - -  Trouble concentrating 0 - - - -  Moving slowly or fidgety/restless 0 - - - -  Suicidal thoughts 0 - - - -  PHQ-9 Score 6 - - - -  Difficult doing work/chores Somewhat difficult - - - -   Interpretation of  Total Score  Total Score Depression Severity:  1-4 = Minimal depression, 5-9 = Mild depression, 10-14 = Moderate depression, 15-19 = Moderately severe depression, 20-27 = Severe depression   Psychosocial Evaluation and Intervention:   Psychosocial Re-Evaluation:  Psychosocial Re-Evaluation     Row Name 10/15/23 1504             Psychosocial Re-Evaluation   Current issues with Current Anxiety/Panic;Current Stress Concerns;Current Sleep Concerns       Comments Sullivan has not voiced any increased concerns or stressors during exercise at cardiac rehab. Will review quality of life and PHQ9 in the near future.       Expected Outcomes Breianna will have controlled or decreased stress upon completion of cardiac rehab       Interventions Stress management education;Relaxation education;Encouraged to attend Cardiac Rehabilitation for the exercise       Continue Psychosocial Services  Follow up required by staff         Initial Review   Source of Stress Concerns Family       Comments Will continue to monitor and offer support as needed.          Psychosocial Discharge (Final Psychosocial Re-Evaluation):  Psychosocial Re-Evaluation - 10/15/23 1504       Psychosocial Re-Evaluation   Current issues with Current Anxiety/Panic;Current Stress Concerns;Current Sleep Concerns    Comments Mckinzee has not voiced any increased concerns or stressors during exercise at cardiac rehab. Will review quality of life and PHQ9 in the near future.    Expected Outcomes  Lus will have controlled or decreased stress upon completion of cardiac rehab    Interventions Stress management education;Relaxation education;Encouraged to attend Cardiac Rehabilitation for the exercise    Continue Psychosocial Services  Follow up required by staff      Initial Review   Source of Stress Concerns Family    Comments Will continue to monitor and offer support as needed.          Vocational Rehabilitation: Provide vocational rehab assistance to qualifying candidates.   Vocational Rehab Evaluation & Intervention:  Vocational Rehab - 10/06/23 1236       Initial Vocational Rehab Evaluation & Intervention   Assessment shows need for Vocational Rehabilitation No   retired         Education: Education Goals: Education classes will be provided on a weekly basis, covering required topics. Participant will state understanding/return demonstration of topics presented.    Education     Row Name 10/14/23 1100     Education   Cardiac Education Topics Pritikin   Orthoptist   Educator Dietitian   Weekly Topic One-Pot Wonders   Instruction Review Code 1- Verbalizes Understanding   Class Start Time 1146   Class Stop Time 1225   Class Time Calculation (min) 39 min    Row Name 10/16/23 1100     Education   Cardiac Education Topics Pritikin   Writer General Education   General Education Hypertension and Heart Disease   Instruction Review Code 1- Verbalizes Understanding   Class Start Time 1153   Class Stop Time 1230   Class Time Calculation (min) 37 min    Row Name 10/19/23 1000     Education   Cardiac Education Topics Pritikin   Geographical information systems officer  Psychosocial   Psychosocial Workshop Focused Goals, Sustainable Changes   Instruction Review Code 1- Verbalizes Understanding   Class Start Time 1155   Class Stop Time  1235   Class Time Calculation (min) 40 min      Core Videos: Exercise    Move It!  Clinical staff conducted group or individual video education with verbal and written material and guidebook.  Patient learns the recommended Pritikin exercise program. Exercise with the goal of living a long, healthy life. Some of the health benefits of exercise include controlled diabetes, healthier blood pressure levels, improved cholesterol levels, improved heart and lung capacity, improved sleep, and better body composition. Everyone should speak with their doctor before starting or changing an exercise routine.  Biomechanical Limitations Clinical staff conducted group or individual video education with verbal and written material and guidebook.  Patient learns how biomechanical limitations can impact exercise and how we can mitigate and possibly overcome limitations to have an impactful and balanced exercise routine.  Body Composition Clinical staff conducted group or individual video education with verbal and written material and guidebook.  Patient learns that body composition (ratio of muscle mass to fat mass) is a key component to assessing overall fitness, rather than body weight alone. Increased fat mass, especially visceral belly fat, can put us  at increased risk for metabolic syndrome, type 2 diabetes, heart disease, and even death. It is recommended to combine diet and exercise (cardiovascular and resistance training) to improve your body composition. Seek guidance from your physician and exercise physiologist before implementing an exercise routine.  Exercise Action Plan Clinical staff conducted group or individual video education with verbal and written material and guidebook.  Patient learns the recommended strategies to achieve and enjoy long-term exercise adherence, including variety, self-motivation, self-efficacy, and positive decision making. Benefits of exercise include fitness, good health,  weight management, more energy, better sleep, less stress, and overall well-being.  Medical   Heart Disease Risk Reduction Clinical staff conducted group or individual video education with verbal and written material and guidebook.  Patient learns our heart is our most vital organ as it circulates oxygen, nutrients, white blood cells, and hormones throughout the entire body, and carries waste away. Data supports a plant-based eating plan like the Pritikin Program for its effectiveness in slowing progression of and reversing heart disease. The video provides a number of recommendations to address heart disease.   Metabolic Syndrome and Belly Fat  Clinical staff conducted group or individual video education with verbal and written material and guidebook.  Patient learns what metabolic syndrome is, how it leads to heart disease, and how one can reverse it and keep it from coming back. You have metabolic syndrome if you have 3 of the following 5 criteria: abdominal obesity, high blood pressure, high triglycerides, low HDL cholesterol, and high blood sugar.  Hypertension and Heart Disease Clinical staff conducted group or individual video education with verbal and written material and guidebook.  Patient learns that high blood pressure, or hypertension, is very common in the [place] . Hypertension is largely due to excessive salt intake, but other important risk factors include being overweight, physical inactivity, drinking too much alcohol, smoking, and not eating enough potassium from fruits and vegetables. High blood pressure is a leading risk factor for heart attack, stroke, congestive heart failure, dementia, kidney failure, and premature death. Long-term effects of excessive salt intake include stiffening of the arteries and thickening of heart muscle and organ damage. Recommendations include ways to reduce hypertension and  the risk of heart disease.  Diseases of Our Time - Focusing on  Diabetes Clinical staff conducted group or individual video education with verbal and written material and guidebook.  Patient learns why the best way to stop diseases of our time is prevention, through food and other lifestyle changes. Medicine (such as prescription pills and surgeries) is often only a Band-Aid on the problem, not a long-term solution. Most common diseases of our time include obesity, type 2 diabetes, hypertension, heart disease, and cancer. The Pritikin Program is recommended and has been proven to help reduce, reverse, and/or prevent the damaging effects of metabolic syndrome.  Nutrition   Overview of the Pritikin Eating Plan  Clinical staff conducted group or individual video education with verbal and written material and guidebook.  Patient learns about the Pritikin Eating Plan for disease risk reduction. The Pritikin Eating Plan emphasizes a wide variety of unrefined, minimally-processed carbohydrates, like fruits, vegetables, whole grains, and legumes. Go, Caution, and Stop food choices are explained. Plant-based and lean animal proteins are emphasized. Rationale provided for low sodium intake for blood pressure control, low added sugars for blood sugar stabilization, and low added fats and oils for coronary artery disease risk reduction and weight management.  Calorie Density  Clinical staff conducted group or individual video education with verbal and written material and guidebook.  Patient learns about calorie density and how it impacts the Pritikin Eating Plan. Knowing the characteristics of the food you choose will help you decide whether those foods will lead to weight gain or weight loss, and whether you want to consume more or less of them. Weight loss is usually a side effect of the Pritikin Eating Plan because of its focus on low calorie-dense foods.  Label Reading  Clinical staff conducted group or individual video education with verbal and written material and  guidebook.  Patient learns about the Pritikin recommended label reading guidelines and corresponding recommendations regarding calorie density, added sugars, sodium content, and whole grains.  Dining Out - Part 1  Clinical staff conducted group or individual video education with verbal and written material and guidebook.  Patient learns that restaurant meals can be sabotaging because they can be so high in calories, fat, sodium, and/or sugar. Patient learns recommended strategies on how to positively address this and avoid unhealthy pitfalls.  Facts on Fats  Clinical staff conducted group or individual video education with verbal and written material and guidebook.  Patient learns that lifestyle modifications can be just as effective, if not more so, as many medications for lowering your risk of heart disease. A Pritikin lifestyle can help to reduce your risk of inflammation and atherosclerosis (cholesterol build-up, or plaque, in the artery walls). Lifestyle interventions such as dietary choices and physical activity address the cause of atherosclerosis. A review of the types of fats and their impact on blood cholesterol levels, along with dietary recommendations to reduce fat intake is also included.  Nutrition Action Plan  Clinical staff conducted group or individual video education with verbal and written material and guidebook.  Patient learns how to incorporate Pritikin recommendations into their lifestyle. Recommendations include planning and keeping personal health goals in mind as an important part of their success.  Healthy Mind-Set    Healthy Minds, Bodies, Hearts  Clinical staff conducted group or individual video education with verbal and written material and guidebook.  Patient learns how to identify when they are stressed. Video will discuss the impact of that stress, as well as the many  benefits of stress management. Patient will also be introduced to stress management techniques.  The way we think, act, and feel has an impact on our hearts.  How Our Thoughts Can Heal Our Hearts  Clinical staff conducted group or individual video education with verbal and written material and guidebook.  Patient learns that negative thoughts can cause depression and anxiety. This can result in negative lifestyle behavior and serious health problems. Cognitive behavioral therapy is an effective method to help control our thoughts in order to change and improve our emotional outlook.  Additional Videos:  Exercise    Improving Performance  Clinical staff conducted group or individual video education with verbal and written material and guidebook.  Patient learns to use a non-linear approach by alternating intensity levels and lengths of time spent exercising to help burn more calories and lose more body fat. Cardiovascular exercise helps improve heart health, metabolism, hormonal balance, blood sugar control, and recovery from fatigue. Resistance training improves strength, endurance, balance, coordination, reaction time, metabolism, and muscle mass. Flexibility exercise improves circulation, posture, and balance. Seek guidance from your physician and exercise physiologist before implementing an exercise routine and learn your capabilities and proper form for all exercise.  Introduction to Yoga  Clinical staff conducted group or individual video education with verbal and written material and guidebook.  Patient learns about yoga, a discipline of the coming together of mind, breath, and body. The benefits of yoga include improved flexibility, improved range of motion, better posture and core strength, increased lung function, weight loss, and positive self-image. Yoga's heart health benefits include lowered blood pressure, healthier heart rate, decreased cholesterol and triglyceride levels, improved immune function, and reduced stress. Seek guidance from your physician and exercise physiologist  before implementing an exercise routine and learn your capabilities and proper form for all exercise.  Medical   Aging: Enhancing Your Quality of Life  Clinical staff conducted group or individual video education with verbal and written material and guidebook.  Patient learns key strategies and recommendations to stay in good physical health and enhance quality of life, such as prevention strategies, having an advocate, securing a Health Care Proxy and Power of Attorney, and keeping a list of medications and system for tracking them. It also discusses how to avoid risk for bone loss.  Biology of Weight Control  Clinical staff conducted group or individual video education with verbal and written material and guidebook.  Patient learns that weight gain occurs because we consume more calories than we burn (eating more, moving less). Even if your body weight is normal, you may have higher ratios of fat compared to muscle mass. Too much body fat puts you at increased risk for cardiovascular disease, heart attack, stroke, type 2 diabetes, and obesity-related cancers. In addition to exercise, following the Pritikin Eating Plan can help reduce your risk.  Decoding Lab Results  Clinical staff conducted group or individual video education with verbal and written material and guidebook.  Patient learns that lab test reflects one measurement whose values change over time and are influenced by many factors, including medication, stress, sleep, exercise, food, hydration, pre-existing medical conditions, and more. It is recommended to use the knowledge from this video to become more involved with your lab results and evaluate your numbers to speak with your doctor.   Diseases of Our Time - Overview  Clinical staff conducted group or individual video education with verbal and written material and guidebook.  Patient learns that according to the CDC, 50% to  70% of chronic diseases (such as obesity, type 2 diabetes,  elevated lipids, hypertension, and heart disease) are avoidable through lifestyle improvements including healthier food choices, listening to satiety cues, and increased physical activity.  Sleep Disorders Clinical staff conducted group or individual video education with verbal and written material and guidebook.  Patient learns how good quality and duration of sleep are important to overall health and well-being. Patient also learns about sleep disorders and how they impact health along with recommendations to address them, including discussing with a physician.  Nutrition  Dining Out - Part 2 Clinical staff conducted group or individual video education with verbal and written material and guidebook.  Patient learns how to plan ahead and communicate in order to maximize their dining experience in a healthy and nutritious manner. Included are recommended food choices based on the type of restaurant the patient is visiting.   Fueling a Banker conducted group or individual video education with verbal and written material and guidebook.  There is a strong connection between our food choices and our health. Diseases like obesity and type 2 diabetes are very prevalent and are in large-part due to lifestyle choices. The Pritikin Eating Plan provides plenty of food and hunger-curbing satisfaction. It is easy to follow, affordable, and helps reduce health risks.  Menu Workshop  Clinical staff conducted group or individual video education with verbal and written material and guidebook.  Patient learns that restaurant meals can sabotage health goals because they are often packed with calories, fat, sodium, and sugar. Recommendations include strategies to plan ahead and to communicate with the manager, chef, or server to help order a healthier meal.  Planning Your Eating Strategy  Clinical staff conducted group or individual video education with verbal and written material and  guidebook.  Patient learns about the Pritikin Eating Plan and its benefit of reducing the risk of disease. The Pritikin Eating Plan does not focus on calories. Instead, it emphasizes high-quality, nutrient-rich foods. By knowing the characteristics of the foods, we choose, we can determine their calorie density and make informed decisions.  Targeting Your Nutrition Priorities  Clinical staff conducted group or individual video education with verbal and written material and guidebook.  Patient learns that lifestyle habits have a tremendous impact on disease risk and progression. This video provides eating and physical activity recommendations based on your personal health goals, such as reducing LDL cholesterol, losing weight, preventing or controlling type 2 diabetes, and reducing high blood pressure.  Vitamins and Minerals  Clinical staff conducted group or individual video education with verbal and written material and guidebook.  Patient learns different ways to obtain key vitamins and minerals, including through a recommended healthy diet. It is important to discuss all supplements you take with your doctor.   Healthy Mind-Set    Smoking Cessation  Clinical staff conducted group or individual video education with verbal and written material and guidebook.  Patient learns that cigarette smoking and tobacco addiction pose a serious health risk which affects millions of people. Stopping smoking will significantly reduce the risk of heart disease, lung disease, and many forms of cancer. Recommended strategies for quitting are covered, including working with your doctor to develop a successful plan.  Culinary   Becoming a Set designer conducted group or individual video education with verbal and written material and guidebook.  Patient learns that cooking at home can be healthy, cost-effective, quick, and puts them in control. Keys to cooking healthy recipes  will include looking  at your recipe, assessing your equipment needs, planning ahead, making it simple, choosing cost-effective seasonal ingredients, and limiting the use of added fats, salts, and sugars.  Cooking - Breakfast and Snacks  Clinical staff conducted group or individual video education with verbal and written material and guidebook.  Patient learns how important breakfast is to satiety and nutrition through the entire day. Recommendations include key foods to eat during breakfast to help stabilize blood sugar levels and to prevent overeating at meals later in the day. Planning ahead is also a key component.  Cooking - Educational psychologist conducted group or individual video education with verbal and written material and guidebook.  Patient learns eating strategies to improve overall health, including an approach to cook more at home. Recommendations include thinking of animal protein as a side on your plate rather than center stage and focusing instead on lower calorie dense options like vegetables, fruits, whole grains, and plant-based proteins, such as beans. Making sauces in large quantities to freeze for later and leaving the skin on your vegetables are also recommended to maximize your experience.  Cooking - Healthy Salads and Dressing Clinical staff conducted group or individual video education with verbal and written material and guidebook.  Patient learns that vegetables, fruits, whole grains, and legumes are the foundations of the Pritikin Eating Plan. Recommendations include how to incorporate each of these in flavorful and healthy salads, and how to create homemade salad dressings. Proper handling of ingredients is also covered. Cooking - Soups and [employer] - Soups and Desserts Clinical staff conducted group or individual video education with verbal and written material and guidebook.  Patient learns that Pritikin soups and desserts make for easy, nutritious, and delicious snacks  and meal components that are low in sodium, fat, sugar, and calorie density, while high in vitamins, minerals, and filling fiber. Recommendations include simple and healthy ideas for soups and desserts.   Overview     The Pritikin Solution Program Overview Clinical staff conducted group or individual video education with verbal and written material and guidebook.  Patient learns that the results of the Pritikin Program have been documented in more than 100 articles published in peer-reviewed journals, and the benefits include reducing risk factors for (and, in some cases, even reversing) high cholesterol, high blood pressure, type 2 diabetes, obesity, and more! An overview of the three key pillars of the Pritikin Program will be covered: eating well, doing regular exercise, and having a healthy mind-set.  WORKSHOPS  Exercise: Exercise Basics: Building Your Action Plan Clinical staff led group instruction and group discussion with PowerPoint presentation and patient guidebook. To enhance the learning environment the use of posters, models and videos may be added. At the conclusion of this workshop, patients will comprehend the difference between physical activity and exercise, as well as the benefits of incorporating both, into their routine. Patients will understand the FITT (Frequency, Intensity, Time, and Type) principle and how to use it to build an exercise action plan. In addition, safety concerns and other considerations for exercise and cardiac rehab will be addressed by the presenter. The purpose of this lesson is to promote a comprehensive and effective weekly exercise routine in order to improve patients' overall level of fitness.   Managing Heart Disease: Your Path to a Healthier Heart Clinical staff led group instruction and group discussion with PowerPoint presentation and patient guidebook. To enhance the learning environment the use of posters, models and videos  may be added.At the  conclusion of this workshop, patients will understand the anatomy and physiology of the heart. Additionally, they will understand how Pritikin's three pillars impact the risk factors, the progression, and the management of heart disease.  The purpose of this lesson is to provide a high-level overview of the heart, heart disease, and how the Pritikin lifestyle positively impacts risk factors.  Exercise Biomechanics Clinical staff led group instruction and group discussion with PowerPoint presentation and patient guidebook. To enhance the learning environment the use of posters, models and videos may be added. Patients will learn how the structural parts of their bodies function and how these functions impact their daily activities, movement, and exercise. Patients will learn how to promote a neutral spine, learn how to manage pain, and identify ways to improve their physical movement in order to promote healthy living. The purpose of this lesson is to expose patients to common physical limitations that impact physical activity. Participants will learn practical ways to adapt and manage aches and pains, and to minimize their effect on regular exercise. Patients will learn how to maintain good posture while sitting, walking, and lifting.  Balance Training and Fall Prevention  Clinical staff led group instruction and group discussion with PowerPoint presentation and patient guidebook. To enhance the learning environment the use of posters, models and videos may be added. At the conclusion of this workshop, patients will understand the importance of their sensorimotor skills (vision, proprioception, and the vestibular system) in maintaining their ability to balance as they age. Patients will apply a variety of balancing exercises that are appropriate for their current level of function. Patients will understand the common causes for poor balance, possible solutions to these problems, and ways to  modify their physical environment in order to minimize their fall risk. The purpose of this lesson is to teach patients about the importance of maintaining balance as they age and ways to minimize their risk of falling.  WORKSHOPS   Nutrition:  Fueling a Ship broker led group instruction and group discussion with PowerPoint presentation and patient guidebook. To enhance the learning environment the use of posters, models and videos may be added. Patients will review the foundational principles of the Pritikin Eating Plan and understand what constitutes a serving size in each of the food groups. Patients will also learn Pritikin-friendly foods that are better choices when away from home and review make-ahead meal and snack options. Calorie density will be reviewed and applied to three nutrition priorities: weight maintenance, weight loss, and weight gain. The purpose of this lesson is to reinforce (in a group setting) the key concepts around what patients are recommended to eat and how to apply these guidelines when away from home by planning and selecting Pritikin-friendly options. Patients will understand how calorie density may be adjusted for different weight management goals.  Mindful Eating  Clinical staff led group instruction and group discussion with PowerPoint presentation and patient guidebook. To enhance the learning environment the use of posters, models and videos may be added. Patients will briefly review the concepts of the Pritikin Eating Plan and the importance of low-calorie dense foods. The concept of mindful eating will be introduced as well as the importance of paying attention to internal hunger signals. Triggers for non-hunger eating and techniques for dealing with triggers will be explored. The purpose of this lesson is to provide patients with the opportunity to review the basic principles of the Pritikin Eating Plan, discuss the value of eating  mindfully and how to  measure internal cues of hunger and fullness using the Hunger Scale. Patients will also discuss reasons for non-hunger eating and learn strategies to use for controlling emotional eating.  Targeting Your Nutrition Priorities Clinical staff led group instruction and group discussion with PowerPoint presentation and patient guidebook. To enhance the learning environment the use of posters, models and videos may be added. Patients will learn how to determine their genetic susceptibility to disease by reviewing their family history. Patients will gain insight into the importance of diet as part of an overall healthy lifestyle in mitigating the impact of genetics and other environmental insults. The purpose of this lesson is to provide patients with the opportunity to assess their personal nutrition priorities by looking at their family history, their own health history and current risk factors. Patients will also be able to discuss ways of prioritizing and modifying the Pritikin Eating Plan for their highest risk areas  Menu  Clinical staff led group instruction and group discussion with PowerPoint presentation and patient guidebook. To enhance the learning environment the use of posters, models and videos may be added. Using menus brought in from [employer], or printed from [employer], patients will apply the Pritikin dining out guidelines that were presented in the [employer] video. Patients will also be able to practice these guidelines in a variety of provided scenarios. The purpose of this lesson is to provide patients with the opportunity to practice hands-on learning of the Pritikin Dining Out guidelines with actual menus and practice scenarios.  Label Reading Clinical staff led group instruction and group discussion with PowerPoint presentation and patient guidebook. To enhance the learning environment the use of posters, models and videos may be added. Patients will review  and discuss the Pritikin label reading guidelines presented in Pritikin's Label Reading Educational series video. Using fool labels brought in from local grocery stores and markets, patients will apply the label reading guidelines and determine if the packaged food meet the Pritikin guidelines. The purpose of this lesson is to provide patients with the opportunity to review, discuss, and practice hands-on learning of the Pritikin Label Reading guidelines with actual packaged food labels. Cooking School  Pritikin's [employer] are designed to teach patients ways to prepare quick, simple, and affordable recipes at home. The importance of nutrition's role in chronic disease risk reduction is reflected in its emphasis in the overall Pritikin program. By learning how to prepare essential core Pritikin Eating Plan recipes, patients will increase control over what they eat; be able to customize the flavor of foods without the use of added salt, sugar, or fat; and improve the quality of the food they consume. By learning a set of core recipes which are easily assembled, quickly prepared, and affordable, patients are more likely to prepare more healthy foods at home. These workshops focus on convenient breakfasts, simple entres, side dishes, and desserts which can be prepared with minimal effort and are consistent with nutrition recommendations for cardiovascular risk reduction. Cooking [employer] are taught by a Armed forces logistics/support/administrative officer (RD) who has been trained by the [employer]. The chef or RD has a clear understanding of the importance of minimizing - if not completely eliminating - added fat, sugar, and sodium in recipes. Throughout the series of Cooking School Workshop sessions, patients will learn about healthy ingredients and efficient methods of cooking to build confidence in their capability to prepare    [employer] weekly topics:  Adding Flavor- Sodium-Free  Fast  and Healthy Breakfasts  Powerhouse Plant-Based Proteins  Satisfying Salads and Dressings  Simple Sides and Sauces  International Cuisine-Spotlight on the Blue Zones  Delicious Desserts  Savory Soups  Efficiency Cooking - Meals in a Snap  Tasty Appetizers and Snacks  Comforting Weekend Breakfasts  One-Pot Wonders   Fast Evening Meals  Landscape architect Your Pritikin Plate  WORKSHOPS   Healthy Mindset (Psychosocial):  Focused Goals, Sustainable Changes Clinical staff led group instruction and group discussion with PowerPoint presentation and patient guidebook. To enhance the learning environment the use of posters, models and videos may be added. Patients will be able to apply effective goal setting strategies to establish at least one personal goal, and then take consistent, meaningful action toward that goal. They will learn to identify common barriers to achieving personal goals and develop strategies to overcome them. Patients will also gain an understanding of how our mind-set can impact our ability to achieve goals and the importance of cultivating a positive and growth-oriented mind-set. The purpose of this lesson is to provide patients with a deeper understanding of how to set and achieve personal goals, as well as the tools and strategies needed to overcome common obstacles which may arise along the way.  From Head to Heart: The Power of a Healthy Outlook  Clinical staff led group instruction and group discussion with PowerPoint presentation and patient guidebook. To enhance the learning environment the use of posters, models and videos may be added. Patients will be able to recognize and describe the impact of emotions and mood on physical health. They will discover the importance of self-care and explore self-care practices which may work for them. Patients will also learn how to utilize the 4 C's to cultivate a healthier outlook and better manage stress and  challenges. The purpose of this lesson is to demonstrate to patients how a healthy outlook is an essential part of maintaining good health, especially as they continue their cardiac rehab journey.  Healthy Sleep for a Healthy Heart Clinical staff led group instruction and group discussion with PowerPoint presentation and patient guidebook. To enhance the learning environment the use of posters, models and videos may be added. At the conclusion of this workshop, patients will be able to demonstrate knowledge of the importance of sleep to overall health, well-being, and quality of life. They will understand the symptoms of, and treatments for, common sleep disorders. Patients will also be able to identify daytime and nighttime behaviors which impact sleep, and they will be able to apply these tools to help manage sleep-related challenges. The purpose of this lesson is to provide patients with a general overview of sleep and outline the importance of quality sleep. Patients will learn about a few of the most common sleep disorders. Patients will also be introduced to the concept of "sleep hygiene," and discover ways to self-manage certain sleeping problems through simple daily behavior changes. Finally, the workshop will motivate patients by clarifying the links between quality sleep and their goals of heart-healthy living.   Recognizing and Reducing Stress Clinical staff led group instruction and group discussion with PowerPoint presentation and patient guidebook. To enhance the learning environment the use of posters, models and videos may be added. At the conclusion of this workshop, patients will be able to understand the types of stress reactions, differentiate between acute and chronic stress, and recognize the impact that chronic stress has on their health. They will also be able to apply  different coping mechanisms, such as reframing negative self-talk. Patients will have the opportunity to practice a  variety of stress management techniques, such as deep abdominal breathing, progressive muscle relaxation, and/or guided imagery.  The purpose of this lesson is to educate patients on the role of stress in their lives and to provide healthy techniques for coping with it.  Learning Barriers/Preferences:  Learning Barriers/Preferences - 10/06/23 1417       Learning Barriers/Preferences   Learning Barriers None    Learning Preferences Skilled Demonstration;Video          Education Topics:  Knowledge Questionnaire Score:  Knowledge Questionnaire Score - 10/06/23 1414       Knowledge Questionnaire Score   Pre Score 24/24          Core Components/Risk Factors/Patient Goals at Admission:  Personal Goals and Risk Factors at Admission - 10/06/23 1207       Core Components/Risk Factors/Patient Goals on Admission    Weight Management Yes;Obesity;Weight Loss    Intervention Obesity: Provide education and appropriate resources to help participant work on and attain dietary goals.;Weight Management/Obesity: Establish reasonable short term and long term weight goals.    Admit Weight 205 lb 4 oz (93.1 kg)    Expected Outcomes Short Term: Continue to assess and modify interventions until short term weight is achieved;Long Term: Adherence to nutrition and physical activity/exercise program aimed toward attainment of established weight goal;Weight Loss: Understanding of general recommendations for a balanced deficit meal plan, which promotes 1-2 lb weight loss per week and includes a negative energy balance of [phone] kcal/d    Diabetes Yes    Intervention Provide education about signs/symptoms and action to take for hypo/hyperglycemia.;Provide education about proper nutrition, including hydration, and aerobic/resistive exercise prescription along with prescribed medications to achieve blood glucose in normal ranges: Fasting glucose 65-99 mg/dL    Expected Outcomes Short Term: Participant  verbalizes understanding of the signs/symptoms and immediate care of hyper/hypoglycemia, proper foot care and importance of medication, aerobic/resistive exercise and nutrition plan for blood glucose control.;Long Term: Attainment of HbA1C < 7%.    Hypertension Yes    Intervention Provide education on lifestyle modifcations including regular physical activity/exercise, weight management, moderate sodium restriction and increased consumption of fresh fruit, vegetables, and low fat dairy, alcohol moderation, and smoking cessation.;Monitor prescription use compliance.    Expected Outcomes Short Term: Continued assessment and intervention until BP is < 140/2mm HG in hypertensive participants. < 130/84mm HG in hypertensive participants with diabetes, heart failure or chronic kidney disease.;Long Term: Maintenance of blood pressure at goal levels.    Lipids Yes    Intervention Provide education and support for participant on nutrition & aerobic/resistive exercise along with prescribed medications to achieve LDL 70mg , HDL >40mg .    Expected Outcomes Short Term: Participant states understanding of desired cholesterol values and is compliant with medications prescribed. Participant is following exercise prescription and nutrition guidelines.;Long Term: Cholesterol controlled with medications as prescribed, with individualized exercise RX and with personalized nutrition plan. Value goals: LDL < 70mg , HDL > 40 mg.    Stress Yes    Intervention Offer individual and/or small group education and counseling on adjustment to heart disease, stress management and health-related lifestyle change. Teach and support self-help strategies.;Refer participants experiencing significant psychosocial distress to appropriate mental health specialists for further evaluation and treatment. When possible, include family members and significant others in education/counseling sessions.    Expected Outcomes Short Term: Participant  demonstrates changes in health-related behavior, relaxation and other  stress management skills, ability to obtain effective social support, and compliance with psychotropic medications if prescribed.;Long Term: Emotional wellbeing is indicated by absence of clinically significant psychosocial distress or social isolation.          Core Components/Risk Factors/Patient Goals Review:   Goals and Risk Factor Review     Row Name 10/15/23 1510             Core Components/Risk Factors/Patient Goals Review   Personal Goals Review Weight Management/Obesity;Stress;Lipids;Hypertension       Review Jaslin started cardiac rehab on 10/14/23. Malene did well with exercise. Vital signs and CBG's were stable.       Expected Outcomes Wesleigh will continue to participate in cardiac rehab for exercise, nutrition and lifestyle modifications          Core Components/Risk Factors/Patient Goals at Discharge (Final Review):   Goals and Risk Factor Review - 10/15/23 1510       Core Components/Risk Factors/Patient Goals Review   Personal Goals Review Weight Management/Obesity;Stress;Lipids;Hypertension    Review Fynlee started cardiac rehab on 10/14/23. Shanara did well with exercise. Vital signs and CBG's were stable.    Expected Outcomes Ettie will continue to participate in cardiac rehab for exercise, nutrition and lifestyle modifications          ITP Comments:  ITP Comments     Row Name 10/06/23 1038 10/14/23 1135         ITP Comments Wilbert Bihari, MD: Medical director. Intorduction to the [facility] Initial orientation packet reviewed with the patient. 30-day ITP review. Brooklin started the cardiac rehab program and is off to a great start. She has many goals that she hopes to achieve through participation in the program.         Comments: See ITP Comments

## 2023-10-21 ENCOUNTER — Encounter (HOSPITAL_COMMUNITY)
Admission: RE | Admit: 2023-10-21 | Discharge: 2023-10-21 | Disposition: A | Discharge status: Home / Self Care | Source: Ambulatory Visit | Attending: Cardiology | Admitting: Cardiology

## 2023-10-21 DIAGNOSIS — Z952 Presence of prosthetic heart valve: Secondary | ICD-10-CM

## 2023-10-21 DIAGNOSIS — Z48812 Encounter for surgical aftercare following surgery on the circulatory system: Secondary | ICD-10-CM | POA: Diagnosis not present

## 2023-10-21 NOTE — Progress Notes (Signed)
 QUALITY OF LIFE SCORE REVIEW  Pt completed Quality of Life survey as a participant in Cardiac Rehab. Reviewed PHQ9  Scores 19.0 or below are considered low.  Pt score very low in several areas Overall 22.45, Health and Function 19.71 , socioeconomic 30, physiological and spiritual 21.43, family 20.00. Patient quality of life slightly altered by physical constraints which limits ability to perform as prior to recent cardiac illness. Tracy Kennedy is dissatisfied with her health due to being over weight. Tracy Kennedy is very motivated and excited about participating in cardiac rehab. Tracy Kennedy says she is still dealing with the loss of her spouse 2 years ago.Tracy Kennedy denies being depressed. Tracy Kennedy says that her sleep and appetite have been improving.   Offered emotional support and reassurance.  Will continue to monitor and intervene as necessary.  SABRAmw

## 2023-10-23 ENCOUNTER — Encounter (HOSPITAL_COMMUNITY)
Admission: RE | Admit: 2023-10-23 | Discharge: 2023-10-23 | Disposition: A | Discharge status: Home / Self Care | Source: Ambulatory Visit | Attending: Cardiology | Admitting: Cardiology

## 2023-10-23 DIAGNOSIS — Z952 Presence of prosthetic heart valve: Secondary | ICD-10-CM

## 2023-10-23 DIAGNOSIS — Z48812 Encounter for surgical aftercare following surgery on the circulatory system: Secondary | ICD-10-CM | POA: Diagnosis not present

## 2023-10-26 ENCOUNTER — Encounter (HOSPITAL_COMMUNITY)
Admission: RE | Admit: 2023-10-26 | Discharge: 2023-10-26 | Disposition: A | Discharge status: Home / Self Care | Source: Ambulatory Visit | Attending: Cardiology

## 2023-10-26 DIAGNOSIS — Z952 Presence of prosthetic heart valve: Secondary | ICD-10-CM

## 2023-10-26 DIAGNOSIS — Z48812 Encounter for surgical aftercare following surgery on the circulatory system: Secondary | ICD-10-CM | POA: Diagnosis not present

## 2023-10-28 ENCOUNTER — Encounter (HOSPITAL_COMMUNITY)
Admission: RE | Admit: 2023-10-28 | Discharge: 2023-10-28 | Disposition: A | Discharge status: Home / Self Care | Source: Ambulatory Visit | Attending: Cardiology

## 2023-10-28 DIAGNOSIS — Z48812 Encounter for surgical aftercare following surgery on the circulatory system: Secondary | ICD-10-CM | POA: Diagnosis not present

## 2023-10-28 DIAGNOSIS — Z952 Presence of prosthetic heart valve: Secondary | ICD-10-CM

## 2023-10-30 ENCOUNTER — Encounter (HOSPITAL_COMMUNITY)
Admission: RE | Admit: 2023-10-30 | Discharge: 2023-10-30 | Disposition: A | Discharge status: Home / Self Care | Source: Ambulatory Visit | Attending: Cardiology | Admitting: Cardiology

## 2023-10-30 DIAGNOSIS — Z952 Presence of prosthetic heart valve: Secondary | ICD-10-CM

## 2023-10-30 DIAGNOSIS — Z48812 Encounter for surgical aftercare following surgery on the circulatory system: Secondary | ICD-10-CM | POA: Diagnosis not present

## 2023-11-02 ENCOUNTER — Encounter (HOSPITAL_COMMUNITY)
Admission: RE | Admit: 2023-11-02 | Discharge: 2023-11-02 | Disposition: A | Discharge status: Home / Self Care | Source: Ambulatory Visit | Attending: Cardiology

## 2023-11-02 DIAGNOSIS — Z48812 Encounter for surgical aftercare following surgery on the circulatory system: Secondary | ICD-10-CM | POA: Diagnosis not present

## 2023-11-02 DIAGNOSIS — Z952 Presence of prosthetic heart valve: Secondary | ICD-10-CM

## 2023-11-04 ENCOUNTER — Encounter (HOSPITAL_COMMUNITY)
Admission: RE | Admit: 2023-11-04 | Discharge: 2023-11-04 | Disposition: A | Discharge status: Home / Self Care | Source: Ambulatory Visit | Attending: Cardiology

## 2023-11-04 DIAGNOSIS — Z952 Presence of prosthetic heart valve: Secondary | ICD-10-CM

## 2023-11-04 DIAGNOSIS — Z48812 Encounter for surgical aftercare following surgery on the circulatory system: Secondary | ICD-10-CM | POA: Diagnosis not present

## 2023-11-06 ENCOUNTER — Encounter (HOSPITAL_COMMUNITY)
Admission: RE | Admit: 2023-11-06 | Discharge: 2023-11-06 | Disposition: A | Discharge status: Home / Self Care | Source: Ambulatory Visit | Attending: Cardiology | Admitting: Cardiology

## 2023-11-06 DIAGNOSIS — Z952 Presence of prosthetic heart valve: Secondary | ICD-10-CM

## 2023-11-06 DIAGNOSIS — Z48812 Encounter for surgical aftercare following surgery on the circulatory system: Secondary | ICD-10-CM | POA: Diagnosis not present

## 2023-11-06 NOTE — Progress Notes (Signed)
 Reviewed home exercise guidelines with Tracy Kennedy including endpoints, temperature precautions, target heart rate and rate of perceived exertion. She is currently walking 15 minutes 2 days/week as her mode of home exercise. She plans to start an online Tai Chi class this weekend. Tracy Kennedy voices understanding of instructions given.  Arnoldo CHRISTELLA Gal, MS, ACSM CEP

## 2023-11-09 ENCOUNTER — Encounter (HOSPITAL_COMMUNITY)
Admission: RE | Admit: 2023-11-09 | Discharge: 2023-11-09 | Disposition: A | Discharge status: Home / Self Care | Source: Ambulatory Visit | Attending: Cardiology | Admitting: Cardiology

## 2023-11-09 DIAGNOSIS — Z952 Presence of prosthetic heart valve: Secondary | ICD-10-CM

## 2023-11-09 DIAGNOSIS — Z48812 Encounter for surgical aftercare following surgery on the circulatory system: Secondary | ICD-10-CM | POA: Diagnosis not present

## 2023-11-11 ENCOUNTER — Encounter (HOSPITAL_COMMUNITY)
Admission: RE | Admit: 2023-11-11 | Discharge: 2023-11-11 | Disposition: A | Discharge status: Home / Self Care | Source: Ambulatory Visit | Attending: Cardiology | Admitting: Cardiology

## 2023-11-11 DIAGNOSIS — Z952 Presence of prosthetic heart valve: Secondary | ICD-10-CM | POA: Insufficient documentation

## 2023-11-13 ENCOUNTER — Encounter (HOSPITAL_COMMUNITY)
Admission: RE | Admit: 2023-11-13 | Discharge: 2023-11-13 | Disposition: A | Discharge status: Home / Self Care | Source: Ambulatory Visit | Attending: Cardiology | Admitting: Cardiology

## 2023-11-13 DIAGNOSIS — Z952 Presence of prosthetic heart valve: Secondary | ICD-10-CM

## 2023-11-16 ENCOUNTER — Encounter (HOSPITAL_COMMUNITY)
Admission: RE | Admit: 2023-11-16 | Discharge: 2023-11-16 | Disposition: A | Discharge status: Home / Self Care | Source: Ambulatory Visit | Attending: Cardiology | Admitting: Cardiology

## 2023-11-16 DIAGNOSIS — Z952 Presence of prosthetic heart valve: Secondary | ICD-10-CM

## 2023-11-17 NOTE — Progress Notes (Signed)
 Cardiac Individual Treatment Plan  Patient Details  Name: Tracy Kennedy MRN: [id number] Date of Birth: [birth year] Referring Provider:   Flowsheet Row INTENSIVE CARDIAC REHAB ORIENT from 10/06/2023 in [facility]  Referring Provider Uvaldo Fusi, MD  Tracy Tracy SAUNDERS, MD]    Initial Encounter Date:  Flowsheet Row INTENSIVE CARDIAC REHAB ORIENT from 10/06/2023 in [facility]  Date 10/06/23    Visit Diagnosis: 08/11/23 S/P TAVR (transcatheter aortic valve replacement)  Patient's Home Medications on Admission:  Current Outpatient Medications:    ACETAMINOPHEN PO, Take by mouth 2 (two) times daily., Disp: , Rfl:    albuterol  (PROVENTIL  HFA;VENTOLIN  HFA) 108 (90 Base) MCG/ACT inhaler, Inhale 2 puffs into the lungs every 6 (six) hours as needed., Disp: 1 Inhaler, Rfl: 1   Ascorbic Acid (VITAMIN C) 1000 MG tablet, Take 1,000 mg by mouth daily., Disp: , Rfl:    aspirin 81 MG tablet, Take 81 mg by mouth daily., Disp: , Rfl:    BIOTIN PO, Take by mouth daily., Disp: , Rfl:    Blood Glucose Monitoring Suppl (ONETOUCH VERIO) W/DEVICE KIT, 1 Device by Does not apply route AC breakfast., Disp: 1 kit, Rfl: 12   CALCIUM PO, Take by mouth., Disp: , Rfl:    cetirizine  (ZYRTEC ) 10 MG tablet, TAKE 1 TABLET(10 MG) BY MOUTH DAILY (Patient not taking: Reported on 10/06/2023), Disp: 90 tablet, Rfl: 0   chlorthalidone  (HYGROTON ) 25 MG tablet, TAKE 1 TABLET(25 MG) BY MOUTH DAILY (Patient taking differently: Take 12.5 mg by mouth daily. TAKE 1 TABLET(12.5 MG) BY MOUTH DAILY), Disp: 90 tablet, Rfl: 3   Cholecalciferol (VITAMIN D3) 50 MCG (2000 UT) capsule, Take 2,000 Units by mouth daily., Disp: , Rfl:    clobetasol (TEMOVATE) 0.05 % external solution, , Disp: , Rfl:    clotrimazole -betamethasone  (LOTRISONE ) cream, Apply 1 application topically 2 (two) times daily., Disp: 30 g, Rfl: 0   colestipol  (COLESTID) 1 g tablet, , Disp: , Rfl:    Cyanocobalamin (VITAMIN B-12 PO), Take by mouth. (Patient not taking: Reported on 10/06/2023), Disp: , Rfl:    ezetimibe (ZETIA) 10 MG tablet, Take 10 mg by mouth daily., Disp: , Rfl:    famotidine (PEPCID) 20 MG tablet, Take by mouth., Disp: , Rfl:    gabapentin (NEURONTIN) 300 MG capsule, Take 300 mg by mouth 2 (two) times daily., Disp: , Rfl:    GLUCOSAMINE PO, Take by mouth., Disp: , Rfl:    ipratropium (ATROVENT ) 0.03 % nasal spray, Place 2 sprays into both nostrils 2 (two) times daily. (Patient taking differently: Place 2 sprays into both nostrils as needed for rhinitis.), Disp: 30 mL, Rfl: 12   Lancet Devices (LANCING DEVICE) MISC, Use to test blood sugar daily. One box, Disp: 1 each, Rfl: 2   Lancets MISC, Use to test blood sugar daily, Disp: 100 each, Rfl: 2   loratadine (CLARITIN) 10 MG tablet, Take by mouth., Disp: , Rfl:    losartan (COZAAR) 100 MG tablet, , Disp: , Rfl:    magnesium oxide (MAG-OX) 400 (240 Mg) MG tablet, Take 400 mg by mouth daily., Disp: , Rfl:    montelukast (SINGULAIR) 10 MG tablet, Take 10 mg by mouth at bedtime., Disp: , Rfl:    Multiple Vitamin (MULTIVITAMIN) tablet, Take 1 tablet by mouth daily., Disp: , Rfl:    niacin (NIASPAN) 750 MG CR tablet, Take 750 mg by mouth  at bedtime., Disp: , Rfl:    omeprazole  (PRILOSEC) 20 MG capsule, TAKE 1 CAPSULE(20 MG) BY MOUTH DAILY, Disp: 90 capsule, Rfl: 3   ONETOUCH VERIO test strip, TEST AS DIRECTED, Disp: 100 each, Rfl: 1   scopolamine  (TRANSDERM-SCOP) 1 MG/3DAYS, Place 1 patch (1.5 mg total) onto the skin every 3 (three) days. (Patient not taking: Reported on 10/06/2023), Disp: 10 patch, Rfl: 0   spironolactone (ALDACTONE) 25 MG tablet, Take 0.5 tablets (12.5 mg total) by mouth daily., Disp: , Rfl:    triamcinolone  (KENALOG ) 0.025 % ointment, Apply 1 application topically 2 (two) times daily., Disp: 30 g, Rfl: 0   Turmeric (QC TUMERIC COMPLEX PO), Take 1,000 mg by mouth daily.,  Disp: , Rfl:    valsartan  (DIOVAN ) 320 MG tablet, Take 1 tablet (320 mg total) by mouth daily., Disp: 90 tablet, Rfl: 3   verapamil  (VERELAN  PM) 180 MG 24 hr capsule, Take 180 mg by mouth daily., Disp: , Rfl: 4   vitamin E 1000 UNIT capsule, Take 1,000 Units by mouth daily., Disp: , Rfl:    WELCHOL  625 MG tablet, TAKE 3 TABLETS BY MOUTH TWICE DAILY WITH A MEAL (Patient not taking: Reported on 10/06/2023), Disp: 540 tablet, Rfl: 3  Current Facility-Administered Medications:    betamethasone  acetate-betamethasone  sodium phosphate (CELESTONE ) injection 3 mg, 3 mg, Intramuscular, Once, Tracy Kennedy, DPM  Past Medical History: Past Medical History:  Diagnosis Date   Asthma    Asthma    childhood   Cardiac murmur    Diabetes mellitus    HTN (hypertension)    Microalbuminuria    Obesity     Tobacco Use: Social History   Tobacco Use  Smoking Status Never  Smokeless Tobacco Never    Labs: Review Flowsheet  More data exists      Latest Ref Rng & Units 01/15/2016 04/29/2016 12/02/2016 03/10/2017 06/09/2017  Labs for ITP Cardiac and Pulmonary Rehab  Cholestrol 100 - 199 mg/dL 842  744  799  773  809   LDL (calc) 0 - 99 mg/dL 80  821  879  860  883   HDL-C >39 mg/dL 53  53  56  53  48   Trlycerides 0 - 149 mg/dL 881  879  879  831  871   Hemoglobin A1c 4.8 - 5.6 % 5.8  5.7  6.1  6.0  6.0      Exercise Target Goals: Exercise Program Goal: Individual exercise prescription set using results from initial 6 min walk test and THRR while considering  patient's activity barriers and safety.   Exercise Prescription Goal: Initial exercise prescription builds to 30-45 minutes a day of aerobic activity, 2-3 days per week.  Home exercise guidelines will be given to patient during program as part of exercise prescription that the participant will acknowledge.   Education: Aerobic Exercise: - Group verbal and visual presentation on the components of exercise prescription. Introduces F.I.T.T  principle from ACSM for exercise prescriptions.  Reviews F.I.T.T. principles of aerobic exercise including progression. Written material provided at class time.   Education: Resistance Exercise: - Group verbal and visual presentation on the components of exercise prescription. Introduces F.I.T.T principle from ACSM for exercise prescriptions  Reviews F.I.T.T. principles of resistance exercise including progression. Written material provided at class time.    Education: Exercise & Equipment Safety: - Individual verbal instruction and demonstration of equipment use and safety with use of the equipment.   Education: Exercise Physiology & General Exercise Guidelines: -  Group verbal and written instruction with models to review the exercise physiology of the cardiovascular system and associated critical values. Provides general exercise guidelines with specific guidelines to those with heart or lung disease. Written material provided at class time.   Education: Flexibility, Balance, Mind/Body Relaxation: - Group verbal and visual presentation with interactive activity on the components of exercise prescription. Introduces F.I.T.T principle from ACSM for exercise prescriptions. Reviews F.I.T.T. principles of flexibility and balance exercise training including progression. Also discusses the mind body connection.  Reviews various relaxation techniques to help reduce and manage stress (i.e. Deep breathing, progressive muscle relaxation, and visualization). Balance handout provided to take home. Written material provided at class time.   Activity Barriers & Risk Stratification:  Activity Barriers & Cardiac Risk Stratification - 10/06/23 1207       Activity Barriers & Cardiac Risk Stratification   Activity Barriers Back Problems;History of Falls;Balance Concerns;Other (comment)    Comments Pinched nerve- right leg. Pain in left hip down leg. Chronic low back pain.    Cardiac Risk Stratification High           6 Minute Walk:  6 Minute Walk     Row Name 10/06/23 1225         6 Minute Walk   Phase Initial     Distance 1168 feet     Walk Time 6 minutes     # of Rest Breaks 0     MPH 2.21     METS 2.21     RPE 11     Perceived Dyspnea  1     VO2 Peak 7.73     Symptoms Yes (comment)     Comments Mild shortness of breath.     Resting HR 67 bpm     Resting BP 146/84     Resting Oxygen Saturation  95 %     Exercise Oxygen Saturation  during 6 min walk 97 %     Max Ex. HR 93 bpm     Max Ex. BP 168/68     2 Minute Post BP 134/70        Oxygen Initial Assessment:   Oxygen Re-Evaluation:   Oxygen Discharge (Final Oxygen Re-Evaluation):   Initial Exercise Prescription:  Initial Exercise Prescription - 10/06/23 1400       Date of Initial Exercise RX and Referring Provider   Date 10/06/23    Referring Provider Uvaldo Fusi, MD   Shlomo Tracy SAUNDERS, MD   Expected Discharge Date 01/01/24      Recumbant Bike   Level 1    Watts 10    Minutes 15    METs 2.2      NuStep   Level 1    SPM 85    Minutes 15    METs 2.2      Prescription Details   Frequency (times per week) 3    Duration Progress to 30 minutes of continuous aerobic without signs/symptoms of physical distress      Intensity   THRR 40-80% of Max Heartrate 60-119    Ratings of Perceived Exertion 11-13    Perceived Dyspnea 0-4      Progression   Progression Continue to progress workloads to maintain intensity without signs/symptoms of physical distress.      Resistance Training   Training Prescription Yes    Weight 2 lbs    Reps 10-15          Perform Capillary Blood Glucose checks as needed.  Exercise Prescription Changes:   Exercise Prescription Changes     Row Name 10/14/23 1034 11/04/23 1027 11/06/23 1027 11/16/23 1006       Response to Exercise   Blood Pressure (Admit) 140/72 122/72 142/80 150/68    Blood Pressure (Exercise) 146/60 -- -- --    Blood Pressure (Exit) 126/72 128/70  136/70 120/60    Heart Rate (Admit) 63 bpm 72 bpm 73 bpm 60 bpm    Heart Rate (Exercise) 85 bpm 92 bpm 90 bpm 87 bpm    Heart Rate (Exit) 66 bpm 79 bpm 82 bpm 58 bpm    Rating of Perceived Exertion (Exercise) 12 12 12.5 13    Symptoms None None None None    Comments Off to a good start with exercise. -- Reviewed home exercise guidelines with Orie. Reviewed METs with Orie.    Duration Continue with 30 min of aerobic exercise without signs/symptoms of physical distress. Continue with 30 min of aerobic exercise without signs/symptoms of physical distress. Continue with 30 min of aerobic exercise without signs/symptoms of physical distress. Continue with 30 min of aerobic exercise without signs/symptoms of physical distress.    Intensity THRR unchanged THRR unchanged THRR unchanged THRR unchanged      Progression   Progression Continue to progress workloads to maintain intensity without signs/symptoms of physical distress. Continue to progress workloads to maintain intensity without signs/symptoms of physical distress. Continue to progress workloads to maintain intensity without signs/symptoms of physical distress. Continue to progress workloads to maintain intensity without signs/symptoms of physical distress.    Average METs 1.9 2.4 2.5 2.5      Resistance Training   Training Prescription No No Yes Yes    Weight Relaxation day, no weights. Relaxation day, no weights. 2 lbs 2 lbs    Reps -- -- 10-15 10-15    Time -- -- 5 Minutes 5 Minutes      Interval Training   Interval Training No No No No      Recumbant Bike   Level 1 2 2 2     RPM 43 70 76 68    Watts 11 20 27 17     Minutes 15 15 15 15     METs 1.8 2.2 2.4 2.2      NuStep   Level 1 2 2 2     SPM 75 101 101 100    Minutes 15 15 15 15     METs 2 2.6 2.6 2.8      Home Exercise Plan   Plans to continue exercise at -- -- Home (comment)  Walking Home (comment)  Walking    Frequency -- -- Add 2 additional days to program exercise  sessions. Add 2 additional days to program exercise sessions.    Initial Home Exercises Provided -- -- 11/06/23 11/06/23       Exercise Comments:   Exercise Comments     Row Name 10/14/23 1135 10/21/23 1134 11/06/23 1050 11/16/23 1102     Exercise Comments Janesha tolerated low intensity exercise well without symptoms. Oriented her to the exercise equipment and stretching routine. Reviewed METs activity chart. Stretching handout given. Reviewed goals with Orie. Reviewed home exercise guidelines with Orie. Reviewed METs with Orie.       Exercise Goals and Review:   Exercise Goals     Row Name 10/06/23 1207             Exercise Goals   Increase Physical Activity Yes       Intervention  Provide advice, education, support and counseling about physical activity/exercise needs.;Develop an individualized exercise prescription for aerobic and resistive training based on initial evaluation findings, risk stratification, comorbidities and participant's personal goals.       Expected Outcomes Short Term: Attend rehab on a regular basis to increase amount of physical activity.;Long Term: Exercising regularly at least 3-5 days a week.;Long Term: Add in home exercise to make exercise part of routine and to increase amount of physical activity.       Increase Strength and Stamina Yes       Intervention Provide advice, education, support and counseling about physical activity/exercise needs.;Develop an individualized exercise prescription for aerobic and resistive training based on initial evaluation findings, risk stratification, comorbidities and participant's personal goals.       Expected Outcomes Short Term: Increase workloads from initial exercise prescription for resistance, speed, and METs.;Short Term: Perform resistance training exercises routinely during rehab and add in resistance training at home;Long Term: Improve cardiorespiratory fitness, muscular endurance and strength as measured by  increased METs and functional capacity ( )       Able to understand and use rate of perceived exertion (RPE) scale Yes       Intervention Provide education and explanation on how to use RPE scale       Expected Outcomes Short Term: Able to use RPE daily in rehab to express subjective intensity level;Long Term:  Able to use RPE to guide intensity level when exercising independently       Knowledge and understanding of Target Heart Rate Range (THRR) Yes       Intervention Provide education and explanation of THRR including how the numbers were predicted and where they are located for reference       Expected Outcomes Short Term: Able to state/look up THRR;Short Term: Able to use daily as guideline for intensity in rehab;Long Term: Able to use THRR to govern intensity when exercising independently       Able to check pulse independently Yes       Intervention Provide education and demonstration on how to check pulse in carotid and radial arteries.;Review the importance of being able to check your own pulse for safety during independent exercise       Expected Outcomes Short Term: Able to explain why pulse checking is important during independent exercise;Long Term: Able to check pulse independently and accurately       Understanding of Exercise Prescription Yes       Intervention Provide education, explanation, and written materials on patient's individual exercise prescription       Expected Outcomes Short Term: Able to explain program exercise prescription;Long Term: Able to explain home exercise prescription to exercise independently          Exercise Goals Re-Evaluation :  Exercise Goals Re-Evaluation     Row Name 10/14/23 1135 10/21/23 1134 11/06/23 1050         Exercise Goal Re-Evaluation   Exercise Goals Review Increase Physical Activity;Increase Strength and Stamina;Able to understand and use rate of perceived exertion (RPE) scale Increase Physical Activity;Increase Strength and  Stamina;Able to understand and use rate of perceived exertion (RPE) scale Increase Physical Activity;Increase Strength and Stamina;Able to understand and use rate of perceived exertion (RPE) scale;Understanding of Exercise Prescription;Knowledge and understanding of Target Heart Rate Range (THRR);Able to check pulse independently     Comments Lavayah was able to understand and use RPE scale appropriately. Lakiah already feels a positive improvement since starting the cardiac rehab program.  She's walking in the grocery store and using the cart for stability. She's looking into starting a walking program in her neighborhood. Reviewed exercise prescription with Orie. She is walking 15 minutes on Tuesdays and Thursdays. She plans to start Tai Chi online this weekend. She has a smart watch to monitor her pulse.     Expected Outcomes Progress workloads as tolerated to help increase cardiorespiratory fitness. Supriya will continue walking in addition to exercise at cardiac rehab. Anginette will continue walking and add Tai Chi to her home exercise routine.        Discharge Exercise Prescription (Final Exercise Prescription Changes):  Exercise Prescription Changes - 11/16/23 1006       Response to Exercise   Blood Pressure (Admit) 150/68    Blood Pressure (Exit) 120/60    Heart Rate (Admit) 60 bpm    Heart Rate (Exercise) 87 bpm    Heart Rate (Exit) 58 bpm    Rating of Perceived Exertion (Exercise) 13    Symptoms None    Comments Reviewed METs with Orie.    Duration Continue with 30 min of aerobic exercise without signs/symptoms of physical distress.    Intensity THRR unchanged      Progression   Progression Continue to progress workloads to maintain intensity without signs/symptoms of physical distress.    Average METs 2.5      Resistance Training   Training Prescription Yes    Weight 2 lbs    Reps 10-15    Time 5 Minutes      Interval Training   Interval Training No      Recumbant Bike   Level  2    RPM 68    Watts 17    Minutes 15    METs 2.2      NuStep   Level 2    SPM 100    Minutes 15    METs 2.8      Home Exercise Plan   Plans to continue exercise at Home (comment)   Walking   Frequency Add 2 additional days to program exercise sessions.    Initial Home Exercises Provided 11/06/23          Nutrition:  Target Goals: Understanding of nutrition guidelines, daily intake of sodium 1500mg , cholesterol 200mg , calories 30% from fat and 7% or less from saturated fats, daily to have 5 or more servings of fruits and vegetables.  Education: Nutrition 1 -Group instruction provided by verbal, written material, interactive activities, discussions, models, and posters to present general guidelines for heart healthy nutrition including macronutrients, label reading, and promoting whole foods over processed counterparts. Education serves as Pensions consultant of discussion of heart healthy eating for all. Written material provided at class time.    Education: Nutrition 2 -Group instruction provided by verbal, written material, interactive activities, discussions, models, and posters to present general guidelines for heart healthy nutrition including sodium, cholesterol, and saturated fat. Providing guidance of habit forming to improve blood pressure, cholesterol, and body weight. Written material provided at class time.     Biometrics:  Pre Biometrics - 10/06/23 1037       Pre Biometrics   Waist Circumference 45.75 inches    Hip Circumference 49 inches    Waist to Hip Ratio 0.93 %    Triceps Skinfold 43 mm    % Body Fat 51.2 %    Grip Strength 14 kg    Flexibility --   Not performed. Chronic low back pain.   Single Leg  Stand 7.75 seconds           Nutrition Therapy Plan and Nutrition Goals:   Nutrition Assessments:  MEDIFICTS Score Key: >=70 Need to make dietary changes  40-70 Heart Healthy Diet <= 40 Therapeutic Level Cholesterol Diet  Flowsheet Row INTENSIVE  CARDIAC REHAB from 10/14/2023 in [facility]  Picture Your Plate Total Score on Admission 52   Picture Your Plate Scores: <59 Unhealthy dietary pattern with much room for improvement. 41-50 Dietary pattern unlikely to meet recommendations for good health and room for improvement. 51-60 More healthful dietary pattern, with some room for improvement.  >60 Healthy dietary pattern, although there may be some specific behaviors that could be improved.    Nutrition Goals Re-Evaluation:   Nutrition Goals Discharge (Final Nutrition Goals Re-Evaluation):   Psychosocial: Target Goals: Acknowledge presence or absence of significant depression and/or stress, maximize coping skills, provide positive support system. Participant is able to verbalize types and ability to use techniques and skills needed for reducing stress and depression.   Education: Stress, Anxiety, and Depression - Group verbal and visual presentation to define topics covered.  Reviews how body is impacted by stress, anxiety, and depression.  Also discusses healthy ways to reduce stress and to treat/manage anxiety and depression. Written material provided at class time.   Education: Sleep Hygiene -Provides group verbal and written instruction about how sleep can affect your health.  Define sleep hygiene, discuss sleep cycles and impact of sleep habits. Review good sleep hygiene tips.   Initial Review & Psychosocial Screening:  Initial Psych Review & Screening - 10/06/23 1150       Initial Review   Current issues with Current Sleep Concerns;Current Stress Concerns;Current Anxiety/Panic    Source of Stress Concerns Unable to perform yard/household activities   health concerns     Family Dynamics   Good Support System? Yes   Pt has a good support system with her sister, BIL and nieces, friends, and church   Concerns Recent loss of significant other      Barriers    Psychosocial barriers to participate in program The patient should benefit from training in stress management and relaxation.      Screening Interventions   Interventions Encouraged to exercise;To provide support and resources with identified psychosocial needs;Provide feedback about the scores to participant    Expected Outcomes Long Term Goal: Stressors or current issues are controlled or eliminated.;Short Term goal: Identification and review with participant of any Quality of Life or Depression concerns found by scoring the questionnaire.;Long Term goal: The participant improves quality of Life and PHQ9 Scores as seen by post scores and/or verbalization of changes          Quality of Life Scores:   Quality of Life - 10/06/23 1416       Quality of Life   Select Quality of Life      Quality of Life Scores   Health/Function Pre 19.71 %    Socioeconomic Pre 30 %    Psych/Spiritual Pre 21.43 %    Family Pre 20 %    GLOBAL Pre 22.45 %         Scores of 19 and below usually indicate a poorer quality of life in these areas.  A difference of  2-3 points is a clinically meaningful difference.  A difference of 2-3 points in the total score of the Quality of Life Index has been associated with significant improvement  in overall quality of life, self-image, physical symptoms, and general health in studies assessing change in quality of life.  PHQ-9: Review Flowsheet  More data exists      10/06/2023 06/19/2017 06/09/2017 03/10/2017 12/02/2016  Depression screen PHQ 2/9  Decreased Interest 0 0 0 0 0  Down, Depressed, Hopeless 0 0 0 0 0  PHQ - 2 Score 0 0 0 0 0  Altered sleeping 1 - - - -  Tired, decreased energy 1 - - - -  Change in appetite 2 - - - -  Feeling bad or failure about yourself  2 - - - -  Trouble concentrating 0 - - - -  Moving slowly or fidgety/restless 0 - - - -  Suicidal thoughts 0 - - - -  PHQ-9 Score 6 - - - -  Difficult doing work/chores Somewhat difficult - - - -    Interpretation of Total Score  Total Score Depression Severity:  1-4 = Minimal depression, 5-9 = Mild depression, 10-14 = Moderate depression, 15-19 = Moderately severe depression, 20-27 = Severe depression   Psychosocial Evaluation and Intervention:   Psychosocial Re-Evaluation:  Psychosocial Re-Evaluation     Row Name 10/15/23 1504 11/17/23 0815           Psychosocial Re-Evaluation   Current issues with Current Anxiety/Panic;Current Stress Concerns;Current Sleep Concerns Current Anxiety/Panic;Current Stress Concerns;Current Sleep Concerns      Comments Carina has not voiced any increased concerns or stressors during exercise at cardiac rehab. Will review quality of life and PHQ9 in the near future. Quality of life reviewed on 10/21/23.Murielle is dissatisfied with her health due to being over weight. Shaneeka is very motivated and excited about participating in cardiac rehab. Pete says she is still dealing with the loss of her spouse 2 years ago.Kemiya denies being depressed. Angeleen says that her sleep and appetite have been improving.      Expected Outcomes Henryetta will have controlled or decreased stress upon completion of cardiac rehab Otie will have controlled or decreased stress upon completion of cardiac rehab      Interventions Stress management education;Relaxation education;Encouraged to attend Cardiac Rehabilitation for the exercise Stress management education;Relaxation education;Encouraged to attend Cardiac Rehabilitation for the exercise      Continue Psychosocial Services  Follow up required by staff Follow up required by staff        Initial Review   Source of Stress Concerns Family Family      Comments Will continue to monitor and offer support as needed. Will continue to monitor and offer support as needed.         Psychosocial Discharge (Final Psychosocial Re-Evaluation):  Psychosocial Re-Evaluation - 11/17/23 0815       Psychosocial Re-Evaluation   Current issues with  Current Anxiety/Panic;Current Stress Concerns;Current Sleep Concerns    Comments Quality of life reviewed on 10/21/23.Daiana is dissatisfied with her health due to being over weight. Krista is very motivated and excited about participating in cardiac rehab. Solei says she is still dealing with the loss of her spouse 2 years ago.Tommy denies being depressed. Breindy says that her sleep and appetite have been improving.    Expected Outcomes Haylei will have controlled or decreased stress upon completion of cardiac rehab    Interventions Stress management education;Relaxation education;Encouraged to attend Cardiac Rehabilitation for the exercise    Continue Psychosocial Services  Follow up required by staff      Initial Review   Source of Stress Concerns Family  Comments Will continue to monitor and offer support as needed.          Vocational Rehabilitation: Provide vocational rehab assistance to qualifying candidates.   Vocational Rehab Evaluation & Intervention:  Vocational Rehab - 10/06/23 1236       Initial Vocational Rehab Evaluation & Intervention   Assessment shows need for Vocational Rehabilitation No   retired         Education: Education Goals: Education classes will be provided on a variety of topics geared toward better understanding of heart health and risk factor modification. Participant will state understanding/return demonstration of topics presented as noted by education test scores.  Learning Barriers/Preferences:  Learning Barriers/Preferences - 10/06/23 1417       Learning Barriers/Preferences   Learning Barriers None    Learning Preferences Skilled Demonstration;Video          General Cardiac Education Topics:  AED/CPR: - Group verbal and written instruction with the use of models to demonstrate the basic use of the AED with the basic ABC's of resuscitation.   Test and Procedures: - Group verbal and visual presentation and models provide information  about basic cardiac anatomy and function. Reviews the testing methods done to diagnose heart disease and the outcomes of the test results. Describes the treatment choices: Medical Management, Angioplasty, or Coronary Bypass Surgery for treating various heart conditions including Myocardial Infarction, Angina, Valve Disease, and Cardiac Arrhythmias. Written material provided at class time.   Medication Safety: - Group verbal and visual instruction to review commonly prescribed medications for heart and lung disease. Reviews the medication, class of the drug, and side effects. Includes the steps to properly store meds and maintain the prescription regimen. Written material provided at class time.   Intimacy: - Group verbal instruction through game format to discuss how heart and lung disease can affect sexual intimacy. Written material provided at class time.   Know Your Numbers and Heart Failure: - Group verbal and visual instruction to discuss disease risk factors for cardiac and pulmonary disease and treatment options.  Reviews associated critical values for Overweight/Obesity, Hypertension, Cholesterol, and Diabetes.  Discusses basics of heart failure: signs/symptoms and treatments.  Introduces Heart Failure Zone chart for action plan for heart failure. Written material provided at class time.   Infection Prevention: - Provides verbal and written material to individual with discussion of infection control including proper hand washing and proper equipment cleaning during exercise session.   Falls Prevention: - Provides verbal and written material to individual with discussion of falls prevention and safety.   Other: -Provides group and verbal instruction on various topics (see comments)   Knowledge Questionnaire Score:  Knowledge Questionnaire Score - 10/06/23 1414       Knowledge Questionnaire Score   Pre Score 24/24          Core Components/Risk Factors/Patient Goals at  Admission:  Personal Goals and Risk Factors at Admission - 10/06/23 1207       Core Components/Risk Factors/Patient Goals on Admission    Weight Management Yes;Obesity;Weight Loss    Intervention Obesity: Provide education and appropriate resources to help participant work on and attain dietary goals.;Weight Management/Obesity: Establish reasonable short term and long term weight goals.    Admit Weight 205 lb 4 oz (93.1 kg)    Expected Outcomes Short Term: Continue to assess and modify interventions until short term weight is achieved;Long Term: Adherence to nutrition and physical activity/exercise program aimed toward attainment of established weight goal;Weight Loss: Understanding of general recommendations  for a balanced deficit meal plan, which promotes 1-2 lb weight loss per week and includes a negative energy balance of [phone] kcal/d    Diabetes Yes    Intervention Provide education about signs/symptoms and action to take for hypo/hyperglycemia.;Provide education about proper nutrition, including hydration, and aerobic/resistive exercise prescription along with prescribed medications to achieve blood glucose in normal ranges: Fasting glucose 65-99 mg/dL    Expected Outcomes Short Term: Participant verbalizes understanding of the signs/symptoms and immediate care of hyper/hypoglycemia, proper foot care and importance of medication, aerobic/resistive exercise and nutrition plan for blood glucose control.;Long Term: Attainment of HbA1C < 7%.    Hypertension Yes    Intervention Provide education on lifestyle modifcations including regular physical activity/exercise, weight management, moderate sodium restriction and increased consumption of fresh fruit, vegetables, and low fat dairy, alcohol moderation, and smoking cessation.;Monitor prescription use compliance.    Expected Outcomes Short Term: Continued assessment and intervention until BP is < 140/75mm HG in hypertensive participants. < 130/22mm  HG in hypertensive participants with diabetes, heart failure or chronic kidney disease.;Long Term: Maintenance of blood pressure at goal levels.    Lipids Yes    Intervention Provide education and support for participant on nutrition & aerobic/resistive exercise along with prescribed medications to achieve LDL 70mg , HDL >40mg .    Expected Outcomes Short Term: Participant states understanding of desired cholesterol values and is compliant with medications prescribed. Participant is following exercise prescription and nutrition guidelines.;Long Term: Cholesterol controlled with medications as prescribed, with individualized exercise RX and with personalized nutrition plan. Value goals: LDL < 70mg , HDL > 40 mg.    Stress Yes    Intervention Offer individual and/or small group education and counseling on adjustment to heart disease, stress management and health-related lifestyle change. Teach and support self-help strategies.;Refer participants experiencing significant psychosocial distress to appropriate mental health specialists for further evaluation and treatment. When possible, include family members and significant others in education/counseling sessions.    Expected Outcomes Short Term: Participant demonstrates changes in health-related behavior, relaxation and other stress management skills, ability to obtain effective social support, and compliance with psychotropic medications if prescribed.;Long Term: Emotional wellbeing is indicated by absence of clinically significant psychosocial distress or social isolation.          Education:Diabetes - Individual verbal and written instruction to review signs/symptoms of diabetes, desired ranges of glucose level fasting, after meals and with exercise. Acknowledge that pre and post exercise glucose checks will be done for 3 sessions at entry of program.   Core Components/Risk Factors/Patient Goals Review:   Goals and Risk Factor Review     Row Name  10/15/23 1510 11/17/23 0819           Core Components/Risk Factors/Patient Goals Review   Personal Goals Review Weight Management/Obesity;Stress;Lipids;Hypertension Weight Management/Obesity;Stress;Lipids;Hypertension      Review Neha started cardiac rehab on 10/14/23. Yuka did well with exercise. Vital signs and CBG's were stable. Allisa continues to do  well with exercise. Vital signs and CBG's remain stable.      Expected Outcomes Christabelle will continue to participate in cardiac rehab for exercise, nutrition and lifestyle modifications Chriselda will continue to participate in cardiac rehab for exercise, nutrition and lifestyle modifications         Core Components/Risk Factors/Patient Goals at Discharge (Final Review):   Goals and Risk Factor Review - 11/17/23 0819       Core Components/Risk Factors/Patient Goals Review   Personal Goals Review Weight Management/Obesity;Stress;Lipids;Hypertension    Review  Denia continues to do  well with exercise. Vital signs and CBG's remain stable.    Expected Outcomes Ahria will continue to participate in cardiac rehab for exercise, nutrition and lifestyle modifications          ITP Comments:  ITP Comments     Row Name 10/06/23 1038 10/14/23 1135 11/17/23 0813       ITP Comments Tracy Bihari, MD: Medical director. Intorduction to the [employer] Initial orientation packet reviewed with the patient. 30-day ITP review. Keerat started the cardiac rehab program and is off to a great start. She has many goals that she hopes to achieve through participation in the program. 30-day ITP review. Ricci has good attendance and participation with exercise at cardiac rehab        Comments: See ITP Comments

## 2023-11-18 ENCOUNTER — Encounter (HOSPITAL_COMMUNITY)
Admission: RE | Admit: 2023-11-18 | Discharge: 2023-11-18 | Disposition: A | Discharge status: Home / Self Care | Source: Ambulatory Visit | Attending: Cardiology | Admitting: Cardiology

## 2023-11-18 DIAGNOSIS — Z952 Presence of prosthetic heart valve: Secondary | ICD-10-CM

## 2023-11-20 ENCOUNTER — Encounter (HOSPITAL_COMMUNITY)
Admission: RE | Admit: 2023-11-20 | Discharge: 2023-11-20 | Disposition: A | Discharge status: Home / Self Care | Source: Ambulatory Visit | Attending: Cardiology | Admitting: Cardiology

## 2023-11-20 DIAGNOSIS — Z952 Presence of prosthetic heart valve: Secondary | ICD-10-CM | POA: Diagnosis not present

## 2023-11-23 ENCOUNTER — Encounter (HOSPITAL_COMMUNITY)
Admission: RE | Admit: 2023-11-23 | Discharge: 2023-11-23 | Disposition: A | Discharge status: Home / Self Care | Source: Ambulatory Visit | Attending: Cardiology | Admitting: Cardiology

## 2023-11-23 DIAGNOSIS — Z952 Presence of prosthetic heart valve: Secondary | ICD-10-CM | POA: Diagnosis not present

## 2023-11-25 ENCOUNTER — Encounter (HOSPITAL_COMMUNITY)
Admission: RE | Admit: 2023-11-25 | Discharge: 2023-11-25 | Disposition: A | Discharge status: Home / Self Care | Source: Ambulatory Visit | Attending: Cardiology | Admitting: Cardiology

## 2023-11-25 DIAGNOSIS — Z952 Presence of prosthetic heart valve: Secondary | ICD-10-CM

## 2023-11-27 ENCOUNTER — Encounter (HOSPITAL_COMMUNITY)
Admission: RE | Admit: 2023-11-27 | Discharge: 2023-11-27 | Disposition: A | Discharge status: Home / Self Care | Source: Ambulatory Visit | Attending: Cardiology | Admitting: Cardiology

## 2023-11-27 DIAGNOSIS — Z952 Presence of prosthetic heart valve: Secondary | ICD-10-CM | POA: Diagnosis not present

## 2023-11-30 ENCOUNTER — Encounter (HOSPITAL_COMMUNITY)
Admission: RE | Admit: 2023-11-30 | Discharge: 2023-11-30 | Disposition: A | Discharge status: Home / Self Care | Source: Ambulatory Visit | Attending: Cardiology | Admitting: Cardiology

## 2023-11-30 DIAGNOSIS — Z952 Presence of prosthetic heart valve: Secondary | ICD-10-CM

## 2023-12-02 ENCOUNTER — Encounter (HOSPITAL_COMMUNITY)
Admission: RE | Admit: 2023-12-02 | Discharge: 2023-12-02 | Disposition: A | Discharge status: Home / Self Care | Source: Ambulatory Visit | Attending: Cardiology | Admitting: Cardiology

## 2023-12-02 DIAGNOSIS — Z952 Presence of prosthetic heart valve: Secondary | ICD-10-CM

## 2023-12-04 ENCOUNTER — Encounter (HOSPITAL_COMMUNITY)
Admission: RE | Admit: 2023-12-04 | Discharge: 2023-12-04 | Disposition: A | Discharge status: Home / Self Care | Source: Ambulatory Visit | Attending: Cardiology | Admitting: Cardiology

## 2023-12-04 DIAGNOSIS — Z952 Presence of prosthetic heart valve: Secondary | ICD-10-CM | POA: Diagnosis not present

## 2023-12-07 ENCOUNTER — Encounter (HOSPITAL_COMMUNITY)
Admission: RE | Admit: 2023-12-07 | Discharge: 2023-12-07 | Disposition: A | Source: Ambulatory Visit | Attending: Cardiology

## 2023-12-07 DIAGNOSIS — Z952 Presence of prosthetic heart valve: Secondary | ICD-10-CM

## 2023-12-09 ENCOUNTER — Encounter (HOSPITAL_COMMUNITY)
Admission: RE | Admit: 2023-12-09 | Discharge: 2023-12-09 | Disposition: A | Source: Ambulatory Visit | Attending: Cardiology

## 2023-12-09 DIAGNOSIS — Z952 Presence of prosthetic heart valve: Secondary | ICD-10-CM | POA: Diagnosis not present

## 2023-12-11 ENCOUNTER — Encounter (HOSPITAL_COMMUNITY)
Admission: RE | Admit: 2023-12-11 | Discharge: 2023-12-11 | Disposition: A | Discharge status: Home / Self Care | Source: Ambulatory Visit | Attending: Cardiology | Admitting: Cardiology

## 2023-12-11 DIAGNOSIS — Z952 Presence of prosthetic heart valve: Secondary | ICD-10-CM | POA: Diagnosis not present

## 2023-12-14 ENCOUNTER — Encounter (HOSPITAL_COMMUNITY)
Admission: RE | Admit: 2023-12-14 | Discharge: 2023-12-14 | Disposition: A | Discharge status: Home / Self Care | Source: Ambulatory Visit | Attending: Cardiology | Admitting: Cardiology

## 2023-12-14 DIAGNOSIS — Z952 Presence of prosthetic heart valve: Secondary | ICD-10-CM | POA: Insufficient documentation

## 2023-12-14 DIAGNOSIS — Z48812 Encounter for surgical aftercare following surgery on the circulatory system: Secondary | ICD-10-CM | POA: Diagnosis present

## 2023-12-15 NOTE — Progress Notes (Signed)
 Cardiac Individual Treatment Plan  Patient Details  Name: Tracy Kennedy MRN: [id number] Date of Birth: [birth year] Referring Provider:   Flowsheet Row INTENSIVE CARDIAC REHAB ORIENT from 10/06/2023 in [facility]  Referring Provider Uvaldo Fusi, MD  Leldon Wilbert SAUNDERS, MD]    Initial Encounter Date:  Flowsheet Row INTENSIVE CARDIAC REHAB ORIENT from 10/06/2023 in [facility] for Heart, Vascular, [facility] Lung [facility]  Date 10/06/23    Visit Diagnosis: 08/11/23 S/P TAVR (transcatheter aortic valve replacement)  Patient's Home Medications on Admission:  Current Outpatient Medications:    ACETAMINOPHEN PO, Take by mouth 2 (two) times daily., Disp: , Rfl:    albuterol  (PROVENTIL  HFA;VENTOLIN  HFA) 108 (90 Base) MCG/ACT inhaler, Inhale 2 puffs into the lungs every 6 (six) hours as needed., Disp: 1 Inhaler, Rfl: 1   Ascorbic Acid (VITAMIN C) 1000 MG tablet, Take 1,000 mg by mouth daily., Disp: , Rfl:    aspirin 81 MG tablet, Take 81 mg by mouth daily., Disp: , Rfl:    BIOTIN PO, Take by mouth daily., Disp: , Rfl:    Blood Glucose Monitoring Suppl (ONETOUCH VERIO) W/DEVICE KIT, 1 Device by Does not apply route AC breakfast., Disp: 1 kit, Rfl: 12   CALCIUM PO, Take by mouth., Disp: , Rfl:    cetirizine  (ZYRTEC ) 10 MG tablet, TAKE 1 TABLET(10 MG) BY MOUTH DAILY (Patient not taking: Reported on 10/06/2023), Disp: 90 tablet, Rfl: 0   chlorthalidone  (HYGROTON ) 25 MG tablet, TAKE 1 TABLET(25 MG) BY MOUTH DAILY (Patient taking differently: Take 12.5 mg by mouth daily. TAKE 1 TABLET(12.5 MG) BY MOUTH DAILY), Disp: 90 tablet, Rfl: 3   Cholecalciferol (VITAMIN D3) 50 MCG (2000 UT) capsule, Take 2,000 Units by mouth daily., Disp: , Rfl:    clobetasol (TEMOVATE) 0.05 % external solution, , Disp: , Rfl:    clotrimazole -betamethasone  (LOTRISONE ) cream, Apply 1 application topically 2 (two) times daily., Disp: 30 g, Rfl: 0   colestipol  (COLESTID) 1 g tablet, , Disp: , Rfl:    Cyanocobalamin (VITAMIN B-12 PO), Take by mouth. (Patient not taking: Reported on 10/06/2023), Disp: , Rfl:    ezetimibe (ZETIA) 10 MG tablet, Take 10 mg by mouth daily., Disp: , Rfl:    famotidine (PEPCID) 20 MG tablet, Take by mouth., Disp: , Rfl:    gabapentin (NEURONTIN) 300 MG capsule, Take 300 mg by mouth 2 (two) times daily., Disp: , Rfl:    GLUCOSAMINE PO, Take by mouth., Disp: , Rfl:    ipratropium (ATROVENT ) 0.03 % nasal spray, Place 2 sprays into both nostrils 2 (two) times daily. (Patient taking differently: Place 2 sprays into both nostrils as needed for rhinitis.), Disp: 30 mL, Rfl: 12   Lancet Devices (LANCING DEVICE) MISC, Use to test blood sugar daily. One box, Disp: 1 each, Rfl: 2   Lancets MISC, Use to test blood sugar daily, Disp: 100 each, Rfl: 2   loratadine (CLARITIN) 10 MG tablet, Take by mouth., Disp: , Rfl:    losartan (COZAAR) 100 MG tablet, , Disp: , Rfl:    magnesium oxide (MAG-OX) 400 (240 Mg) MG tablet, Take 400 mg by mouth daily., Disp: , Rfl:    montelukast (SINGULAIR) 10 MG tablet, Take 10 mg by mouth at bedtime., Disp: , Rfl:    Multiple Vitamin (MULTIVITAMIN) tablet, Take 1 tablet by mouth daily., Disp: , Rfl:    niacin (NIASPAN) 750 MG CR tablet, Take 750 mg by mouth  at bedtime., Disp: , Rfl:    omeprazole  (PRILOSEC) 20 MG capsule, TAKE 1 CAPSULE(20 MG) BY MOUTH DAILY, Disp: 90 capsule, Rfl: 3   ONETOUCH VERIO test strip, TEST AS DIRECTED, Disp: 100 each, Rfl: 1   scopolamine  (TRANSDERM-SCOP) 1 MG/3DAYS, Place 1 patch (1.5 mg total) onto the skin every 3 (three) days. (Patient not taking: Reported on 10/06/2023), Disp: 10 patch, Rfl: 0   spironolactone (ALDACTONE) 25 MG tablet, Take 0.5 tablets (12.5 mg total) by mouth daily., Disp: , Rfl:    triamcinolone  (KENALOG ) 0.025 % ointment, Apply 1 application topically 2 (two) times daily., Disp: 30 g, Rfl: 0   Turmeric (QC TUMERIC COMPLEX PO), Take 1,000 mg by mouth daily.,  Disp: , Rfl:    valsartan  (DIOVAN ) 320 MG tablet, Take 1 tablet (320 mg total) by mouth daily., Disp: 90 tablet, Rfl: 3   verapamil  (VERELAN  PM) 180 MG 24 hr capsule, Take 180 mg by mouth daily., Disp: , Rfl: 4   vitamin E 1000 UNIT capsule, Take 1,000 Units by mouth daily., Disp: , Rfl:    WELCHOL  625 MG tablet, TAKE 3 TABLETS BY MOUTH TWICE DAILY WITH A MEAL (Patient not taking: Reported on 10/06/2023), Disp: 540 tablet, Rfl: 3  Current Facility-Administered Medications:    betamethasone  acetate-betamethasone  sodium phosphate (CELESTONE ) injection 3 mg, 3 mg, Intramuscular, Once, Janit Thresa HERO, DPM  Past Medical History: Past Medical History:  Diagnosis Date   Asthma    Asthma    childhood   Cardiac murmur    Diabetes mellitus    HTN (hypertension)    Microalbuminuria    Obesity     Tobacco Use: Social History   Tobacco Use  Smoking Status Never  Smokeless Tobacco Never    Labs: Review Flowsheet  More data exists      Latest Ref Rng & Units 01/15/2016 04/29/2016 12/02/2016 03/10/2017 06/09/2017  Labs for ITP Cardiac and Pulmonary Rehab  Cholestrol 100 - 199 mg/dL 842  744  799  773  809   LDL (calc) 0 - 99 mg/dL 80  821  879  860  883   HDL-C >39 mg/dL 53  53  56  53  48   Trlycerides 0 - 149 mg/dL 881  879  879  831  871   Hemoglobin A1c 4.8 - 5.6 % 5.8  5.7  6.1  6.0  6.0     Capillary Blood Glucose: Lab Results  Component Value Date   GLUCAP 111 (H) 10/14/2023   GLUCAP 125 (H) 10/14/2023     Exercise Target Goals: Exercise Program Goal: Individual exercise prescription set using results from initial 6 min walk test and THRR while considering  patient's activity barriers and safety.   Exercise Prescription Goal: Initial exercise prescription builds to 30-45 minutes a day of aerobic activity, 2-3 days per week.  Home exercise guidelines will be given to patient during program as part of exercise prescription that the participant will acknowledge.  Activity  Barriers & Risk Stratification:  Activity Barriers & Cardiac Risk Stratification - 10/06/23 1207       Activity Barriers & Cardiac Risk Stratification   Activity Barriers Back Problems;History of Falls;Balance Concerns;Other (comment)    Comments Pinched nerve- right leg. Pain in left hip down leg. Chronic low back pain.    Cardiac Risk Stratification High          6 Minute Walk:  6 Minute Walk     Row Name 10/06/23 1225  6 Minute Walk   Phase Initial     Distance 1168 feet     Walk Time 6 minutes     # of Rest Breaks 0     MPH 2.21     METS 2.21     RPE 11     Perceived Dyspnea  1     VO2 Peak 7.73     Symptoms Yes (comment)     Comments Mild shortness of breath.     Resting HR 67 bpm     Resting BP 146/84     Resting Oxygen Saturation  95 %     Exercise Oxygen Saturation  during 6 min walk 97 %     Max Ex. HR 93 bpm     Max Ex. BP 168/68     2 Minute Post BP 134/70        Oxygen Initial Assessment:   Oxygen Re-Evaluation:   Oxygen Discharge (Final Oxygen Re-Evaluation):   Initial Exercise Prescription:  Initial Exercise Prescription - 10/06/23 1400       Date of Initial Exercise RX and Referring Provider   Date 10/06/23    Referring Provider Uvaldo Fusi, MD   Shlomo Wilbert SAUNDERS, MD   Expected Discharge Date 01/01/24      Recumbant Bike   Level 1    Watts 10    Minutes 15    METs 2.2      NuStep   Level 1    SPM 85    Minutes 15    METs 2.2      Prescription Details   Frequency (times per week) 3    Duration Progress to 30 minutes of continuous aerobic without signs/symptoms of physical distress      Intensity   THRR 40-80% of Max Heartrate 60-119    Ratings of Perceived Exertion 11-13    Perceived Dyspnea 0-4      Progression   Progression Continue to progress workloads to maintain intensity without signs/symptoms of physical distress.      Resistance Training   Training Prescription Yes    Weight 2 lbs    Reps 10-15           Perform Capillary Blood Glucose checks as needed.  Exercise Prescription Changes:   Exercise Prescription Changes     Row Name 10/14/23 1034 11/04/23 1027 11/06/23 1027 11/16/23 1006 11/30/23 1031     Response to Exercise   Blood Pressure (Admit) 140/72 122/72 142/80 150/68 118/74   Blood Pressure (Exercise) 146/60 -- -- -- --   Blood Pressure (Exit) 126/72 128/70 136/70 120/60 132/60   Heart Rate (Admit) 63 bpm 72 bpm 73 bpm 60 bpm 63 bpm   Heart Rate (Exercise) 85 bpm 92 bpm 90 bpm 87 bpm 98 bpm   Heart Rate (Exit) 66 bpm 79 bpm 82 bpm 58 bpm 72 bpm   Rating of Perceived Exertion (Exercise) 12 12 12.5 13 12    Symptoms None None None None None   Comments Off to a good start with exercise. -- Reviewed home exercise guidelines with Orie. Reviewed METs with Orie. --   Duration Continue with 30 min of aerobic exercise without signs/symptoms of physical distress. Continue with 30 min of aerobic exercise without signs/symptoms of physical distress. Continue with 30 min of aerobic exercise without signs/symptoms of physical distress. Continue with 30 min of aerobic exercise without signs/symptoms of physical distress. Continue with 30 min of aerobic exercise without signs/symptoms of physical distress.  Intensity THRR unchanged THRR unchanged THRR unchanged THRR unchanged THRR unchanged     Progression   Progression Continue to progress workloads to maintain intensity without signs/symptoms of physical distress. Continue to progress workloads to maintain intensity without signs/symptoms of physical distress. Continue to progress workloads to maintain intensity without signs/symptoms of physical distress. Continue to progress workloads to maintain intensity without signs/symptoms of physical distress. Continue to progress workloads to maintain intensity without signs/symptoms of physical distress.   Average METs 1.9 2.4 2.5 2.5 2.6     Resistance Training   Training Prescription No No  Yes Yes Yes   Weight Relaxation day, no weights. Relaxation day, no weights. 2 lbs 2 lbs 2 lbs   Reps -- -- 10-15 10-15 10-15   Time -- -- 5 Minutes 5 Minutes 5 Minutes     Interval Training   Interval Training No No No No No     Recumbant Bike   Level 1 2 2 2 2    RPM 43 70 76 68 75   Watts 11 20 27 17 20    Minutes 15 15 15 15 15    METs 1.8 2.2 2.4 2.2 2.3     NuStep   Level 1 2 2 2 3    SPM 75 101 101 100 109   Minutes 15 15 15 15 15    METs 2 2.6 2.6 2.8 3     Home Exercise Plan   Plans to continue exercise at -- -- Home (comment)  Walking Home (comment)  Walking Home (comment)  Walking   Frequency -- -- Add 2 additional days to program exercise sessions. Add 2 additional days to program exercise sessions. Add 2 additional days to program exercise sessions.   Initial Home Exercises Provided -- -- 11/06/23 11/06/23 11/06/23    Row Name 12/14/23 1030             Response to Exercise   Blood Pressure (Admit) 114/70       Blood Pressure (Exit) 142/64       Heart Rate (Admit) 67 bpm       Heart Rate (Exercise) 101 bpm       Heart Rate (Exit) 67 bpm       Rating of Perceived Exertion (Exercise) 12       Symptoms None       Duration Continue with 30 min of aerobic exercise without signs/symptoms of physical distress.       Intensity THRR unchanged         Progression   Progression Continue to progress workloads to maintain intensity without signs/symptoms of physical distress.       Average METs 2.9         Resistance Training   Training Prescription Yes       Weight 2 lbs       Reps 10-15       Time 5 Minutes         Interval Training   Interval Training No         Recumbant Bike   Level 3       RPM 64       Watts 46       Minutes 15       METs 3.2         NuStep   Level 3       SPM 99       Minutes 15       METs 2.6  Home Exercise Plan   Plans to continue exercise at Home (comment)  Walking       Frequency Add 2 additional days to program  exercise sessions.       Initial Home Exercises Provided 11/06/23          Exercise Comments:   Exercise Comments     Row Name 10/14/23 1135 10/21/23 1134 11/06/23 1050 11/16/23 1102 12/07/23 1056   Exercise Comments Kharlie tolerated low intensity exercise well without symptoms. Oriented her to the exercise equipment and stretching routine. Reviewed METs activity chart. Stretching handout given. Reviewed goals with Orie. Reviewed home exercise guidelines with Orie. Reviewed METs with Orie. Reviewed goals with Orie.      Exercise Goals and Review:   Exercise Goals     Row Name 10/06/23 1207             Exercise Goals   Increase Physical Activity Yes       Intervention Provide advice, education, support and counseling about physical activity/exercise needs.;Develop an individualized exercise prescription for aerobic and resistive training based on initial evaluation findings, risk stratification, comorbidities and participant's personal goals.       Expected Outcomes Short Term: Attend rehab on a regular basis to increase amount of physical activity.;Long Term: Exercising regularly at least 3-5 days a week.;Long Term: Add in home exercise to make exercise part of routine and to increase amount of physical activity.       Increase Strength and Stamina Yes       Intervention Provide advice, education, support and counseling about physical activity/exercise needs.;Develop an individualized exercise prescription for aerobic and resistive training based on initial evaluation findings, risk stratification, comorbidities and participant's personal goals.       Expected Outcomes Short Term: Increase workloads from initial exercise prescription for resistance, speed, and METs.;Short Term: Perform resistance training exercises routinely during rehab and add in resistance training at home;Long Term: Improve cardiorespiratory fitness, muscular endurance and strength as measured by increased METs  and functional capacity ( )       Able to understand and use rate of perceived exertion (RPE) scale Yes       Intervention Provide education and explanation on how to use RPE scale       Expected Outcomes Short Term: Able to use RPE daily in rehab to express subjective intensity level;Long Term:  Able to use RPE to guide intensity level when exercising independently       Knowledge and understanding of Target Heart Rate Range (THRR) Yes       Intervention Provide education and explanation of THRR including how the numbers were predicted and where they are located for reference       Expected Outcomes Short Term: Able to state/look up THRR;Short Term: Able to use daily as guideline for intensity in rehab;Long Term: Able to use THRR to govern intensity when exercising independently       Able to check pulse independently Yes       Intervention Provide education and demonstration on how to check pulse in carotid and radial arteries.;Review the importance of being able to check your own pulse for safety during independent exercise       Expected Outcomes Short Term: Able to explain why pulse checking is important during independent exercise;Long Term: Able to check pulse independently and accurately       Understanding of Exercise Prescription Yes       Intervention Provide education, explanation, and written materials on  patient's individual exercise prescription       Expected Outcomes Short Term: Able to explain program exercise prescription;Long Term: Able to explain home exercise prescription to exercise independently          Exercise Goals Re-Evaluation :  Exercise Goals Re-Evaluation     Row Name 10/14/23 1135 10/21/23 1134 11/06/23 1050 12/07/23 1056       Exercise Goal Re-Evaluation   Exercise Goals Review Increase Physical Activity;Increase Strength and Stamina;Able to understand and use rate of perceived exertion (RPE) scale Increase Physical Activity;Increase Strength and  Stamina;Able to understand and use rate of perceived exertion (RPE) scale Increase Physical Activity;Increase Strength and Stamina;Able to understand and use rate of perceived exertion (RPE) scale;Understanding of Exercise Prescription;Knowledge and understanding of Target Heart Rate Range (THRR);Able to check pulse independently Increase Physical Activity;Increase Strength and Stamina;Able to understand and use rate of perceived exertion (RPE) scale;Understanding of Exercise Prescription;Knowledge and understanding of Target Heart Rate Range (THRR);Able to check pulse independently    Comments Cindee was able to understand and use RPE scale appropriately. Averleigh already feels a positive improvement since starting the cardiac rehab program. She's walking in the grocery store and using the cart for stability. She's looking into starting a walking program in her neighborhood. Reviewed exercise prescription with Orie. She is walking 15 minutes on Tuesdays and Thursdays. She plans to start Tai Chi online this weekend. She has a smart watch to monitor her pulse. Tarah is walking at least 1 day at home and her goal is at least 2 days/week. She is using her 2 lb weights. She plans to start Tai Chi this week. She attempted to increase to 3 lb weights today, but switched back to 2 lbs due to difficulty.    Expected Outcomes Progress workloads as tolerated to help increase cardiorespiratory fitness. Mariacristina will continue walking in addition to exercise at cardiac rehab. Caira will continue walking and add Tai Chi to her home exercise routine. Joury will walk 1-2 days/week, continue resistance exercises, and tai Chi in addition to exercise at cardiac rehab.       Discharge Exercise Prescription (Final Exercise Prescription Changes):  Exercise Prescription Changes - 12/14/23 1030       Response to Exercise   Blood Pressure (Admit) 114/70    Blood Pressure (Exit) 142/64    Heart Rate (Admit) 67 bpm    Heart Rate  (Exercise) 101 bpm    Heart Rate (Exit) 67 bpm    Rating of Perceived Exertion (Exercise) 12    Symptoms None    Duration Continue with 30 min of aerobic exercise without signs/symptoms of physical distress.    Intensity THRR unchanged      Progression   Progression Continue to progress workloads to maintain intensity without signs/symptoms of physical distress.    Average METs 2.9      Resistance Training   Training Prescription Yes    Weight 2 lbs    Reps 10-15    Time 5 Minutes      Interval Training   Interval Training No      Recumbant Bike   Level 3    RPM 64    Watts 46    Minutes 15    METs 3.2      NuStep   Level 3    SPM 99    Minutes 15    METs 2.6      Home Exercise Plan   Plans to continue exercise at Home (comment)  Walking   Frequency Add 2 additional days to program exercise sessions.    Initial Home Exercises Provided 11/06/23          Nutrition:  Target Goals: Understanding of nutrition guidelines, daily intake of sodium 1500mg , cholesterol 200mg , calories 30% from fat and 7% or less from saturated fats, daily to have 5 or more servings of fruits and vegetables.  Biometrics:  Pre Biometrics - 10/06/23 1037       Pre Biometrics   Waist Circumference 45.75 inches    Hip Circumference 49 inches    Waist to Hip Ratio 0.93 %    Triceps Skinfold 43 mm    % Body Fat 51.2 %    Grip Strength 14 kg    Flexibility --   Not performed. Chronic low back pain.   Single Leg Stand 7.75 seconds           Nutrition Therapy Plan and Nutrition Goals:   Nutrition Assessments:  MEDIFICTS Score Key: >=70 Need to make dietary changes  40-70 Heart Healthy Diet <= 40 Therapeutic Level Cholesterol Diet   Flowsheet Row INTENSIVE CARDIAC REHAB from 10/14/2023 in [facility] for Heart, Vascular, & Lung Health  Picture Your Plate Total Score on Admission 52   Picture Your Plate Scores: <59 Unhealthy dietary pattern with much  room for improvement. 41-50 Dietary pattern unlikely to meet recommendations for good health and room for improvement. 51-60 More healthful dietary pattern, with some room for improvement.  >60 Healthy dietary pattern, although there may be some specific behaviors that could be improved.    Nutrition Goals Re-Evaluation:   Nutrition Goals Re-Evaluation:   Nutrition Goals Discharge (Final Nutrition Goals Re-Evaluation):   Psychosocial: Target Goals: Acknowledge presence or absence of significant depression and/or stress, maximize coping skills, provide positive support system. Participant is able to verbalize types and ability to use techniques and skills needed for reducing stress and depression.  Initial Review & Psychosocial Screening:  Initial Psych Review & Screening - 10/06/23 1150       Initial Review   Current issues with Current Sleep Concerns;Current Stress Concerns;Current Anxiety/Panic    Source of Stress Concerns Unable to perform yard/household activities   health concerns     Family Dynamics   Good Support System? Yes   Pt has a good support system with her sister, BIL and nieces, friends, and church   Concerns Recent loss of significant other      Barriers   Psychosocial barriers to participate in program The patient should benefit from training in stress management and relaxation.      Screening Interventions   Interventions Encouraged to exercise;To provide support and resources with identified psychosocial needs;Provide feedback about the scores to participant    Expected Outcomes Long Term Goal: Stressors or current issues are controlled or eliminated.;Short Term goal: Identification and review with participant of any Quality of Life or Depression concerns found by scoring the questionnaire.;Long Term goal: The participant improves quality of Life and PHQ9 Scores as seen by post scores and/or verbalization of changes          Quality of Life Scores:   Quality of Life - 10/06/23 1416       Quality of Life   Select Quality of Life      Quality of Life Scores   Health/Function Pre 19.71 %    Socioeconomic Pre 30 %    Psych/Spiritual Pre 21.43 %    Family Pre 20 %  GLOBAL Pre 22.45 %         Scores of 19 and below usually indicate a poorer quality of life in these areas.  A difference of  2-3 points is a clinically meaningful difference.  A difference of 2-3 points in the total score of the Quality of Life Index has been associated with significant improvement in overall quality of life, self-image, physical symptoms, and general health in studies assessing change in quality of life.  PHQ-9: Review Flowsheet  More data exists      10/06/2023 06/19/2017 06/09/2017 03/10/2017 12/02/2016  Depression screen PHQ 2/9  Decreased Interest 0 0 0 0 0  Down, Depressed, Hopeless 0 0 0 0 0  PHQ - 2 Score 0 0 0 0 0  Altered sleeping 1 - - - -  Tired, decreased energy 1 - - - -  Change in appetite 2 - - - -  Feeling bad or failure about yourself  2 - - - -  Trouble concentrating 0 - - - -  Moving slowly or fidgety/restless 0 - - - -  Suicidal thoughts 0 - - - -  PHQ-9 Score 6 - - - -  Difficult doing work/chores Somewhat difficult - - - -   Interpretation of Total Score  Total Score Depression Severity:  1-4 = Minimal depression, 5-9 = Mild depression, 10-14 = Moderate depression, 15-19 = Moderately severe depression, 20-27 = Severe depression   Psychosocial Evaluation and Intervention:   Psychosocial Re-Evaluation:  Psychosocial Re-Evaluation     Row Name 10/15/23 1504 11/17/23 0815 12/14/23 0805         Psychosocial Re-Evaluation   Current issues with Current Anxiety/Panic;Current Stress Concerns;Current Sleep Concerns Current Anxiety/Panic;Current Stress Concerns;Current Sleep Concerns Current Anxiety/Panic;Current Stress Concerns;Current Sleep Concerns     Comments Brittne has not voiced any increased concerns or stressors during  exercise at cardiac rehab. Will review quality of life and PHQ9 in the near future. Quality of life reviewed on 10/21/23.Statia is dissatisfied with her health due to being over weight. Laelia is very motivated and excited about participating in cardiac rehab. Aliviya says she is still dealing with the loss of her spouse 2 years ago.Torrey denies being depressed. Kysa says that her sleep and appetite have been improving. Karelyn has not voiced any increase concerns or stressors during exercise at cardiac rehab.     Expected Outcomes Colbie will have controlled or decreased stress upon completion of cardiac rehab Chade will have controlled or decreased stress upon completion of cardiac rehab Dia will have controlled or decreased stress upon completion of cardiac rehab     Interventions Stress management education;Relaxation education;Encouraged to attend Cardiac Rehabilitation for the exercise Stress management education;Relaxation education;Encouraged to attend Cardiac Rehabilitation for the exercise Stress management education;Relaxation education;Encouraged to attend Cardiac Rehabilitation for the exercise     Continue Psychosocial Services  Follow up required by staff Follow up required by staff Follow up required by staff       Initial Review   Source of Stress Concerns Family Family Chronic Illness     Comments Will continue to monitor and offer support as needed. Will continue to monitor and offer support as needed. Will continue to monitor and offer support as needed.        Psychosocial Discharge (Final Psychosocial Re-Evaluation):  Psychosocial Re-Evaluation - 12/14/23 0805       Psychosocial Re-Evaluation   Current issues with Current Anxiety/Panic;Current Stress Concerns;Current Sleep Concerns    Comments Yanelis has  not voiced any increase concerns or stressors during exercise at cardiac rehab.    Expected Outcomes Julian will have controlled or decreased stress upon completion of cardiac rehab     Interventions Stress management education;Relaxation education;Encouraged to attend Cardiac Rehabilitation for the exercise    Continue Psychosocial Services  Follow up required by staff      Initial Review   Source of Stress Concerns Chronic Illness    Comments Will continue to monitor and offer support as needed.          Vocational Rehabilitation: Provide vocational rehab assistance to qualifying candidates.   Vocational Rehab Evaluation & Intervention:  Vocational Rehab - 10/06/23 1236       Initial Vocational Rehab Evaluation & Intervention   Assessment shows need for Vocational Rehabilitation No   retired         Education: Education Goals: Education classes will be provided on a weekly basis, covering required topics. Participant will state understanding/return demonstration of topics presented.    Education     Row Name 10/14/23 1100     Education   Cardiac Education Topics Pritikin   Orthoptist   Educator Dietitian   Weekly Topic One-Pot Wonders   Instruction Review Code 1- Verbalizes Understanding   Class Start Time 1146   Class Stop Time 1225   Class Time Calculation (min) 39 min    Row Name 10/16/23 1100     Education   Cardiac Education Topics Pritikin   Writer General Education   General Education Hypertension and Heart Disease   Instruction Review Code 1- Verbalizes Understanding   Class Start Time 1153   Class Stop Time 1230   Class Time Calculation (min) 37 min    Row Name 10/19/23 1000     Education   Cardiac Education Topics Pritikin   Select Workshops     Workshops   Educator Exercise Physiologist   Select Psychosocial   Psychosocial Workshop Focused Goals, Sustainable Changes   Instruction Review Code 1- Verbalizes Understanding   Class Start Time 1155   Class Stop Time 1235   Class Time Calculation (min) 40 min    Row Name 10/23/23  1000     Education   Cardiac Education Topics Pritikin   Nurse, Children's Exercise Physiologist   Select Nutrition   Nutrition Dining Out - Part 2   Instruction Review Code 1- Verbalizes Understanding   Class Start Time 1145   Class Stop Time 1228   Class Time Calculation (min) 43 min    Row Name 10/26/23 1000     Education   Cardiac Education Topics Pritikin   Psychologist, Forensic Exercise Education   Exercise Education Biomechanial Limitations   Instruction Review Code 1- Verbalizes Understanding   Class Start Time 1150   Class Stop Time 1230   Class Time Calculation (min) 40 min    Row Name 10/28/23 1100     Education   Cardiac Education Topics Pritikin   Secondary School Teacher School   Educator Nurse;Respiratory Therapist   Weekly Topic Fast Evening Meals   Instruction Review Code 1- Verbalizes Understanding   Class Start Time 1145   Class Stop Time 1219  Class Time Calculation (min) 34 min    Row Name 10/30/23 1000     Education   Cardiac Education Topics Pritikin   Select Core Videos     Core Videos   Educator Dietitian   Select Nutrition   Nutrition Vitamins and Minerals   Instruction Review Code 1- Verbalizes Understanding   Class Start Time 1145   Class Stop Time 1233   Class Time Calculation (min) 48 min    Row Name 11/02/23 1200     Education   Cardiac Education Topics Pritikin   Psychologist, Forensic Exercise Education   Exercise Education Improving Performance   Instruction Review Code 1- Verbalizes Understanding   Class Start Time 1150   Class Stop Time 1230   Class Time Calculation (min) 40 min    Row Name 11/04/23 1000     Education   Cardiac Education Topics Pritikin   Customer Service Manager   Weekly Topic International Cuisine-  Spotlight on the [employer] Zones   Instruction Review Code 1- [employer] Understanding    Row Name 11/06/23 1000     Education   Cardiac Education Topics Pritikin   Glass Blower/designer Nutrition   Nutrition Workshop Fueling a Forensic Psychologist   Instruction Review Code 1- Verbalizes Understanding   Class Start Time 1157   Class Stop Time 1230   Class Time Calculation (min) 33 min    Row Name 11/09/23 1100     Education   Cardiac Education Topics Pritikin   Geographical Information Systems Officer Psychosocial   Psychosocial Workshop Healthy Sleep for a Healthy Heart   Instruction Review Code 1- Verbalizes Understanding   Class Start Time 1153   Class Stop Time 1235   Class Time Calculation (min) 42 min    Row Name 11/11/23 1000     Education   Cardiac Education Topics Pritikin   Customer Service Manager   Weekly Topic Simple Sides and Sauces   Instruction Review Code 1- Verbalizes Understanding   Class Start Time 1155   Class Stop Time 1230   Class Time Calculation (min) 35 min    Row Name 11/13/23 1100     Education   Cardiac Education Topics Pritikin   Nurse, Children's Exercise Physiologist   Select Psychosocial   Psychosocial How Our Thoughts Can Heal Our Hearts   Instruction Review Code 1- Verbalizes Understanding   Class Start Time 1200   Class Stop Time 1240   Class Time Calculation (min) 40 min    Row Name 11/16/23 1100     Education   Cardiac Education Topics Pritikin   Hospital Doctor Education   General Education Heart Disease Risk Reduction   Instruction Review Code 1- Verbalizes Understanding   Class Start Time 1150   Class Stop Time 1228   Class Time Calculation (min) 38 min    Row Name 11/18/23 1100     Education   Cardiac  Education Topics Pritikin   [employer]  Educator Dietitian   Weekly Topic Powerhouse Plant-Based Proteins   Instruction Review Code 1- Verbalizes Understanding   Class Start Time 1145   Class Stop Time 1216   Class Time Calculation (min) 31 min    Row Name 11/20/23 1300     Education   Cardiac Education Topics Pritikin   Select Core Videos     Core Videos   Educator Dietitian   Select Nutrition   Nutrition Facts on Fat   Instruction Review Code 1- Verbalizes Understanding   Class Start Time 1144   Class Stop Time 1217   Class Time Calculation (min) 33 min    Row Name 11/23/23 1100     Education   Cardiac Education Topics Pritikin   Geographical Information Systems Officer Psychosocial   Psychosocial Workshop From Head to Heart: The Power of a Healthy Outlook   Instruction Review Code 1- Verbalizes Understanding   Class Start Time 1150   Class Stop Time 1230   Class Time Calculation (min) 40 min    Row Name 11/27/23 1300     Education   Cardiac Education Topics Pritikin   Select Workshops     Workshops   Educator Exercise Physiologist   Select Exercise   Exercise Workshop Managing Heart Disease: Your Path to a Healthier Heart   Instruction Review Code 1- Verbalizes Understanding   Class Start Time 1152   Class Stop Time 1233   Class Time Calculation (min) 41 min    Row Name 11/30/23 1100     Education   Cardiac Education Topics Pritikin   Nurse, Children's Exercise Physiologist   Select Psychosocial   Psychosocial Healthy Minds, Bodies, Hearts   Instruction Review Code 1- Verbalizes Understanding   Class Start Time 1145   Class Stop Time 1220   Class Time Calculation (min) 35 min    Row Name 12/02/23 1100     Education   Cardiac Education Topics Pritikin   Customer Service Manager   Weekly Topic Adding Flavor -  Sodium-Free   Instruction Review Code 1- Verbalizes Understanding   Class Start Time 1145   Class Stop Time 1223   Class Time Calculation (min) 38 min    Row Name 12/04/23 1000     Education   Cardiac Education Topics Pritikin   Glass Blower/designer Nutrition   Nutrition Workshop Label Reading   Instruction Review Code 1- Verbalizes Understanding   Class Start Time 1145   Class Stop Time 1220   Class Time Calculation (min) 35 min    Row Name 12/07/23 1100     Education   Cardiac Education Topics Pritikin   Select Workshops     Workshops   Educator Exercise Physiologist   Select Exercise   Exercise Workshop Location Manager and Fall Prevention   Instruction Review Code 1- Verbalizes Understanding   Class Start Time 1154   Class Stop Time 1243   Class Time Calculation (min) 49 min    Row Name 12/09/23 1100     Education   Cardiac Education Topics Pritikin   Set Designer Nurse;Respiratory Therapist   Weekly Topic Fast and Healthy Breakfasts   Instruction Review Code 1- [employer] Understanding  Class Start Time 1145   Class Stop Time 1225   Class Time Calculation (min) 40 min    Row Name 12/11/23 1000     Education   Cardiac Education Topics Pritikin   Select Core Videos     Core Videos   Educator Dietitian   Select Nutrition   Nutrition Other  Label reading   Instruction Review Code 1- Verbalizes Understanding   Class Start Time 1145   Class Stop Time 1230   Class Time Calculation (min) 45 min    Row Name 12/14/23 1100     Education   Cardiac Education Topics Pritikin   Hospital Doctor Education   General Education Metabolic Syndrome and Belly Fat   Instruction Review Code 1- Verbalizes Understanding      Core Videos: Exercise    Move It!  Clinical staff conducted group or individual video  education with verbal and written material and guidebook.  Patient learns the recommended Pritikin exercise program. Exercise with the goal of living a long, healthy life. Some of the health benefits of exercise include controlled diabetes, healthier blood pressure levels, improved cholesterol levels, improved heart and lung capacity, improved sleep, and better body composition. Everyone should speak with their doctor before starting or changing an exercise routine.  Biomechanical Limitations Clinical staff conducted group or individual video education with verbal and written material and guidebook.  Patient learns how biomechanical limitations can impact exercise and how we can mitigate and possibly overcome limitations to have an impactful and balanced exercise routine.  Body Composition Clinical staff conducted group or individual video education with verbal and written material and guidebook.  Patient learns that body composition (ratio of muscle mass to fat mass) is a key component to assessing overall fitness, rather than body weight alone. Increased fat mass, especially visceral belly fat, can put us  at increased risk for metabolic syndrome, type 2 diabetes, heart disease, and even death. It is recommended to combine diet and exercise (cardiovascular and resistance training) to improve your body composition. Seek guidance from your physician and exercise physiologist before implementing an exercise routine.  Exercise Action Plan Clinical staff conducted group or individual video education with verbal and written material and guidebook.  Patient learns the recommended strategies to achieve and enjoy long-term exercise adherence, including variety, self-motivation, self-efficacy, and positive decision making. Benefits of exercise include fitness, good health, weight management, more energy, better sleep, less stress, and overall well-being.  Medical   Heart Disease Risk Reduction Clinical staff  conducted group or individual video education with verbal and written material and guidebook.  Patient learns our heart is our most vital organ as it circulates oxygen, nutrients, white blood cells, and hormones throughout the entire body, and carries waste away. Data supports a plant-based eating plan like the Pritikin Program for its effectiveness in slowing progression of and reversing heart disease. The video provides a number of recommendations to address heart disease.   Metabolic Syndrome and Belly Fat  Clinical staff conducted group or individual video education with verbal and written material and guidebook.  Patient learns what metabolic syndrome is, how it leads to heart disease, and how one can reverse it and keep it from coming back. You have metabolic syndrome if you have 3 of the following 5 criteria: abdominal obesity, high blood pressure, high triglycerides, low HDL cholesterol, and high blood sugar.  Hypertension and Heart Disease Clinical  staff conducted group or individual video education with verbal and written material and guidebook.  Patient learns that high blood pressure, or hypertension, is very common in the [place] . Hypertension is largely due to excessive salt intake, but other important risk factors include being overweight, physical inactivity, drinking too much alcohol, smoking, and not eating enough potassium from fruits and vegetables. High blood pressure is a leading risk factor for heart attack, stroke, congestive heart failure, dementia, kidney failure, and premature death. Long-term effects of excessive salt intake include stiffening of the arteries and thickening of heart muscle and organ damage. Recommendations include ways to reduce hypertension and the risk of heart disease.  Diseases of Our Time - Focusing on Diabetes Clinical staff conducted group or individual video education with verbal and written material and guidebook.  Patient learns why the best  way to stop diseases of our time is prevention, through food and other lifestyle changes. Medicine (such as prescription pills and surgeries) is often only a Band-Aid on the problem, not a long-term solution. Most common diseases of our time include obesity, type 2 diabetes, hypertension, heart disease, and cancer. The Pritikin Program is recommended and has been proven to help reduce, reverse, and/or prevent the damaging effects of metabolic syndrome.  Nutrition   Overview of the Pritikin Eating Plan  Clinical staff conducted group or individual video education with verbal and written material and guidebook.  Patient learns about the Pritikin Eating Plan for disease risk reduction. The Pritikin Eating Plan emphasizes a wide variety of unrefined, minimally-processed carbohydrates, like fruits, vegetables, whole grains, and legumes. Go, Caution, and Stop food choices are explained. Plant-based and lean animal proteins are emphasized. Rationale provided for low sodium intake for blood pressure control, low added sugars for blood sugar stabilization, and low added fats and oils for coronary artery disease risk reduction and weight management.  Calorie Density  Clinical staff conducted group or individual video education with verbal and written material and guidebook.  Patient learns about calorie density and how it impacts the Pritikin Eating Plan. Knowing the characteristics of the food you choose will help you decide whether those foods will lead to weight gain or weight loss, and whether you want to consume more or less of them. Weight loss is usually a side effect of the Pritikin Eating Plan because of its focus on low calorie-dense foods.  Label Reading  Clinical staff conducted group or individual video education with verbal and written material and guidebook.  Patient learns about the Pritikin recommended label reading guidelines and corresponding recommendations regarding calorie density, added  sugars, sodium content, and whole grains.  Dining Out - Part 1  Clinical staff conducted group or individual video education with verbal and written material and guidebook.  Patient learns that restaurant meals can be sabotaging because they can be so high in calories, fat, sodium, and/or sugar. Patient learns recommended strategies on how to positively address this and avoid unhealthy pitfalls.  Facts on Fats  Clinical staff conducted group or individual video education with verbal and written material and guidebook.  Patient learns that lifestyle modifications can be just as effective, if not more so, as many medications for lowering your risk of heart disease. A Pritikin lifestyle can help to reduce your risk of inflammation and atherosclerosis (cholesterol build-up, or plaque, in the artery walls). Lifestyle interventions such as dietary choices and physical activity address the cause of atherosclerosis. A review of the types of fats and their impact on blood cholesterol  levels, along with dietary recommendations to reduce fat intake is also included.  Nutrition Action Plan  Clinical staff conducted group or individual video education with verbal and written material and guidebook.  Patient learns how to incorporate Pritikin recommendations into their lifestyle. Recommendations include planning and keeping personal health goals in mind as an important part of their success.  Healthy Mind-Set    Healthy Minds, Bodies, Hearts  Clinical staff conducted group or individual video education with verbal and written material and guidebook.  Patient learns how to identify when they are stressed. Video will discuss the impact of that stress, as well as the many benefits of stress management. Patient will also be introduced to stress management techniques. The way we think, act, and feel has an impact on our hearts.  How Our Thoughts Can Heal Our Hearts  Clinical staff conducted group or individual  video education with verbal and written material and guidebook.  Patient learns that negative thoughts can cause depression and anxiety. This can result in negative lifestyle behavior and serious health problems. Cognitive behavioral therapy is an effective method to help control our thoughts in order to change and improve our emotional outlook.  Additional Videos:  Exercise    Improving Performance  Clinical staff conducted group or individual video education with verbal and written material and guidebook.  Patient learns to use a non-linear approach by alternating intensity levels and lengths of time spent exercising to help burn more calories and lose more body fat. Cardiovascular exercise helps improve heart health, metabolism, hormonal balance, blood sugar control, and recovery from fatigue. Resistance training improves strength, endurance, balance, coordination, reaction time, metabolism, and muscle mass. Flexibility exercise improves circulation, posture, and balance. Seek guidance from your physician and exercise physiologist before implementing an exercise routine and learn your capabilities and proper form for all exercise.  Introduction to Yoga  Clinical staff conducted group or individual video education with verbal and written material and guidebook.  Patient learns about yoga, a discipline of the coming together of mind, breath, and body. The benefits of yoga include improved flexibility, improved range of motion, better posture and core strength, increased lung function, weight loss, and positive self-image. Yoga's heart health benefits include lowered blood pressure, healthier heart rate, decreased cholesterol and triglyceride levels, improved immune function, and reduced stress. Seek guidance from your physician and exercise physiologist before implementing an exercise routine and learn your capabilities and proper form for all exercise.  Medical   Aging: Enhancing Your Quality of  Life  Clinical staff conducted group or individual video education with verbal and written material and guidebook.  Patient learns key strategies and recommendations to stay in good physical health and enhance quality of life, such as prevention strategies, having an advocate, securing a Health Care Proxy and Power of Attorney, and keeping a list of medications and system for tracking them. It also discusses how to avoid risk for bone loss.  Biology of Weight Control  Clinical staff conducted group or individual video education with verbal and written material and guidebook.  Patient learns that weight gain occurs because we consume more calories than we burn (eating more, moving less). Even if your body weight is normal, you may have higher ratios of fat compared to muscle mass. Too much body fat puts you at increased risk for cardiovascular disease, heart attack, stroke, type 2 diabetes, and obesity-related cancers. In addition to exercise, following the Pritikin Eating Plan can help reduce your risk.  Decoding Lab Results  Clinical staff conducted group or individual video education with verbal and written material and guidebook.  Patient learns that lab test reflects one measurement whose values change over time and are influenced by many factors, including medication, stress, sleep, exercise, food, hydration, pre-existing medical conditions, and more. It is recommended to use the knowledge from this video to become more involved with your lab results and evaluate your numbers to speak with your doctor.   Diseases of Our Time - Overview  Clinical staff conducted group or individual video education with verbal and written material and guidebook.  Patient learns that according to the CDC, 50% to 70% of chronic diseases (such as obesity, type 2 diabetes, elevated lipids, hypertension, and heart disease) are avoidable through lifestyle improvements including healthier food choices, listening to  satiety cues, and increased physical activity.  Sleep Disorders Clinical staff conducted group or individual video education with verbal and written material and guidebook.  Patient learns how good quality and duration of sleep are important to overall health and well-being. Patient also learns about sleep disorders and how they impact health along with recommendations to address them, including discussing with a physician.  Nutrition  Dining Out - Part 2 Clinical staff conducted group or individual video education with verbal and written material and guidebook.  Patient learns how to plan ahead and communicate in order to maximize their dining experience in a healthy and nutritious manner. Included are recommended food choices based on the type of restaurant the patient is visiting.   Fueling a Banker conducted group or individual video education with verbal and written material and guidebook.  There is a strong connection between our food choices and our health. Diseases like obesity and type 2 diabetes are very prevalent and are in large-part due to lifestyle choices. The Pritikin Eating Plan provides plenty of food and hunger-curbing satisfaction. It is easy to follow, affordable, and helps reduce health risks.  Menu Workshop  Clinical staff conducted group or individual video education with verbal and written material and guidebook.  Patient learns that restaurant meals can sabotage health goals because they are often packed with calories, fat, sodium, and sugar. Recommendations include strategies to plan ahead and to communicate with the manager, chef, or server to help order a healthier meal.  Planning Your Eating Strategy  Clinical staff conducted group or individual video education with verbal and written material and guidebook.  Patient learns about the Pritikin Eating Plan and its benefit of reducing the risk of disease. The Pritikin Eating Plan does not focus on  calories. Instead, it emphasizes high-quality, nutrient-rich foods. By knowing the characteristics of the foods, we choose, we can determine their calorie density and make informed decisions.  Targeting Your Nutrition Priorities  Clinical staff conducted group or individual video education with verbal and written material and guidebook.  Patient learns that lifestyle habits have a tremendous impact on disease risk and progression. This video provides eating and physical activity recommendations based on your personal health goals, such as reducing LDL cholesterol, losing weight, preventing or controlling type 2 diabetes, and reducing high blood pressure.  Vitamins and Minerals  Clinical staff conducted group or individual video education with verbal and written material and guidebook.  Patient learns different ways to obtain key vitamins and minerals, including through a recommended healthy diet. It is important to discuss all supplements you take with your doctor.   Healthy Mind-Set    Smoking Cessation  Clinical staff conducted group or  individual video education with verbal and written material and guidebook.  Patient learns that cigarette smoking and tobacco addiction pose a serious health risk which affects millions of people. Stopping smoking will significantly reduce the risk of heart disease, lung disease, and many forms of cancer. Recommended strategies for quitting are covered, including working with your doctor to develop a successful plan.  Culinary   Becoming a Set Designer conducted group or individual video education with verbal and written material and guidebook.  Patient learns that cooking at home can be healthy, cost-effective, quick, and puts them in control. Keys to cooking healthy recipes will include looking at your recipe, assessing your equipment needs, planning ahead, making it simple, choosing cost-effective seasonal ingredients, and limiting the use of  added fats, salts, and sugars.  Cooking - Breakfast and Snacks  Clinical staff conducted group or individual video education with verbal and written material and guidebook.  Patient learns how important breakfast is to satiety and nutrition through the entire day. Recommendations include key foods to eat during breakfast to help stabilize blood sugar levels and to prevent overeating at meals later in the day. Planning ahead is also a key component.  Cooking - Educational Psychologist conducted group or individual video education with verbal and written material and guidebook.  Patient learns eating strategies to improve overall health, including an approach to cook more at home. Recommendations include thinking of animal protein as a side on your plate rather than center stage and focusing instead on lower calorie dense options like vegetables, fruits, whole grains, and plant-based proteins, such as beans. Making sauces in large quantities to freeze for later and leaving the skin on your vegetables are also recommended to maximize your experience.  Cooking - Healthy Salads and Dressing Clinical staff conducted group or individual video education with verbal and written material and guidebook.  Patient learns that vegetables, fruits, whole grains, and legumes are the foundations of the Pritikin Eating Plan. Recommendations include how to incorporate each of these in flavorful and healthy salads, and how to create homemade salad dressings. Proper handling of ingredients is also covered. Cooking - Soups and [employer] - Soups and Desserts Clinical staff conducted group or individual video education with verbal and written material and guidebook.  Patient learns that Pritikin soups and desserts make for easy, nutritious, and delicious snacks and meal components that are low in sodium, fat, sugar, and calorie density, while high in vitamins, minerals, and filling fiber. Recommendations  include simple and healthy ideas for soups and desserts.   Overview     The Pritikin Solution Program Overview Clinical staff conducted group or individual video education with verbal and written material and guidebook.  Patient learns that the results of the Pritikin Program have been documented in more than 100 articles published in peer-reviewed journals, and the benefits include reducing risk factors for (and, in some cases, even reversing) high cholesterol, high blood pressure, type 2 diabetes, obesity, and more! An overview of the three key pillars of the Pritikin Program will be covered: eating well, doing regular exercise, and having a healthy mind-set.  WORKSHOPS  Exercise: Exercise Basics: Building Your Action Plan Clinical staff led group instruction and group discussion with PowerPoint presentation and patient guidebook. To enhance the learning environment the use of posters, models and videos may be added. At the conclusion of this workshop, patients will comprehend the difference between physical activity and exercise, as well as the  benefits of incorporating both, into their routine. Patients will understand the FITT (Frequency, Intensity, Time, and Type) principle and how to use it to build an exercise action plan. In addition, safety concerns and other considerations for exercise and cardiac rehab will be addressed by the presenter. The purpose of this lesson is to promote a comprehensive and effective weekly exercise routine in order to improve patients' overall level of fitness.   Managing Heart Disease: Your Path to a Healthier Heart Clinical staff led group instruction and group discussion with PowerPoint presentation and patient guidebook. To enhance the learning environment the use of posters, models and videos may be added.At the conclusion of this workshop, patients will understand the anatomy and physiology of the heart. Additionally, they will understand how  Pritikin's three pillars impact the risk factors, the progression, and the management of heart disease.  The purpose of this lesson is to provide a high-level overview of the heart, heart disease, and how the Pritikin lifestyle positively impacts risk factors.  Exercise Biomechanics Clinical staff led group instruction and group discussion with PowerPoint presentation and patient guidebook. To enhance the learning environment the use of posters, models and videos may be added. Patients will learn how the structural parts of their bodies function and how these functions impact their daily activities, movement, and exercise. Patients will learn how to promote a neutral spine, learn how to manage pain, and identify ways to improve their physical movement in order to promote healthy living. The purpose of this lesson is to expose patients to common physical limitations that impact physical activity. Participants will learn practical ways to adapt and manage aches and pains, and to minimize their effect on regular exercise. Patients will learn how to maintain good posture while sitting, walking, and lifting.  Balance Training and Fall Prevention  Clinical staff led group instruction and group discussion with PowerPoint presentation and patient guidebook. To enhance the learning environment the use of posters, models and videos may be added. At the conclusion of this workshop, patients will understand the importance of their sensorimotor skills (vision, proprioception, and the vestibular system) in maintaining their ability to balance as they age. Patients will apply a variety of balancing exercises that are appropriate for their current level of function. Patients will understand the common causes for poor balance, possible solutions to these problems, and ways to modify their physical environment in order to minimize their fall risk. The purpose of this lesson is to teach patients about the  importance of maintaining balance as they age and ways to minimize their risk of falling.  WORKSHOPS   Nutrition:  Fueling a Ship Broker led group instruction and group discussion with PowerPoint presentation and patient guidebook. To enhance the learning environment the use of posters, models and videos may be added. Patients will review the foundational principles of the Pritikin Eating Plan and understand what constitutes a serving size in each of the food groups. Patients will also learn Pritikin-friendly foods that are better choices when away from home and review make-ahead meal and snack options. Calorie density will be reviewed and applied to three nutrition priorities: weight maintenance, weight loss, and weight gain. The purpose of this lesson is to reinforce (in a group setting) the key concepts around what patients are recommended to eat and how to apply these guidelines when away from home by planning and selecting Pritikin-friendly options. Patients will understand how calorie density may be adjusted for different weight management goals.  Mindful Eating  Clinical staff led group instruction and group discussion with PowerPoint presentation and patient guidebook. To enhance the learning environment the use of posters, models and videos may be added. Patients will briefly review the concepts of the Pritikin Eating Plan and the importance of low-calorie dense foods. The concept of mindful eating will be introduced as well as the importance of paying attention to internal hunger signals. Triggers for non-hunger eating and techniques for dealing with triggers will be explored. The purpose of this lesson is to provide patients with the opportunity to review the basic principles of the Pritikin Eating Plan, discuss the value of eating mindfully and how to measure internal cues of hunger and fullness using the Hunger Scale. Patients will also discuss reasons for non-hunger eating and  learn strategies to use for controlling emotional eating.  Targeting Your Nutrition Priorities Clinical staff led group instruction and group discussion with PowerPoint presentation and patient guidebook. To enhance the learning environment the use of posters, models and videos may be added. Patients will learn how to determine their genetic susceptibility to disease by reviewing their family history. Patients will gain insight into the importance of diet as part of an overall healthy lifestyle in mitigating the impact of genetics and other environmental insults. The purpose of this lesson is to provide patients with the opportunity to assess their personal nutrition priorities by looking at their family history, their own health history and current risk factors. Patients will also be able to discuss ways of prioritizing and modifying the Pritikin Eating Plan for their highest risk areas  Menu  Clinical staff led group instruction and group discussion with PowerPoint presentation and patient guidebook. To enhance the learning environment the use of posters, models and videos may be added. Using menus brought in from [employer], or printed from [employer], patients will apply the Pritikin dining out guidelines that were presented in the [employer] video. Patients will also be able to practice these guidelines in a variety of provided scenarios. The purpose of this lesson is to provide patients with the opportunity to practice hands-on learning of the Pritikin Dining Out guidelines with actual menus and practice scenarios.  Label Reading Clinical staff led group instruction and group discussion with PowerPoint presentation and patient guidebook. To enhance the learning environment the use of posters, models and videos may be added. Patients will review and discuss the Pritikin label reading guidelines presented in Pritikin's Label Reading Educational series video. Using fool  labels brought in from local grocery stores and markets, patients will apply the label reading guidelines and determine if the packaged food meet the Pritikin guidelines. The purpose of this lesson is to provide patients with the opportunity to review, discuss, and practice hands-on learning of the Pritikin Label Reading guidelines with actual packaged food labels. Cooking School  Pritikin's [employer] are designed to teach patients ways to prepare quick, simple, and affordable recipes at home. The importance of nutrition's role in chronic disease risk reduction is reflected in its emphasis in the overall Pritikin program. By learning how to prepare essential core Pritikin Eating Plan recipes, patients will increase control over what they eat; be able to customize the flavor of foods without the use of added salt, sugar, or fat; and improve the quality of the food they consume. By learning a set of core recipes which are easily assembled, quickly prepared, and affordable, patients are more likely to prepare more healthy foods at home. These workshops focus on  convenient breakfasts, simple entres, side dishes, and desserts which can be prepared with minimal effort and are consistent with nutrition recommendations for cardiovascular risk reduction. Cooking [employer] are taught by a armed forces logistics/support/administrative officer (RD) who has been trained by the [employer]. The chef or RD has a clear understanding of the importance of minimizing - if not completely eliminating - added fat, sugar, and sodium in recipes. Throughout the series of Cooking School Workshop sessions, patients will learn about healthy ingredients and efficient methods of cooking to build confidence in their capability to prepare    Cooking School weekly topics:  Adding Flavor- Sodium-Free  Fast and Healthy Breakfasts  Powerhouse Plant-Based Proteins  Satisfying Salads and Dressings  Simple Sides and  Sauces  International Cuisine-Spotlight on the [employer] Zones  Delicious Desserts  Savory Soups  [employer] - Meals in a Astronomer Appetizers and Snacks  Comforting Weekend Breakfasts  One-Pot Wonders   Fast Evening Meals  Landscape Architect Your Pritikin Plate  WORKSHOPS   Healthy Mindset (Psychosocial):  Focused Goals, Sustainable Changes Clinical staff led group instruction and group discussion with PowerPoint presentation and patient guidebook. To enhance the learning environment the use of posters, models and videos may be added. Patients will be able to apply effective goal setting strategies to establish at least one personal goal, and then take consistent, meaningful action toward that goal. They will learn to identify common barriers to achieving personal goals and develop strategies to overcome them. Patients will also gain an understanding of how our mind-set can impact our ability to achieve goals and the importance of cultivating a positive and growth-oriented mind-set. The purpose of this lesson is to provide patients with a deeper understanding of how to set and achieve personal goals, as well as the tools and strategies needed to overcome common obstacles which may arise along the way.  From Head to Heart: The Power of a Healthy Outlook  Clinical staff led group instruction and group discussion with PowerPoint presentation and patient guidebook. To enhance the learning environment the use of posters, models and videos may be added. Patients will be able to recognize and describe the impact of emotions and mood on physical health. They will discover the importance of self-care and explore self-care practices which may work for them. Patients will also learn how to utilize the 4 C's to cultivate a healthier outlook and better manage stress and challenges. The purpose of this lesson is to demonstrate to patients how a healthy outlook is an essential part of  maintaining good health, especially as they continue their cardiac rehab journey.  Healthy Sleep for a Healthy Heart Clinical staff led group instruction and group discussion with PowerPoint presentation and patient guidebook. To enhance the learning environment the use of posters, models and videos may be added. At the conclusion of this workshop, patients will be able to demonstrate knowledge of the importance of sleep to overall health, well-being, and quality of life. They will understand the symptoms of, and treatments for, common sleep disorders. Patients will also be able to identify daytime and nighttime behaviors which impact sleep, and they will be able to apply these tools to help manage sleep-related challenges. The purpose of this lesson is to provide patients with a general overview of sleep and outline the importance of quality sleep. Patients will learn about a few of the most common sleep disorders. Patients will also be introduced to the concept of "sleep hygiene,"  and discover ways to self-manage certain sleeping problems through simple daily behavior changes. Finally, the workshop will motivate patients by clarifying the links between quality sleep and their goals of heart-healthy living.   Recognizing and Reducing Stress Clinical staff led group instruction and group discussion with PowerPoint presentation and patient guidebook. To enhance the learning environment the use of posters, models and videos may be added. At the conclusion of this workshop, patients will be able to understand the types of stress reactions, differentiate between acute and chronic stress, and recognize the impact that chronic stress has on their health. They will also be able to apply different coping mechanisms, such as reframing negative self-talk. Patients will have the opportunity to practice a variety of stress management techniques, such as deep abdominal breathing, progressive muscle relaxation, and/or  guided imagery.  The purpose of this lesson is to educate patients on the role of stress in their lives and to provide healthy techniques for coping with it.  Learning Barriers/Preferences:  Learning Barriers/Preferences - 10/06/23 1417       Learning Barriers/Preferences   Learning Barriers None    Learning Preferences Skilled Demonstration;Video          Education Topics:  Knowledge Questionnaire Score:  Knowledge Questionnaire Score - 10/06/23 1414       Knowledge Questionnaire Score   Pre Score 24/24          Core Components/Risk Factors/Patient Goals at Admission:  Personal Goals and Risk Factors at Admission - 10/06/23 1207       Core Components/Risk Factors/Patient Goals on Admission    Weight Management Yes;Obesity;Weight Loss    Intervention Obesity: Provide education and appropriate resources to help participant work on and attain dietary goals.;Weight Management/Obesity: Establish reasonable short term and long term weight goals.    Admit Weight 205 lb 4 oz (93.1 kg)    Expected Outcomes Short Term: Continue to assess and modify interventions until short term weight is achieved;Long Term: Adherence to nutrition and physical activity/exercise program aimed toward attainment of established weight goal;Weight Loss: Understanding of general recommendations for a balanced deficit meal plan, which promotes 1-2 lb weight loss per week and includes a negative energy balance of [phone] kcal/d    Diabetes Yes    Intervention Provide education about signs/symptoms and action to take for hypo/hyperglycemia.;Provide education about proper nutrition, including hydration, and aerobic/resistive exercise prescription along with prescribed medications to achieve blood glucose in normal ranges: Fasting glucose 65-99 mg/dL    Expected Outcomes Short Term: Participant verbalizes understanding of the signs/symptoms and immediate care of hyper/hypoglycemia, proper foot care and importance  of medication, aerobic/resistive exercise and nutrition plan for blood glucose control.;Long Term: Attainment of HbA1C < 7%.    Hypertension Yes    Intervention Provide education on lifestyle modifcations including regular physical activity/exercise, weight management, moderate sodium restriction and increased consumption of fresh fruit, vegetables, and low fat dairy, alcohol moderation, and smoking cessation.;Monitor prescription use compliance.    Expected Outcomes Short Term: Continued assessment and intervention until BP is < 140/24mm HG in hypertensive participants. < 130/82mm HG in hypertensive participants with diabetes, heart failure or chronic kidney disease.;Long Term: Maintenance of blood pressure at goal levels.    Lipids Yes    Intervention Provide education and support for participant on nutrition & aerobic/resistive exercise along with prescribed medications to achieve LDL 70mg , HDL >40mg .    Expected Outcomes Short Term: Participant states understanding of desired cholesterol values and is compliant with medications prescribed. Participant  is following exercise prescription and nutrition guidelines.;Long Term: Cholesterol controlled with medications as prescribed, with individualized exercise RX and with personalized nutrition plan. Value goals: LDL < 70mg , HDL > 40 mg.    Stress Yes    Intervention Offer individual and/or small group education and counseling on adjustment to heart disease, stress management and health-related lifestyle change. Teach and support self-help strategies.;Refer participants experiencing significant psychosocial distress to appropriate mental health specialists for further evaluation and treatment. When possible, include family members and significant others in education/counseling sessions.    Expected Outcomes Short Term: Participant demonstrates changes in health-related behavior, relaxation and other stress management skills, ability to obtain effective  social support, and compliance with psychotropic medications if prescribed.;Long Term: Emotional wellbeing is indicated by absence of clinically significant psychosocial distress or social isolation.          Core Components/Risk Factors/Patient Goals Review:   Goals and Risk Factor Review     Row Name 10/15/23 1510 11/17/23 0819 12/14/23 0807         Core Components/Risk Factors/Patient Goals Review   Personal Goals Review Weight Management/Obesity;Stress;Lipids;Hypertension Weight Management/Obesity;Stress;Lipids;Hypertension Weight Management/Obesity;Stress;Lipids;Hypertension     Review Toni started cardiac rehab on 10/14/23. Laurna did well with exercise. Vital signs and CBG's were stable. Shealyn continues to do  well with exercise. Vital signs and CBG's remain stable. Dymphna continues to do  well with exercise. Vital signs and CBG's remain stable. Sylina has had some complaints of muskuloskeletal pain during exercise at cardiac rehab. Scarlet has gained 2.9 kg since starting the program.     Expected Outcomes Eeva will continue to participate in cardiac rehab for exercise, nutrition and lifestyle modifications Raine will continue to participate in cardiac rehab for exercise, nutrition and lifestyle modifications Lynnie will continue to participate in cardiac rehab for exercise, nutrition and lifestyle modifications        Core Components/Risk Factors/Patient Goals at Discharge (Final Review):   Goals and Risk Factor Review - 12/14/23 0807       Core Components/Risk Factors/Patient Goals Review   Personal Goals Review Weight Management/Obesity;Stress;Lipids;Hypertension    Review Shannen continues to do  well with exercise. Vital signs and CBG's remain stable. Rosalena has had some complaints of muskuloskeletal pain during exercise at cardiac rehab. Riven has gained 2.9 kg since starting the program.    Expected Outcomes Nema will continue to participate in cardiac rehab for exercise,  nutrition and lifestyle modifications          ITP Comments:  ITP Comments     Row Name 10/06/23 1038 10/14/23 1135 11/17/23 0813 12/14/23 0804     ITP Comments Wilbert Bihari, MD: Medical director. Intorduction to the [employer] Initial orientation packet reviewed with the patient. 30-day ITP review. Jamar started the cardiac rehab program and is off to a great start. She has many goals that she hopes to achieve through participation in the program. 30-day ITP review. Sheryle has good attendance and participation with exercise at cardiac rehab 30-day ITP review. Miesha continues to have  good attendance and participation with exercise at cardiac rehab       Comments: See ITP Comments

## 2023-12-16 ENCOUNTER — Encounter (HOSPITAL_COMMUNITY)
Admission: RE | Admit: 2023-12-16 | Discharge: 2023-12-16 | Disposition: A | Discharge status: Home / Self Care | Source: Ambulatory Visit | Attending: Cardiology | Admitting: Cardiology

## 2023-12-16 DIAGNOSIS — Z952 Presence of prosthetic heart valve: Secondary | ICD-10-CM

## 2023-12-16 DIAGNOSIS — Z48812 Encounter for surgical aftercare following surgery on the circulatory system: Secondary | ICD-10-CM | POA: Diagnosis not present

## 2023-12-18 ENCOUNTER — Encounter (HOSPITAL_COMMUNITY)
Admission: RE | Admit: 2023-12-18 | Discharge: 2023-12-18 | Disposition: A | Discharge status: Home / Self Care | Source: Ambulatory Visit | Attending: Cardiology | Admitting: Cardiology

## 2023-12-18 DIAGNOSIS — Z48812 Encounter for surgical aftercare following surgery on the circulatory system: Secondary | ICD-10-CM | POA: Diagnosis not present

## 2023-12-18 DIAGNOSIS — Z952 Presence of prosthetic heart valve: Secondary | ICD-10-CM

## 2023-12-21 ENCOUNTER — Encounter (HOSPITAL_COMMUNITY)
Admission: RE | Admit: 2023-12-21 | Discharge: 2023-12-21 | Disposition: A | Discharge status: Home / Self Care | Source: Ambulatory Visit | Attending: Cardiology | Admitting: Cardiology

## 2023-12-21 DIAGNOSIS — Z48812 Encounter for surgical aftercare following surgery on the circulatory system: Secondary | ICD-10-CM | POA: Diagnosis not present

## 2023-12-21 DIAGNOSIS — Z952 Presence of prosthetic heart valve: Secondary | ICD-10-CM

## 2023-12-23 ENCOUNTER — Encounter (HOSPITAL_COMMUNITY)
Admission: RE | Admit: 2023-12-23 | Discharge: 2023-12-23 | Disposition: A | Discharge status: Home / Self Care | Source: Ambulatory Visit | Attending: Cardiology | Admitting: Cardiology

## 2023-12-23 DIAGNOSIS — Z48812 Encounter for surgical aftercare following surgery on the circulatory system: Secondary | ICD-10-CM | POA: Diagnosis not present

## 2023-12-23 DIAGNOSIS — Z952 Presence of prosthetic heart valve: Secondary | ICD-10-CM

## 2023-12-25 ENCOUNTER — Encounter (HOSPITAL_COMMUNITY)
Admission: RE | Admit: 2023-12-25 | Discharge: 2023-12-25 | Disposition: A | Discharge status: Home / Self Care | Source: Ambulatory Visit | Attending: Cardiology | Admitting: Cardiology

## 2023-12-25 DIAGNOSIS — Z48812 Encounter for surgical aftercare following surgery on the circulatory system: Secondary | ICD-10-CM | POA: Diagnosis not present

## 2023-12-25 DIAGNOSIS — Z952 Presence of prosthetic heart valve: Secondary | ICD-10-CM

## 2023-12-28 ENCOUNTER — Encounter (HOSPITAL_COMMUNITY)
Admission: RE | Admit: 2023-12-28 | Discharge: 2023-12-28 | Disposition: A | Discharge status: Home / Self Care | Source: Ambulatory Visit | Attending: Cardiology | Admitting: Cardiology

## 2023-12-28 DIAGNOSIS — Z48812 Encounter for surgical aftercare following surgery on the circulatory system: Secondary | ICD-10-CM | POA: Diagnosis not present

## 2023-12-28 DIAGNOSIS — Z952 Presence of prosthetic heart valve: Secondary | ICD-10-CM

## 2023-12-30 ENCOUNTER — Encounter (HOSPITAL_COMMUNITY)
Admission: RE | Admit: 2023-12-30 | Discharge: 2023-12-30 | Disposition: A | Discharge status: Home / Self Care | Source: Ambulatory Visit | Attending: Cardiology | Admitting: Cardiology

## 2023-12-30 DIAGNOSIS — I10 Essential (primary) hypertension: Secondary | ICD-10-CM

## 2023-12-30 DIAGNOSIS — Z952 Presence of prosthetic heart valve: Secondary | ICD-10-CM

## 2023-12-30 DIAGNOSIS — Z48812 Encounter for surgical aftercare following surgery on the circulatory system: Secondary | ICD-10-CM | POA: Diagnosis not present

## 2023-12-30 NOTE — Progress Notes (Signed)
 Discharge Progress Report  Patient Details  Name: Pierina Schuknecht Capozzoli MRN: [id number] Date of Birth: September 23, 1950 Referring Provider:   Flowsheet Row INTENSIVE CARDIAC REHAB ORIENT from 10/06/2023 in [facility]  Referring Provider Uvaldo Fusi, MD  Leldon Wilbert SAUNDERS, MD]     Number of Visits: 72  Reason for Discharge:  Patient reached a stable level of exercise. Patient independent in their exercise. Patient has met program and personal goals.  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)  Essential hypertension  ADL UCSD:   Initial Exercise Prescription:  Initial Exercise Prescription - 10/06/23 1400       Date of Initial Exercise RX and Referring Provider   Date 10/06/23    Referring Provider Uvaldo Fusi, MD   Shlomo Wilbert SAUNDERS, MD   Expected Discharge Date 01/01/24      Recumbant Bike   Level 1    Watts 10    Minutes 15    METs 2.2      NuStep   Level 1    SPM 85    Minutes 15    METs 2.2      Prescription Details   Frequency (times per week) 3    Duration Progress to 30 minutes of continuous aerobic without signs/symptoms of physical distress      Intensity   THRR 40-80% of Max Heartrate 60-119    Ratings of Perceived Exertion 11-13    Perceived Dyspnea 0-4      Progression   Progression Continue to progress workloads to maintain intensity without signs/symptoms of physical distress.      Resistance Training   Training Prescription Yes    Weight 2 lbs    Reps 10-15          Discharge Exercise Prescription (Final Exercise Prescription Changes):  Exercise Prescription Changes - 12/14/23 1030       Response to Exercise   Blood Pressure (Admit) 114/70    Blood Pressure (Exit) 142/64    Heart Rate (Admit) 67 bpm    Heart Rate (Exercise) 101 bpm    Heart Rate (Exit) 67  bpm    Rating of Perceived Exertion (Exercise) 12    Symptoms None    Duration Continue with 30 min of aerobic exercise without signs/symptoms of physical distress.    Intensity THRR unchanged      Progression   Progression Continue to progress workloads to maintain intensity without signs/symptoms of physical distress.    Average METs 2.9      Resistance Training   Training Prescription Yes    Weight 2 lbs    Reps 10-15    Time 5 Minutes      Interval Training   Interval Training No      Recumbant Bike   Level 3    RPM 64    Watts 46    Minutes 15    METs 3.2      NuStep   Level 3    SPM 99    Minutes 15    METs 2.6      Home Exercise Plan   Plans to continue exercise at Home (comment)   Walking   Frequency Add 2 additional days to program exercise sessions.    Initial Home Exercises Provided 11/06/23  Functional Capacity:  6 Minute Walk     Row Name 10/06/23 1225 12/28/23 1051       6 Minute Walk   Phase Initial Discharge    Distance 1168 feet 1496 feet    Distance % Change -- 28.08 %    Distance Feet Change -- 328 ft    Walk Time 6 minutes 6 minutes    # of Rest Breaks 0 0    MPH 2.21 2.83    METS 2.21 2.85    RPE 11 14    Perceived Dyspnea  1 3    VO2 Peak 7.73 9.99    Symptoms Yes (comment) Yes (comment)    Comments Mild shortness of breath. Moderated shortness of breath. Left hip pain 3/10.    Resting HR 67 bpm 59 bpm    Resting BP 146/84 126/60    Resting Oxygen Saturation  95 % --    Exercise Oxygen Saturation  during 6 min walk 97 % 94 %    Max Ex. HR 93 bpm 103 bpm    Max Ex. BP 168/68 170/78    2 Minute Post BP 134/70 162/72       Psychological, QOL, Others - Outcomes: PHQ 2/9:    12/30/2023    7:32 AM 10/06/2023   11:39 AM 06/19/2017    8:27 AM 06/09/2017    8:54 AM 03/10/2017    8:54 AM  Depression screen PHQ 2/9  Decreased Interest 1 0 0 0 0  Down, Depressed, Hopeless 1 0 0 0 0  PHQ - 2 Score 2 0 0 0 0  Altered  sleeping 3 1     Tired, decreased energy 2 1     Change in appetite 3 2     Feeling bad or failure about yourself   2     Trouble concentrating 0 0     Moving slowly or fidgety/restless 0 0     Suicidal thoughts 0 0     PHQ-9 Score 10 6      Difficult doing work/chores Somewhat difficult Somewhat difficult        Data saved with a previous flowsheet row definition    Quality of Life:  Quality of Life - 12/30/23 0738       Quality of Life   Select Quality of Life      Quality of Life Scores   Health/Function Pre 19.71 %    Health/Function Post 14.14 %    Health/Function % Change -28.26 %    Socioeconomic Pre 30 %    Socioeconomic Post 30 %    Socioeconomic % Change  0 %    Psych/Spiritual Pre 21.43 %    Psych/Spiritual Post 17.14 %    Psych/Spiritual % Change -20.02 %    Family Pre 20 %    Family Post 24 %    Family % Change 20 %    GLOBAL Pre 22.45 %    GLOBAL Post 19.35 %    GLOBAL % Change -13.81 %          Personal Goals: Goals established at orientation with interventions provided to work toward goal.  Personal Goals and Risk Factors at Admission - 10/06/23 1207       Core Components/Risk Factors/Patient Goals on Admission    Weight Management Yes;Obesity;Weight Loss    Intervention Obesity: Provide education and appropriate resources to help participant work on and attain dietary goals.;Weight Management/Obesity: Establish reasonable short term and long term  weight goals.    Admit Weight 205 lb 4 oz (93.1 kg)    Expected Outcomes Short Term: Continue to assess and modify interventions until short term weight is achieved;Long Term: Adherence to nutrition and physical activity/exercise program aimed toward attainment of established weight goal;Weight Loss: Understanding of general recommendations for a balanced deficit meal plan, which promotes 1-2 lb weight loss per week and includes a negative energy balance of [phone] kcal/d    Diabetes Yes    Intervention  Provide education about signs/symptoms and action to take for hypo/hyperglycemia.;Provide education about proper nutrition, including hydration, and aerobic/resistive exercise prescription along with prescribed medications to achieve blood glucose in normal ranges: Fasting glucose 65-99 mg/dL    Expected Outcomes Short Term: Participant verbalizes understanding of the signs/symptoms and immediate care of hyper/hypoglycemia, proper foot care and importance of medication, aerobic/resistive exercise and nutrition plan for blood glucose control.;Long Term: Attainment of HbA1C < 7%.    Hypertension Yes    Intervention Provide education on lifestyle modifcations including regular physical activity/exercise, weight management, moderate sodium restriction and increased consumption of fresh fruit, vegetables, and low fat dairy, alcohol moderation, and smoking cessation.;Monitor prescription use compliance.    Expected Outcomes Short Term: Continued assessment and intervention until BP is < 140/44mm HG in hypertensive participants. < 130/50mm HG in hypertensive participants with diabetes, heart failure or chronic kidney disease.;Long Term: Maintenance of blood pressure at goal levels.    Lipids Yes    Intervention Provide education and support for participant on nutrition & aerobic/resistive exercise along with prescribed medications to achieve LDL 70mg , HDL >40mg .    Expected Outcomes Short Term: Participant states understanding of desired cholesterol values and is compliant with medications prescribed. Participant is following exercise prescription and nutrition guidelines.;Long Term: Cholesterol controlled with medications as prescribed, with individualized exercise RX and with personalized nutrition plan. Value goals: LDL < 70mg , HDL > 40 mg.    Stress Yes    Intervention Offer individual and/or small group education and counseling on adjustment to heart disease, stress management and health-related lifestyle  change. Teach and support self-help strategies.;Refer participants experiencing significant psychosocial distress to appropriate mental health specialists for further evaluation and treatment. When possible, include family members and significant others in education/counseling sessions.    Expected Outcomes Short Term: Participant demonstrates changes in health-related behavior, relaxation and other stress management skills, ability to obtain effective social support, and compliance with psychotropic medications if prescribed.;Long Term: Emotional wellbeing is indicated by absence of clinically significant psychosocial distress or social isolation.           Personal Goals Discharge:  Goals and Risk Factor Review     Row Name 10/15/23 1510 11/17/23 0819 12/14/23 0807         Core Components/Risk Factors/Patient Goals Review   Personal Goals Review Weight Management/Obesity;Stress;Lipids;Hypertension Weight Management/Obesity;Stress;Lipids;Hypertension Weight Management/Obesity;Stress;Lipids;Hypertension     Review Tyniya started cardiac rehab on 10/14/23. Jenniefer did well with exercise. Vital signs and CBG's were stable. Maurisha continues to do  well with exercise. Vital signs and CBG's remain stable. Kemisha continues to do  well with exercise. Vital signs and CBG's remain stable. Islay has had some complaints of muskuloskeletal pain during exercise at cardiac rehab. Kidada has gained 2.9 kg since starting the program.     Expected Outcomes Shonya will continue to participate in cardiac rehab for exercise, nutrition and lifestyle modifications Paloma will continue to participate in cardiac rehab for exercise, nutrition and lifestyle modifications Neha will continue to participate  in cardiac rehab for exercise, nutrition and lifestyle modifications        Exercise Goals and Review:  Exercise Goals     Row Name 10/06/23 1207             Exercise Goals   Increase Physical Activity Yes        Intervention Provide advice, education, support and counseling about physical activity/exercise needs.;Develop an individualized exercise prescription for aerobic and resistive training based on initial evaluation findings, risk stratification, comorbidities and participant's personal goals.       Expected Outcomes Short Term: Attend rehab on a regular basis to increase amount of physical activity.;Long Term: Exercising regularly at least 3-5 days a week.;Long Term: Add in home exercise to make exercise part of routine and to increase amount of physical activity.       Increase Strength and Stamina Yes       Intervention Provide advice, education, support and counseling about physical activity/exercise needs.;Develop an individualized exercise prescription for aerobic and resistive training based on initial evaluation findings, risk stratification, comorbidities and participant's personal goals.       Expected Outcomes Short Term: Increase workloads from initial exercise prescription for resistance, speed, and METs.;Short Term: Perform resistance training exercises routinely during rehab and add in resistance training at home;Long Term: Improve cardiorespiratory fitness, muscular endurance and strength as measured by increased METs and functional capacity ( )       Able to understand and use rate of perceived exertion (RPE) scale Yes       Intervention Provide education and explanation on how to use RPE scale       Expected Outcomes Short Term: Able to use RPE daily in rehab to express subjective intensity level;Long Term:  Able to use RPE to guide intensity level when exercising independently       Knowledge and understanding of Target Heart Rate Range (THRR) Yes       Intervention Provide education and explanation of THRR including how the numbers were predicted and where they are located for reference       Expected Outcomes Short Term: Able to state/look up THRR;Short Term: Able to use daily as  guideline for intensity in rehab;Long Term: Able to use THRR to govern intensity when exercising independently       Able to check pulse independently Yes       Intervention Provide education and demonstration on how to check pulse in carotid and radial arteries.;Review the importance of being able to check your own pulse for safety during independent exercise       Expected Outcomes Short Term: Able to explain why pulse checking is important during independent exercise;Long Term: Able to check pulse independently and accurately       Understanding of Exercise Prescription Yes       Intervention Provide education, explanation, and written materials on patient's individual exercise prescription       Expected Outcomes Short Term: Able to explain program exercise prescription;Long Term: Able to explain home exercise prescription to exercise independently          Exercise Goals Re-Evaluation:  Exercise Goals Re-Evaluation     Row Name 10/14/23 1135 10/21/23 1134 11/06/23 1050 12/07/23 1056 12/28/23 1104     Exercise Goal Re-Evaluation   Exercise Goals Review Increase Physical Activity;Increase Strength and Stamina;Able to understand and use rate of perceived exertion (RPE) scale Increase Physical Activity;Increase Strength and Stamina;Able to understand and use rate of perceived exertion (RPE) scale Increase  Physical Activity;Increase Strength and Stamina;Able to understand and use rate of perceived exertion (RPE) scale;Understanding of Exercise Prescription;Knowledge and understanding of Target Heart Rate Range (THRR);Able to check pulse independently Increase Physical Activity;Increase Strength and Stamina;Able to understand and use rate of perceived exertion (RPE) scale;Understanding of Exercise Prescription;Knowledge and understanding of Target Heart Rate Range (THRR);Able to check pulse independently Increase Physical Activity;Increase Strength and Stamina;Able to understand and use rate of  perceived exertion (RPE) scale;Understanding of Exercise Prescription;Knowledge and understanding of Target Heart Rate Range (THRR);Able to check pulse independently   Comments Uilani was able to understand and use RPE scale appropriately. Christell already feels a positive improvement since starting the cardiac rehab program. She's walking in the grocery store and using the cart for stability. She's looking into starting a walking program in her neighborhood. Reviewed exercise prescription with Orie. She is walking 15 minutes on Tuesdays and Thursdays. She plans to start Tai Chi online this weekend. She has a smart watch to monitor her pulse. Berlyn is walking at least 1 day at home and her goal is at least 2 days/week. She is using her 2 lb weights. She plans to start Tai Chi this week. She attempted to increase to 3 lb weights today, but switched back to 2 lbs due to difficulty. Quianna will complete the cardiac rehab program on Friday 01/01/24. She has progressed well, walking at home in addition to exercise at cardiac rehab.  She plans to continue walking and using her exercise bands and hand weights at home. She has had an increase in hip with the added walking. She is also considering joining a gym. We discussed using seated equipment if she joins a gym to help with hip pain from walking. Her functional capacity increased 28% as measured by the .   Expected Outcomes Progress workloads as tolerated to help increase cardiorespiratory fitness. Tomasina will continue walking in addition to exercise at cardiac rehab. Shardea will continue walking and add Tai Chi to her home exercise routine. Isyss will walk 1-2 days/week, continue resistance exercises, and tai Chi in addition to exercise at cardiac rehab. Hero will continue exercise at home and possibly a local gym to help maintain health and fitness gains.      Nutrition & Weight - Outcomes:  Pre Biometrics - 10/06/23 1037       Pre Biometrics   Waist  Circumference 45.75 inches    Hip Circumference 49 inches    Waist to Hip Ratio 0.93 %    Triceps Skinfold 43 mm    % Body Fat 51.2 %    Grip Strength 14 kg    Flexibility --   Not performed. Chronic low back pain.   Single Leg Stand 7.75 seconds           Nutrition:   Nutrition Discharge:   Education Questionnaire Score:  Knowledge Questionnaire Score - 10/06/23 1414       Knowledge Questionnaire Score   Pre Score 24/24          Goals reviewed with patient; copy given to patient.Pt graduates from  Intensive/Traditional cardiac rehab program 12/30/23 with completion of  36 exercise and  36 education sessions. Pt maintained good attendance and progressed nicely during their participation in rehab as evidenced by increased MET level.   Medication list reconciled. Repeat  PHQ score- 10 . Bradee has had a increase in chronic left hip pain since she has been participating in cardiac rehab. Orie plans to follow up with her  PCP regarding this. Pt has made  lifestyle changes and should be commended for her success. Ana achieved her goals during cardiac rehab.   Pt plans to continue exercise at home walking doing tai chi and will consider joining the [employer]. We are proud of Shannyn's progress!Hadassah Elpidio Quan RN BSN

## 2024-01-01 ENCOUNTER — Encounter (HOSPITAL_COMMUNITY)
Admission: RE | Admit: 2024-01-01 | Discharge: 2024-01-01 | Disposition: A | Discharge status: Home / Self Care | Source: Ambulatory Visit | Attending: Cardiology | Admitting: Cardiology

## 2024-01-01 VITALS — BP 146/76 | HR 69 | Ht 62.0 in | Wt 214.1 lb

## 2024-01-01 DIAGNOSIS — I10 Essential (primary) hypertension: Secondary | ICD-10-CM

## 2024-01-01 DIAGNOSIS — Z48812 Encounter for surgical aftercare following surgery on the circulatory system: Secondary | ICD-10-CM | POA: Diagnosis not present

## 2024-01-01 DIAGNOSIS — Z952 Presence of prosthetic heart valve: Secondary | ICD-10-CM
